# Patient Record
Sex: Male | Born: 1960
Health system: Southern US, Community
[De-identification: ages and names within clinical notes are randomized; demographics above are authoritative.]

## PROBLEM LIST (undated history)

## (undated) DIAGNOSIS — E119 Type 2 diabetes mellitus without complications: Secondary | ICD-10-CM

## (undated) DIAGNOSIS — I10 Essential (primary) hypertension: Secondary | ICD-10-CM

## (undated) DIAGNOSIS — G8929 Other chronic pain: Secondary | ICD-10-CM

## (undated) DIAGNOSIS — E785 Hyperlipidemia, unspecified: Secondary | ICD-10-CM

## (undated) DIAGNOSIS — R519 Headache, unspecified: Secondary | ICD-10-CM

## (undated) DIAGNOSIS — M797 Fibromyalgia: Secondary | ICD-10-CM

## (undated) DIAGNOSIS — F329 Major depressive disorder, single episode, unspecified: Secondary | ICD-10-CM

## (undated) DIAGNOSIS — I214 Non-ST elevation (NSTEMI) myocardial infarction: Secondary | ICD-10-CM

## (undated) DIAGNOSIS — R51 Headache: Secondary | ICD-10-CM

## (undated) DIAGNOSIS — I2510 Atherosclerotic heart disease of native coronary artery without angina pectoris: Secondary | ICD-10-CM

## (undated) DIAGNOSIS — F32A Depression, unspecified: Secondary | ICD-10-CM

## (undated) DIAGNOSIS — I213 ST elevation (STEMI) myocardial infarction of unspecified site: Secondary | ICD-10-CM

## (undated) DIAGNOSIS — M545 Low back pain, unspecified: Secondary | ICD-10-CM

## (undated) DIAGNOSIS — K219 Gastro-esophageal reflux disease without esophagitis: Secondary | ICD-10-CM

## (undated) HISTORY — DX: Fibromyalgia: M79.7

## (undated) HISTORY — PX: CORONARY STENT PLACEMENT: SHX1402

## (undated) HISTORY — DX: Headache: R51

## (undated) HISTORY — DX: Low back pain: M54.5

## (undated) HISTORY — PX: APPENDECTOMY: SHX54

## (undated) HISTORY — PX: NASAL SINUS SURGERY: SHX719

## (undated) HISTORY — DX: Other chronic pain: G89.29

## (undated) HISTORY — DX: Depression, unspecified: F32.A

## (undated) HISTORY — DX: Headache, unspecified: R51.9

## (undated) HISTORY — PX: SHOULDER SURGERY: SHX246

## (undated) HISTORY — DX: Atherosclerotic heart disease of native coronary artery without angina pectoris: I25.10

## (undated) HISTORY — DX: Major depressive disorder, single episode, unspecified: F32.9

## (undated) HISTORY — DX: Low back pain, unspecified: M54.50

## (undated) HISTORY — DX: Gastro-esophageal reflux disease without esophagitis: K21.9

## (undated) HISTORY — DX: Hyperlipidemia, unspecified: E78.5

## (undated) HISTORY — PX: KNEE SURGERY: SHX244

## (undated) HISTORY — PX: CARDIAC CATHETERIZATION: SHX172

---

## 2005-12-08 DIAGNOSIS — I2510 Atherosclerotic heart disease of native coronary artery without angina pectoris: Secondary | ICD-10-CM

## 2005-12-08 HISTORY — DX: Atherosclerotic heart disease of native coronary artery without angina pectoris: I25.10

## 2006-11-07 ENCOUNTER — Ambulatory Visit: Payer: Self-pay | Admitting: *Deleted

## 2006-11-07 ENCOUNTER — Inpatient Hospital Stay (HOSPITAL_COMMUNITY): Admission: EM | Admit: 2006-11-07 | Discharge: 2006-11-10 | Payer: Self-pay | Admitting: Internal Medicine

## 2006-11-22 ENCOUNTER — Observation Stay (HOSPITAL_COMMUNITY): Admission: AD | Admit: 2006-11-22 | Discharge: 2006-11-24 | Payer: Self-pay | Admitting: Cardiology

## 2006-11-22 ENCOUNTER — Ambulatory Visit: Payer: Self-pay | Admitting: Internal Medicine

## 2006-11-26 ENCOUNTER — Ambulatory Visit: Payer: Self-pay

## 2006-11-26 ENCOUNTER — Encounter: Payer: Self-pay | Admitting: Cardiology

## 2006-12-10 ENCOUNTER — Ambulatory Visit: Payer: Self-pay | Admitting: Cardiology

## 2007-06-15 ENCOUNTER — Ambulatory Visit: Payer: Self-pay | Admitting: Cardiology

## 2007-06-16 ENCOUNTER — Ambulatory Visit: Payer: Self-pay | Admitting: Cardiology

## 2007-12-09 HISTORY — PX: CHOLECYSTECTOMY: SHX55

## 2008-04-13 ENCOUNTER — Ambulatory Visit: Payer: Self-pay | Admitting: Cardiology

## 2008-04-14 ENCOUNTER — Encounter: Payer: Self-pay | Admitting: Cardiology

## 2008-04-18 ENCOUNTER — Ambulatory Visit: Payer: Self-pay | Admitting: Cardiology

## 2008-04-19 ENCOUNTER — Ambulatory Visit: Payer: Self-pay | Admitting: Cardiovascular Disease

## 2008-04-19 ENCOUNTER — Inpatient Hospital Stay (HOSPITAL_COMMUNITY): Admission: AD | Admit: 2008-04-19 | Discharge: 2008-04-20 | Payer: Self-pay | Admitting: Cardiology

## 2008-05-11 ENCOUNTER — Ambulatory Visit: Payer: Self-pay | Admitting: Cardiology

## 2008-05-18 ENCOUNTER — Ambulatory Visit (HOSPITAL_COMMUNITY): Payer: Self-pay | Admitting: Psychiatry

## 2008-06-09 ENCOUNTER — Encounter: Payer: Self-pay | Admitting: Cardiology

## 2008-06-18 ENCOUNTER — Encounter: Payer: Self-pay | Admitting: Cardiology

## 2008-06-19 ENCOUNTER — Ambulatory Visit (HOSPITAL_COMMUNITY): Admission: RE | Admit: 2008-06-19 | Discharge: 2008-06-19 | Payer: Self-pay | Admitting: Family Medicine

## 2008-06-20 ENCOUNTER — Encounter: Payer: Self-pay | Admitting: Cardiology

## 2008-07-05 ENCOUNTER — Encounter: Payer: Self-pay | Admitting: Cardiology

## 2008-07-06 ENCOUNTER — Ambulatory Visit (HOSPITAL_COMMUNITY): Payer: Self-pay | Admitting: Psychiatry

## 2008-07-11 ENCOUNTER — Ambulatory Visit (HOSPITAL_COMMUNITY): Payer: Self-pay | Admitting: Psychology

## 2008-08-10 ENCOUNTER — Ambulatory Visit (HOSPITAL_COMMUNITY): Payer: Self-pay | Admitting: Psychology

## 2008-09-08 ENCOUNTER — Ambulatory Visit (HOSPITAL_COMMUNITY): Payer: Self-pay | Admitting: Psychology

## 2009-05-19 ENCOUNTER — Encounter: Payer: Self-pay | Admitting: Cardiology

## 2009-06-15 DIAGNOSIS — I10 Essential (primary) hypertension: Secondary | ICD-10-CM | POA: Insufficient documentation

## 2009-06-15 DIAGNOSIS — I2510 Atherosclerotic heart disease of native coronary artery without angina pectoris: Secondary | ICD-10-CM

## 2009-06-15 DIAGNOSIS — R079 Chest pain, unspecified: Secondary | ICD-10-CM

## 2009-06-15 DIAGNOSIS — E785 Hyperlipidemia, unspecified: Secondary | ICD-10-CM

## 2009-06-15 DIAGNOSIS — F329 Major depressive disorder, single episode, unspecified: Secondary | ICD-10-CM

## 2009-06-15 DIAGNOSIS — IMO0001 Reserved for inherently not codable concepts without codable children: Secondary | ICD-10-CM

## 2009-12-24 ENCOUNTER — Ambulatory Visit: Payer: Self-pay | Admitting: Cardiology

## 2009-12-24 DIAGNOSIS — R1013 Epigastric pain: Secondary | ICD-10-CM | POA: Insufficient documentation

## 2009-12-24 DIAGNOSIS — F17200 Nicotine dependence, unspecified, uncomplicated: Secondary | ICD-10-CM | POA: Insufficient documentation

## 2010-07-30 ENCOUNTER — Ambulatory Visit (HOSPITAL_COMMUNITY): Admission: RE | Admit: 2010-07-30 | Discharge: 2010-07-30 | Payer: Self-pay | Admitting: General Surgery

## 2010-08-20 ENCOUNTER — Ambulatory Visit: Payer: Self-pay | Admitting: Cardiology

## 2010-10-10 ENCOUNTER — Telehealth (INDEPENDENT_AMBULATORY_CARE_PROVIDER_SITE_OTHER): Payer: Self-pay | Admitting: *Deleted

## 2010-10-11 ENCOUNTER — Encounter: Payer: Self-pay | Admitting: Cardiology

## 2011-01-07 NOTE — Progress Notes (Signed)
Summary: Office Visit  Office Visit   Imported By: Zachary George 12/21/2009 15:58:36  _____________________________________________________________________  External Attachment:    Type:   Image     Comment:   External Document

## 2011-01-07 NOTE — Letter (Signed)
Summary: Letter/ [facility]  Letter/ [facility]   Imported By: Dorise Hiss 10/22/2010 12:32:26  _____________________________________________________________________  External Attachment:    Type:   Image     Comment:   External Document

## 2011-01-07 NOTE — Assessment & Plan Note (Signed)
Summary: est last seen 6/09-srs  Medications Added ASPIRIN 81 MG TBEC (ASPIRIN) Take one tablet by mouth daily CRESTOR 10 MG TABS (ROSUVASTATIN CALCIUM) Take one tablet by mouth daily. METOPROLOL SUCCINATE 25 MG XR24H-TAB (METOPROLOL SUCCINATE) Take one tablet by mouth daily OMEPRAZOLE 20 MG CPDR (OMEPRAZOLE) Take 1 tablet by mouth two times a day HYDROCODONE-ACETAMINOPHEN 10-650 MG TABS (HYDROCODONE-ACETAMINOPHEN) as needed pain ADVIL 200 MG TABS (IBUPROFEN) as needed VOLTAREN 1 % GEL (DICLOFENAC SODIUM) as needed BC FAST PAIN RELIEF 650-195-33.3 MG PACK (ASPIRIN-SALICYLAMIDE-CAFFEINE) as needed      Allergies Added: AMPICILLIN  Visit Type:  Follow-up Primary Provider:  Dr. Donzetta Sprung  CC:  follow-up visit.  History of Present Illness: the patient is a 50 year old male with a history of coronary artery disease. He has a history of stent placement to the right coronary artery. He was hospitalized in May of 2009 for chest pain. The patient underwent a cardiac catheterization which showed a patent RCA stent and otherwise no obstructive disease. His ejection fraction is 50% with mild inferobasal hypokinesis. The patient is currently in NYHA class II. He denies any chest pain, orthopnea PND palpitations or syncope. Unfortunately continues to smoke up to a pack a day. He reports occasional epigastric pain which improves with eating something. He denies any presyncope or syncope.  Preventive Screening-Counseling & Management  Alcohol-Tobacco     Smoking Status: current     Packs/Day: 1.0     Year Started: 1975  Clinical Review Panels:  Echocardiogram Echocardiogram Overall left ventricular systolic function was normal.  Left ventricular ejection fraction was estimated, range being 55-65%. There were no left ventricular regional wall motion abnormalities. Left ventricular wall thickness was mildly increased.   Olga Millers, MD  11-26-2006 (11/26/2006)  Cardiac Imaging Cardiac  Cath Findings Widely patent right coronary right artery stent Plan: The etiology for the patients chest pain is not clear. He will continue with secondary risk reduction and further evaluation by his primary doctor.    Rollene Rotunda, MD [facility] (11/23/2006)    Current Problems (verified): 1)  Tobacco Abuse  (ICD-305.1) 2)  Epigastric Pain  (ICD-789.06) 3)  Depression  (ICD-311) 4)  Hypertension  (ICD-401.9) 5)  Dyslipidemia  (ICD-272.4) 6)  Fibromyalgia  (ICD-729.1) 7)  Coronary Atherosclerosis Native Coronary Artery  (ICD-414.01) 8)  Chest Pain  (ICD-786.50)  Current Medications (verified): 1)  Nitroglycerin 0.4 Mg Subl (Nitroglycerin) .... One Tablet Under Tongue Every 5 Minutes As Needed For Chest Pain---May Repeat Times Three 2)  Aspirin 81 Mg Tbec (Aspirin) .... Take One Tablet By Mouth Daily 3)  Crestor 10 Mg Tabs (Rosuvastatin Calcium) .... Take One Tablet By Mouth Daily. 4)  Metoprolol Succinate 25 Mg Xr24h-Tab (Metoprolol Succinate) .... Take One Tablet By Mouth Daily 5)  Omeprazole 20 Mg Cpdr (Omeprazole) .... Take 1 Tablet By Mouth Two Times A Day 6)  Hydrocodone-Acetaminophen 10-650 Mg Tabs (Hydrocodone-Acetaminophen) .... As Needed Pain 7)  Advil 200 Mg Tabs (Ibuprofen) .... As Needed 8)  Voltaren 1 % Gel (Diclofenac Sodium) .... As Needed 9)  Bc Fast Pain Relief 650-195-33.3 Mg Pack (Aspirin-Salicylamide-Caffeine) .... As Needed  Allergies (verified): 1)  Ampicillin  Comments:  Nurse/Medical Assistant: The patient's medications were reviewed with the patient and were updated in the Medication List. Pt brought medications to office visit.  Past History:  Past Surgical History: Last updated: 06/15/2009 Cath bare metal stenting of mid right coronary artery Dec.2007  Family History: Last updated: 06/15/2009 Father: alive 31 yrs CAD Mother: died of  CAD age 59  Social History: Last updated: 06/15/2009 Full Time Married  Tobacco Use - Yes.  Regular Exercise -  no Drug Use - no  Risk Factors: Smoking Status: current (12/24/2009) Packs/Day: 1.0 (12/24/2009)  Past Medical History: Current Problems:  DEPRESSION (ICD-311) HYPERTENSION (ICD-401.9) DYSLIPIDEMIA (ICD-272.4) FIBROMYALGIA (ICD-729.1) CORONARY ATHEROSCLEROSIS NATIVE CORONARY ARTERY (ICD-414.01) Nonobstructive coronary artery disease with widely patent right coronary artery stent 2009. Negative suboptimal exercise stress Cardiolite ejection fraction 52% in 2009. Single vessel coronary artery disease post non-ST segment myocardial infarction in bare-metal stent placement of the mid right coronary artery December 2007 Widely patent stent by relook catheterization 2 weeks later CHEST PAIN (ICD-786.50)  Social History: Packs/Day:  1.0  Review of Systems  The patient denies fatigue, malaise, fever, weight gain/loss, vision loss, decreased hearing, hoarseness, chest pain, palpitations, shortness of breath, prolonged cough, wheezing, sleep apnea, coughing up blood, abdominal pain, blood in stool, nausea, vomiting, diarrhea, heartburn, incontinence, blood in urine, muscle weakness, joint pain, leg swelling, rash, skin lesions, headache, fainting, dizziness, depression, anxiety, enlarged lymph nodes, easy bruising or bleeding, and environmental allergies.         epigastric pain improves with eating. No shortness of breath.  Vital Signs:  Patient profile:   50 year old male Height:      70 inches Weight:      216.50 pounds BMI:     31.18 Pulse rate:   84 / minute BP sitting:   168 / 83  (left arm) Cuff size:   regular  Vitals Entered By: Cyril Loosen, RN, BSN (December 24, 2009 10:38 AM)  Nutrition Counseling: Patient's BMI is greater than 25 and therefore counseled on weight management options. CC: follow-up visit   Physical Exam  Additional Exam:  General: Well-developed, well-nourished in no distress head: Normocephalic and atraumatic eyes PERRLA/EOMI intact, conjunctiva  and lids normal nose: No deformity or lesions mouth normal dentition, normal posterior pharynx neck: Supple, no JVD.  No masses, thyromegaly or abnormal cervical nodes lungs: Normal breath sounds bilaterally without wheezing.  Normal percussion heart: regular rate and rhythm with normal S1 and S2, no S3 or S4.  PMI is normal.  No pathological murmurs abdomen: Normal bowel sounds, abdomen is soft and nontender without masses, organomegaly or hernias noted.  No hepatosplenomegaly musculoskeletal: Back normal, normal gait muscle strength and tone normal pulsus: Pulse is normal in all 4 extremities Extremities: No peripheral pitting edema neurologic: Alert and oriented x 3 skin: Intact without lesions or rashes cervical nodes: No significant adenopathy psychologic: Normal affect     EKG  Procedure date:  12/24/2009  Findings:      normal sinus rhythm. Heart rate 83 beats per minute. QRS duration 78 ms. No definite ST-T wave changes.  EKG  Procedure date:  12/24/2009  Findings:      normal sinus rhythm. Normal electrocardiogram. Heart rate 80 beats per minute.  Impression & Recommendations:  Problem # 1:  CORONARY ATHEROSCLEROSIS NATIVE CORONARY ARTERY (ICD-414.01) I encouraged exercise. The patientpurchased a treadmill test recently. He reports no chest pain while exercising. His updated medication list for this problem includes:    Nitroglycerin 0.4 Mg Subl (Nitroglycerin) ..... One tablet under tongue every 5 minutes as needed for chest pain---may repeat times three    Aspirin 81 Mg Tbec (Aspirin) .Marland Kitchen... Take one tablet by mouth daily    Metoprolol Succinate 25 Mg Xr24h-tab (Metoprolol succinate) .Marland Kitchen... Take one tablet by mouth daily  Orders: EKG w/ Interpretation (93000)  Problem #  2:  HYPERTENSION (ICD-401.9) blood pressure is poorly controlled. However the last clinic visit blood pressure was within normal limits. The patient states that his blood pressure is around  140-150 mmHg systolic at [employer]. The last the patient to recheck his blood pressure several occasions and it remains elevated we will start a low dose ACE inhibitor. His updated medication list for this problem includes:    Aspirin 81 Mg Tbec (Aspirin) .Marland Kitchen... Take one tablet by mouth daily    Metoprolol Succinate 25 Mg Xr24h-tab (Metoprolol succinate) .Marland Kitchen... Take one tablet by mouth daily  Problem # 3:  TOBACCO ABUSE (ICD-305.1) I counseled the patient regarding his tobacco use. Advised him to discontinue tobacco.  Problem # 4:  EPIGASTRIC PAIN (ICD-789.06) improves with eating something. I suspect the patient has some gastritis but no evidence of angina.  Problem # 5:  DYSLIPIDEMIA (ICD-272.4) the patient is currently on Crestor. He resembled were performed. He will follow up with his primary care physician. His updated medication list for this problem includes:    Crestor 10 Mg Tabs (Rosuvastatin calcium) .Marland Kitchen... Take one tablet by mouth daily.  Patient Instructions: 1)  Your physician recommends that you continue on your current medications as directed. Please refer to the Current Medication list given to you today. 2)  Follow up in 1 year.

## 2011-01-07 NOTE — Progress Notes (Signed)
Summary: hold asa    Phone Note Other Incoming   Caller: FAX FROM [facility] Summary of Call: Wants to know if pt can d/c ASA prior to surgery.  Right shoulder surgery planned for 11/8 with Dr. Amanda Pea.  Patient has not been seen since 12-24-09, but is not due to be seen again till 12/2010 (year f/u). Hoover Brunette, LPN  October 10, 2010 3:24 PM   Follow-up for Phone Call        yes should be able to stop aspirin 5 days before surgery but to resume immediately back after surgery. The patient has a bare metal stent which was placed in 2007 so he would be at very low risk for stent thrombosis. Follow-up by: Lewayne Bunting, MD, [facility],  October 11, 2010 9:52 AM  Additional Follow-up for Phone Call Additional follow up Details #1::        Will fax note to above.   Hoover Brunette, LPN  October 11, 2010 2:29 PM

## 2011-01-07 NOTE — Consult Note (Signed)
Summary: CARDIOLOGY CONSULT/ [facility]  CARDIOLOGY CONSULT/ [facility]   Imported By: Zachary George 12/21/2009 16:00:56  _____________________________________________________________________  External Attachment:    Type:   Image     Comment:   External Document

## 2011-04-22 NOTE — Assessment & Plan Note (Signed)
[facility]                          [facility] NOTE   NAME:Howard Nelson, KOHL POLINSKY                         MRN:          [id number]  DATE:05/11/2008                            DOB:          20-Mar-1961    PRIMARY CARDIOLOGIST:  Learta Codding, M.D., [facility]   REASON FOR VISIT:  Post-hospital followup.   Howard Nelson is a 50 year old male, well-known to [place], with longstanding  history of CAD.  He recently was rehospitalized here at [facility] with  recurrent chest pain, which we felt was atypical and for which he had  normal cardiac markers on both occasions.  He also had a normal exercise  stress test following his first admission.  Given his recurrent  symptoms, however, recommendation was to transfer him directly to [facility] so as to proceed with a cardiac catheterization.   This revealed a widely patent RCA stent site with otherwise  nonobstructive CAD.  EF was 50% with mild inferobasal hypokinesis.   The patient was discharged on his usual home medication regimen.   He presents to the clinic today reporting no significant change in his  symptomatology.  He states that he was recently placed on a proton pump  inhibitor, but has not noted any palpable improvement.  He continues to  smoke, albeit down from 3 packs to 1/2 pack a day.   The patient sites fibromyalgia as the reason for his inability to work.  As previously noted, he is seeking long-term disability.   The patient denies any complications of the right groin incision site.   Electrocardiogram today reveals NSR at 69 bpm with normal axis and no  ischemic changes.   MEDICATIONS:  1. Full-dose aspirin.  2. Simvastatin 80 daily.  3. Metoprolol 25 daily.  4. Fluoxetine 40 daily.  5. Omeprazole 20 daily.  6. Gabapentin 300 b.i.d.   PHYSICAL EXAMINATION:  Blood pressure 127/74, pulse 77 and regular.  Weight 215.6.  GENERAL:  A 50 year old male sitting upright, no distress.  HEENT:   Normocephalic, atraumatic.  NECK:  Palpable bilateral carotid pulses without bruits; no JVD at 90  degrees.  LUNGS:  Clear to auscultation bilaterally.  HEART:  Regular rate and rhythm (S1 and S2), no significant murmurs.  Positive S4.  ABDOMEN:  Soft, nontender.  EXTREMITIES:  Right groin stable with no ecchymosis, hematoma, or bruit  on auscultation; palpable femoral and right posterior tibialis pulse.  NEURO:  Flat affect, but no focal deficit.   IMPRESSION:  1. Recurrent, atypical chest pain.      a.     Nonobstructive coronary artery disease with widely patent       right coronary artery stent site, by recent cardiac       catheterization.      b.     Recent negative suboptimal exercise stress Cardiolite; EF       52%.  2. Single-vessel coronary artery disease.      a.     Status post non-ST-segment myocardial infarction/bare metal       stenting of mid right coronary artery, December  2007.      b.     Widely patent stent site by relook catheterization, 2 weeks       later.      c.     Negative routine treadmill test, July 2008.  3. Chest pain syndrome.  4. Fibromyalgia.  5. Hypertension.  6. Dyslipidemia.  7. Ongoing tobacco.  8. Depression.   PLAN:  1. Reassurance with respect to Mr. Mccreadie longstanding history of      recurrent, noncardiac chest pain, particularly in light of most      recent findings by relook cardiac catheterization.  2. The patient once again strongly advised to stop smoking tobacco.  3. Aggressive lipid management, per Dr. Reuel Boom, with target LDL of 70,      or less.  4. Prescribe p.r.n. nitroglycerin.  5. Schedule return clinic followup with Dr. Andee Lineman in 1 year, or      sooner as needed.      Gene Serpe, PA-C  Electronically Signed      Learta Codding, MD,FACC  Electronically Signed   GS/MedQ  DD: 05/11/2008  DT: 05/11/2008  Job #: [id number]   cc:   Donzetta Sprung

## 2011-04-22 NOTE — Discharge Summary (Signed)
NAME:  Howard Nelson, Howard Nelson NO.:  [id number]   MEDICAL RECORD NO.:  [id number]          PATIENT TYPE:  INP   LOCATION:  3706                         FACILITY:  [facility]   PHYSICIAN:  Veverly Fells. Excell Seltzer, MD  DATE OF BIRTH:  Jan 04, 1961   DATE OF ADMISSION:  04/19/2008  DATE OF DISCHARGE:  04/20/2008                               DISCHARGE SUMMARY   PRIMARY CARDIOLOGIST:  Learta Codding, MD, [facility].   PRIMARY CARE Mayara Paulson:  Dr. Donzetta Sprung   DISCHARGE DIAGNOSIS:  Chest pain.   SECONDARY DIAGNOSES:  1. Coronary artery disease, status post bare metal stenting in the      right coronary artery in 2007.  2. Fibromyalgia.  3. Hypertension.  4. Hyperlipidemia.  5. Depression.  6. Guillain-Barre Syndrome.  7. Degenerative joint disease of the lumbar spine.  8. History of fatty liver.   ALLERGIES:  AMPICILLIN.   PROCEDURES:  Left heart cardiac catheterization.   HISTORY OF PRESENT ILLNESS:  A 50 year old Caucasian male with prior  history of CAD, status post bare metal stenting of the RCA in 2007.  He  has had a long history of atypical chest pain.  Since his stenting,  however, this has increased in intensity and frequency, prompting him to  present to the [facility] ER on Apr 13, 2008.  There, he was ruled out for  MI and ECG showed no acute changes.  Cardiology was consulted, and the  patient underwent an exercise Cardiolite stress testing, which was  negative for ischemia.  Unfortunately, the patient had recurrent chest  pain on Apr 18, 2008, and represented to [place].  Decision was made at  this point to transfer him to [facility] for evaluation and  catheterization.  The patient did rule out for MI at [facility].   HOSPITAL COURSE:  The patient underwent left heart cardiac  catheterization this morning, Apr 20, 2008, showing a widely patent RCA  stent and otherwise nonobstructive CAD.  EF was 50% with mild  inferobasal hypokinesis.  The patient has been ambulating  post-  catheterization.  He has not had any recurrent chest discomfort.  We  made no changes to his current medical regimen, and he will be  discharged home today in good condition.   DISCHARGE LABS:  None.   DISPOSITION:  The patient is being discharged to home today in good  condition.   FOLLOWUP PLANS AND APPOINTMENTS:  He is to follow with Dr. Andee Lineman on May 05, 2008, at 01:30 p.m..  He is to follow up with Dr. Reuel Boom as  previously scheduled.   DISCHARGE MEDICATIONS:  1. Aspirin 325 mg q.d.  2. Simvastatin 80 mg q.d.  3. Fluoxetine 40 mg q.d.  4. Omeprazole 20 mg q.d.  5. Gabapentin 300 mg b.i.d.  6. Toprol-XL 25 mg q.d.  7. Nitroglycerin 0.4 mg sublingual p.r.n. chest pain.   OUTSTANDING LABORATORY STUDIES:  None.   DURATION OF DISCHARGE ENCOUNTER:  40 minutes including physician time.      Nicolasa Ducking, ANP      Veverly Fells. Excell Seltzer, MD  Electronically Signed    CB/MEDQ  D:  04/20/2008  T:  04/21/2008  Job:  [id number]   cc:   Donzetta Sprung

## 2011-04-22 NOTE — Assessment & Plan Note (Signed)
[facility]                          [facility] NOTE   NAME:Galbreath, NOELLE HOOGLAND                         MRN:          [id number]  DATE:06/15/2007                            DOB:          Apr 18, 1961    HISTORY OF PRESENT ILLNESS:  The patient is a white male with history of  coronary artery disease status post PCI stenting of the right coronary  artery November 09, 2006.  The patient had repeat catheterization in late  December that showed a widely patent stent.  The patient stated that he  has been doing well.  He reports no orthopnea, PND.  No palpitations or  syncope.  He reports some atypical chest pain.   MEDICATIONS:  1. Simvastatin 80 mg daily.  2. Plavix 75 mg daily.  3. Metoprolol 25 mg p.o. daily.  4. Aspirin 325 mg daily.  5. Fluoxetine 40 mg daily.   PHYSICAL EXAMINATION:  VITAL SIGNS:  Blood pressure 123/77, Altace 71,  weight 201 pounds.  NECK:  Normal carotid upstrokes.  No carotid bruits.  LUNGS:  Clear.  Breath sounds normal.  HEART:  Rate regular rate and rhythm.  Normal S1, S2.  No murmurs, rubs  or gallops.  ABDOMEN:  Soft, nontender.  No rebound or guarding.  EXTREMITIES:  No cyanosis, clubbing or edema.   PROBLEMS:  1. Coronary artery disease.      a.     Status post PCI and stenting in the right coronary artery.      b.     Renal catheterization November 23, 2006.  2. Dyslipidemia.  3. Extensive tobacco use.  4. Chronic chest pain, atypical.  5. Depression.  6. Shoulder surgery.   PLAN:  1. The patient continues to have atypical chest pain.  Will plan on      GXT testing in the next couple of days.  2. The patient can follow up with [place] in the office.     Learta Codding, MD,FACC  Electronically Signed   GED/MedQ  DD: 06/15/2007  DT: 06/16/2007  Job #: [id number]

## 2011-04-25 NOTE — Cardiovascular Report (Signed)
NAME:  STOCKTON, NUNLEY NO.:  [id number]   MEDICAL RECORD NO.:  [id number]          PATIENT TYPE:  INP   LOCATION:  6533                         FACILITY:  [facility]   PHYSICIAN:  Veverly Fells. Excell Seltzer, MD  DATE OF BIRTH:  [birth year]   DATE OF PROCEDURE:  11/09/2006  DATE OF DISCHARGE:  11/10/2006                            CARDIAC CATHETERIZATION   PROCEDURE:  Left heart catheterization, selective coronary angiography,  left ventricular angiography, PTCA and stenting of the right coronary  artery and Angio-Seal of the right femoral artery.   INDICATIONS:  Mr. Northup is a 50 year old male who presented with a  subendocardial MI.  He was treated with the Enoxaparin and Integralin  and became pain free.  He was subsequently referred for diagnostic  cardiac catheterization.  He had a left heart catheterization  approximately 10 years ago that was normal by his report.   Procedural details, risks and indications of the procedure were  explained in detail to the patient.  Informed consent was obtained.  Right groin was prepped, draped and anesthetized with 1% lidocaine.  Using a modified Seldinger technique, a 6-French femoral arterial sheath  was placed.  Angiographic views of the left and right coronary arteries  were taken with a 6-French JL-4 catheter and 6-French JR-4 catheter  respectively.  Following native coronary angiography, an angled pigtail  catheter was inserted into the left ventricle, and pressures were  recorded.  A RAO ventriculogram was done.  A pullback across the aortic  valve was performed.   At the conclusion of the diagnostic procedure, PCI of the right coronary  artery was performed.  The patient had a 95% lesion in the mid-right  coronary artery, at the site of a RV marginal branch.  This clearly was  his culprit, as he had only modest disease in his left coronary artery.  The patient was on Integralin.  He was given 50 units per kg of Heparin.  Once  a therapeutic ACT was achieved, a 6-French JR-4 guiding catheter  was inserted.  A Cougar guidewire was inserted into the PDA branch of  the right coronary artery beyond the lesion.  The lesion was pre-dilated  with a 2.5 x 15-mm Maverick balloon up to 10 atmospheres.  Following pre-  dilatation, a 3.5 x 20- mm Liberte stent was deployed at 18 atmospheres.  There was excellent stent expansion with TIMI III flow throughout the  vessel.  Following stent deployment, the stent appeared to be very  slightly undersized, and a 4.0 x 15 Quantum Maverick was inserted and  inflated at 20 atmospheres.  There was excellent stent expansion with  TIMI III flow throughout the main branch of the right coronary artery.  The RV marginal branch was jailed, following the stent.  There was TIMI  II flow in this vessel.  As he had an excellent main branch result, I  was elected not to treat the side branch with any mechanical measures.  The patient did have TIMI II flow in the vessel, and I suspect he will  not have any significant problems  with this.  He was given intracoronary  nitroglycerin and repeated angiographic views were performed.   At the conclusion of the case, a 6-French Angio-Seal was used to seal  the arteriotomy in the right femoral artery.   FINDINGS:  Aortic pressure 112/71 with mean of 89.  Left ventricular  pressures 107/4 with an end-diastolic pressure of 16.   CORONARY ANGIOGRAPHY:  The left main stem is angiographically normal and  bifurcates into the LAD and left circumflex.   The LAD is a large-caliber vessel that courses down the left ventricular  apex.  It has no significant angiographic disease throughout it's  course.  There are two diagonal branches, the first of which is a large  caliber.  There is a 50% ostial stenosis of the first diagonal.  The  second diagonal is medium caliber and is angiographically normal.   The left circumflex system is medium caliber.  It gives off  two marginal  branches.  The first marginal is medium caliber and has a 50% ostial  stenosis.  There is normal flow throughout that vessel.  Second obtuse  marginal is angiographically normal.  The true AV groove circumflex is  small caliber.   The right coronary artery is a large dominant vessel.  It is tortuous in  its proximal portion.  It gives off a large PDA branch distally.  It  gives off a posterior AV segment that has four posterolateral branches.  The mid right coronary artery has a severe focal stenosis of the least  95%.  There is an RV marginal branch that arises from this region of  high-grade stenosis.  There is TIMI III flow throughout the vessel.   Left ventriculogram demonstrates low normal left ventricular function  with an LVEF of 55%.   ASSESSMENT:  1. Severe single-vessel coronary artery disease with high-grade focal      stenosis of the mid-right coronary artery.  2. Normal left ventricular function.   PLAN:  As described above, PCI of the mid-right coronary artery was  performed with a Liberte bare metal stent.  This was post-dilated with a  4-0 noncompliant balloon, with an excellent angiographic result.  The RV  marginal branch did have compromised flow following the procedure.  The  RV marginal branch is approximately a 2.0-mm vessel, and I elected not  to mechanically intervene on this vessel.  Will place Mr.Voris on a  nitroglycerin drip and will treat him medically.  I do not think he will  have long-term sequelae from his compromised RV marginal branch, and his  right coronary artery is a very large vessel that I did not want to risk  with treating the side branch.  He has been pretreated with clopidogrel.  He should receive dual anti-platelet therapy with aspirin and  clopidogrel.  He needs indefinite aspirin and clopidogrel for a minimum  of 30 days.  He would benefit from 1 year of clopidogrel, based on the  cure data for non-stemi.     Veverly Fells. Excell Seltzer, MD  Electronically Signed     MDC/MEDQ  D:  11/09/2006  T:  11/10/2006  Job:  [id number]

## 2011-04-25 NOTE — Cardiovascular Report (Signed)
NAME:  RAMARI, BRAY NO.:  [id number]   MEDICAL RECORD NO.:  [id number]          PATIENT TYPE:  INP   LOCATION:  3731                         FACILITY:  [facility]   PHYSICIAN:  Rollene Rotunda, MD, FACCDATE OF BIRTH:  1961-08-14   DATE OF PROCEDURE:  11/23/2006  DATE OF DISCHARGE:                            CARDIAC CATHETERIZATION   PROCEDURE:  Coronary angiogram.   INDICATIONS:  Chest pain and coronary disease, with a recent stenting.   PROCEDURAL NOTE:  Coronary angiography was performed via the right  femoral artery.  The aorta was cannulated using anterior wall puncture.  A #4-French arterial sheath was inserted via the modified Seldinger  technique.  Preformed Judkins catheters were utilized.  The patient  tolerated the procedure well and left the lab in stable condition.   RESULTS:   HEMODYNAMICS:  AO 102/86.   CORONARIES:  The left main was normal.  The LAD had ostial 25% stenosis.  There was a mid 25% stenosis.  There was a moderate-size first diagonal  which had ostial 25-30% stenosis.  Second diagonal was small and normal.  The circumflex essentially had a very large branching OM1.  There was  some proximal stenosis of 25%.  The right coronary artery is a large  dominant vessel.  There were diffuse proximal luminal irregularities.  There was a stented area across an acute marginal.  This was widely  patent.  The acute marginal was also patent.  There was a large PDA  which was normal.   LEFT VENTRICLE:  I did not cross the AO and did not do an LV injection.   CONCLUSION:  Widely patent right coronary artery stent.   PLAN:  The etiology for the patient's chest pain is not clear.  He will  continue with secondary risk reduction and further evaluation by his  primary care doctor.      Rollene Rotunda, MD, [facility]  Electronically Signed     JH/MEDQ  D:  11/23/2006  T:  11/24/2006  Job:  [id number]

## 2011-04-25 NOTE — H&P (Signed)
NAME:  Howard Nelson, Howard Nelson NO.:  [id number]   MEDICAL RECORD NO.:  [id number]          PATIENT TYPE:  INP   LOCATION:  2928                         FACILITY:  [facility]   PHYSICIAN:  Audery Amel, MD    DATE OF BIRTH:  Jun 30, 1961   DATE OF ADMISSION:  11/07/2006  DATE OF DISCHARGE:                              HISTORY & PHYSICAL   This patient was transferred from [place].   CHIEF COMPLAINT:  Chest pain radiating to bilateral arms.   HISTORY OF PRESENT ILLNESS:  The patient is a 50 year old white male  with past medical history notable for hyperlipidemia and tobacco abuse.  He was transferred for further evaluation of chest pain.  The patient  states that the pain started this a.m. after awakening at approximately  11 a.m.  He characterizes it as left-sided sharp chest pain that  radiated to his bilateral arms.  The patient also notes tingling in his  bilateral hands.  The patient took 2 Vicodin without improvement.  He  called his primary care physician who advised him to take one of his  methadone.  Again, this failed to improve his discomfort.  The pain  stuttered off and on throughout the day, and he eventually presented to  [facility] Emergency Department this evening for evaluation.  On arrival  there, his EKG was without any evidence of acute injury.  His initial  cardiac biomarkers were somewhat interesting in that his CK-MB was  normal with a troponin of 0.4 which is positive by their scale.  He  continued to have chest pain and was initiated on a nitroglycerin drip  which did somewhat help his pain.  He was transferred to [facility] for  further evaluation and management.   On arrival to the [facility] CCU, the patient is hemodynamically stable  with continued chest pain of 1/10 which is significantly improved.  Otherwise, he is without complaint.   PAST MEDICAL HISTORY:  1. Hyperlipidemia.  2. Depression.  3. Status post shoulder surgery 3 months  previous to this      presentation.  4. Chronic pain (diffuse) treated with Vicodin and methadone.  5. Left heart catheterization approximately 8 years ago interpreted as      normal as per the patient.   ALLERGIES:  AMPICILLIN.   MEDICATIONS:  1. Zocor 40 mg daily.  2. Doxycycline 100 mg b.i.d.  3. Methadone 5 mg b.i.d.  4. Lyrica 50 mg daily.  5. Vicodin 5 mg/500 p.o. q. 4 h p.r.n.  6. Effexor XR 150 mg 1 tablet b.i.d.   SOCIAL HISTORY:  The patient lives in [place] with his wife.  He  is a Curator by profession.  He endorses 45 pack years of smoking.  He  denies alcohol or illicit substance use.   FAMILY HISTORY:  His mother died from a myocardial infarction at the age  of 44 status post CABG.  His father is 7, alive and well, with diabetes  and CAD status post PCI.  He has one sister who died in a motor vehicle  accident  and one sister who has suffered 2 cerebrovascular accidents.   REVIEW OF SYSTEMS:  No fevers, chills, sweats, or adenopathy.  HEENT:  No headache, sore throat, nasal discharge, change in visual or auditory  acuity. SKIN: No rash or lesion.  CARDIOPULMONARY: As per HPI.  GU: No  frequency, urgency, or dysuria. NEUROPSYCH: No weakness, numbness, mood  disturbance.  MUSCULOSKELETAL:  Diffuse myalgias and arthralgias which  are chronic.  GI: No nausea, vomiting, diarrhea, bright red blood per  rectum, melena, hematemesis, dysphagia, GERD.  ENDOCRINE:  No polyuria,  no polydipsia, no heat or cold intolerance.  All other Review of Systems  are negative.   PHYSICAL EXAMINATION:  VITAL SIGNS: Blood pressure 101/65, heart rate  82, O2 saturation 99% on 2 liters nasal cannula.  GENERAL: Well-developed, well-nourished, no acute distress, pleasantly  conversant.  HEENT: Normocephalic and atraumatic.  EOMI.  PERRLA.  Nares patent.  Oropharynx clear without erythema or exudate.  NECK:  Supple, full range of motion, no JVD.  There is no palpable  thyromegaly.   Carotid upstrokes are equal and symmetric bilaterally  without bruits.  LYMPHADENOPATHY:  None.  CARDIOVASCULAR:  Normal S1 and S2 without audible murmurs, rubs, or  gallops.  LUNGS: Clear to auscultation bilaterally.  SKIN: Reveals no rashes or lesions.  He does have a surgical scar over  his left shoulder.  ABDOMEN: Soft, nontender, nondistended.  Positive bowel sounds.  No  hepatosplenomegaly.  GU: Normal male genitalia.  EXTREMITIES: No clubbing, cyanosis, or edema.  MUSCULOSKELETAL: No joint deformity or effusion.  As mentioned  previously, there is a scar over his left shoulder.  NEUROLOGIC: Alert and oriented x3.  Strength and sensation grossly  intact throughout.   EKG: Normal sinus rhythm with nonspecific T wave changes and no evidence  of acute injury or ischemia.   Labs from outside hospital: White count 8.1, hematocrit 43, platelet  count 239.  Sodium 135, potassium 4.1, chloride 103, CO2 25, BUN 13,  creatinine 0.9, glucose 112.  CK-MB 3.6, troponin I 0.4.   IMPRESSION:  1. Chest pain, rule out myocardial infarction.  2. Hyperlipidemia.  3. Chronic pain syndrome.  4. Depression.   PLAN:  The patient has both typical and atypical features with regards  to his chest discomfort.  It is somewhat concerning with regards to his  family history and his mother passing from myocardial infarction at age  24.  In addition, the initial cardiac biomarkers at the outside hospital  revealed a troponin of 0.4 which is positive by their scale.  Curiously,  his CK-MB is negative.  His EKG is without any evidence of acute  ischemia.  The patient will be admitted to a step-down unit for further  evaluation and management.  We will cycle cardiac biomarkers q. 8 h x3.  The patient was treated with Lovenox at the outside hospital, and we  will continue with this therapy.  He was also given a dose of aspirin  which we will continue daily.  We will initiate a IIB/IIIA inhibitor should  the patient rule in  for myocardial infarction.  We will check his fasting lipid profile and  continue him on simvastatin 40 mg once daily.  Given his risk factors,  he would certainly seem to be a high-risk patient and would likely  benefit from diagnostic heart catheterization for further evaluation.      Audery Amel, MD  Electronically Signed     SHG/MEDQ  D:  11/08/2006  T:  11/08/2006  Job:  [id number]

## 2011-04-25 NOTE — H&P (Signed)
NAME:  CORTLAND, CREHAN NO.:  [id number]   MEDICAL RECORD NO.:  [id number]          PATIENT TYPE:  INP   LOCATION:  3731                         FACILITY:  [facility]   PHYSICIAN:  Pricilla Riffle, MD, FACCDATE OF BIRTH:  [birth year]   DATE OF ADMISSION:  11/22/2006  DATE OF DISCHARGE:                              HISTORY & PHYSICAL   IDENTIFICATION:  Mr. Guiffre is a 50 year old gentleman with a history of  CAD.  He was recently admitted to [facility] and underwent placement  of a bare metal stent to the RCA.  He was just discharged on December 4.  Again, he was admitted for chest pain to the medicine service.  Cardiac  catheterization was on December 3, this showed a large LAD without  significant disease.  There was a 50% ostial lesion in the D1.  The left  circumflex was a medium caliber vessel.  First OM had a 50% ostial  stenosis.  AV groove artery with spa.  The RCA was dominant, it was  tortuous in its proximal portion and gave rise to a large PDA distally.  PLSA had 4 segments, the mid RCA had a focal tight stenosis of at least  95%.  Patient underwent PTCA and bare metal stent placement.  He was  sent home on aspirin and Plavix.  Note, he ruled in for a myocardial  infarction, though I do not have the numbers for this.   The patient was discharged home.  He said that even after discharge at  home, he was having a little bit of pain with exertion.  When he  returned to work on the 10th of December, he began having episodes,  usually mild, less than 3/10, he would not stop work for these and kept  working.  One episode was 5/10, he stopped work, it did not really  completely resolve for several hours.  Note some dizziness and fatigue.  At one point had to sit down.  No syncope and no palpitations.  These  episodes were associated with exertion.   Denied PND.   ALLERGIES:  AMPICILLIN.   MEDICATIONS:  1. Aspirin 325.  2. Plavix 75.  3. Lopressor 25 b.i.d.  4. Zocor 80 daily.  5. Effexor-XR 150 b.i.d.  6. Vicodin.  7. Methadone 5 p.r.n.  8. Chantix b.i.d.  9. Trazodone q.h.s.   PAST MEDICAL HISTORY:  1. CAD as noted.  2. Dyslipidemia.  3. Chronic pain with history of fibromyalgia.  4. Depression.   SOCIAL HISTORY:  The patient lives at home with his wife.  Quit tobacco  2 weeks ago after a 45 pack year.  Does not drink.   FAMILY HISTORY:  Mother died of coronary artery disease, had an MI and  CABG, died at 53.  Father is alive at age 20 with coronary disease.  One  sister is alive with CVA.   REVIEW OF SYSTEMS:  No sweats.  Chest pain as noted.  Dizziness.  No  syncope as noted.  Otherwise, all systems reviewed.  Negative, except  for the above problem,  except as noted.  Patient denies GE reflux.   PHYSICAL EXAMINATION:  GENERAL:  On exam, patient is currently pain  free, I just woke him from a nap.  VITAL SIGNS:  Blood pressure 129/83.  Pulse is 87.  Temperature is 98.4.  O2 saturation, on 2 liters, 97%.  HEENT:  Normocephalic, atraumatic.  PERRL.  EOMI.  Throat clear.  Nares  clear.  NECK:  JVP is normal.  No thyromegaly or bruits.  CARDIAC EXAM:  Regular rate and rhythm.  S1 and S2.  No S3.  Grade 1/6  systolic murmur.  LUNGS:  Clear to auscultation.  No rales.  ABDOMINAL EXAM:  Supple.  Nontender.  No hepatomegaly.  No masses.  EXTREMITIES:  Good distal pulses throughout.  No lower extremity edema.  Feet warm.   Chest x-ray:  No acute disease, done at [facility].   Twelve-lead EKG shows normal sinus rhythm at 73 beats per minute.  T-  wave inversion in lead 3 and F.   LABS:  Significant for a hemoglobin of 14.2, WBC of 7.1, BUN and  creatinine of 18 and 0.9, potassium of 3.7.  Initial troponin 0.01, CK-  MB of 83 and 1.3.   IMPRESSION:  Patient is a 50 year old, status post recent intervention  after an non-Q wave myocardial infarction, had 95% mid right coronary  artery lesion.  Bare metal stent was placed.  He has  been on aspirin and  Plavix.  He has had intermittent pain since he left the hospital.  It is  unusual, at this time, to have such a problem.  Would not expect  renarrowing without full occlusion.   For now, would recommend medical therapy, rule out for myocardial  infarction.  If enzymes are negative, we will need to discuss with  Tonny Bollman, question noninvasive evaluation at most.  If enzymes  become positive, of course cardiac catheterization.   We will check fasting lipid and follow hemoglobin A1c.   Continue other meds for dyslipidemia.      Pricilla Riffle, MD, [facility]  Electronically Signed     PVR/MEDQ  D:  11/22/2006  T:  11/23/2006  Job:  [id number]

## 2011-04-25 NOTE — Discharge Summary (Signed)
NAME:  Howard Nelson, Howard Nelson NO.:  [id number]   MEDICAL RECORD NO.:  [id number]          PATIENT TYPE:  INP   LOCATION:  6533                         FACILITY:  [facility]   PHYSICIAN:  Veverly Fells. Excell Seltzer, MD  DATE OF BIRTH:  [birth year]   DATE OF ADMISSION:  11/07/2006  DATE OF DISCHARGE:  11/10/2006                               DISCHARGE SUMMARY   PRIMARY CARDIOLOGIST:  Dr. Tonny Bollman.   PRINCIPAL DIAGNOSIS:  Non-ST-elevation myocardial infarction.   SECONDARY DIAGNOSES:  1. Coronary artery disease.  2. Hyperlipidemia.  3. Chronic pain syndrome.  4. Depression.  5. History of shoulder surgery.  6. Tobacco abuse.   ALLERGIES:  AMPICILLIN.   PROCEDURES:  Left heart cardiac catheterization with successful PCI and  stenting of the mid-right coronary artery with placement of a 3.5 x 20  mm Liberte bare-metal stent.   HISTORY OF PRESENT ILLNESS:  This 50 year old Caucasian male with prior  history of tobacco abuse and hyperlipidemia who was in his usual state  of health until the morning of November 07, 2006 began to experience  sharp left-sided chest pain, duration to bilateral arms that was not  improved with Vicodin or methadone, both of which he takes at home.  He  presented to [facility] in [place] and his troponin was noted to be  elevated at 0.4 with a normal CK-MB.  Decision was made to transfer him  to [facility] for further evaluation.   HOSPITAL COURSE:  The patient's CK 291, MB at 21.9, troponin I of 3.05.  He remained chest painfree over the weekend and underwent left heart  cardiac catheterization on November 09, 2006 revealing a 95% stenosis in  the mid-right coronary artery with otherwise nonobstructive coronary  artery disease.  EF was 55%.  He then underwent successful PCI and  stenting of the mid-RCA with placement of 3.5 x 20 mm Liberte bare-metal  stent performed by Dr. Excell Seltzer.  Postprocedure, he has not had any  recurrent chest discomfort,  has been ambulating without difficulty or  limitations.  He will be discharged home today in satisfactory  condition.   DISCHARGE LABS:  Hemoglobin 14.9, hematocrit 42.3, WBC 8.3, platelets  230, MCV 92.  Sodium 140, potassium 3.6, chloride 104, CO2 31, BUN 8,  creatinine 0.8, glucose 101.  PT 14.0, INR 1.1, PTT 38.  CK 195, MB  12.0, total cholesterol 170, triglycerides 203, HDL 32, LDL 97, calcium  8.9, hemoglobin A1c 5.4.  Urine drug screen was positive for opiates.   DISPOSITION:  The patient is being discharged home today in good  condition.   FOLLOW-UP PLANS AND APPOINTMENTS:  He has a follow-up appointment Dr.  Tonny Bollman at [facility] at [facility] office in  approximately two weeks.   DISCHARGE MEDICATIONS:  1. Aspirin 325 mg daily.  2. Plavix 75 mg daily.  3. Lopressor 25 mg b.i.d.  4. Simvastatin 80 mg q.h.s.  5. Lyrica 50 mg daily.  6. Effexor XR 150 mg b.i.d.  7. Hydrocodone/APAP 5/500 mg as previously prescribed.  8. Methadone hydrochloride 5 mg as  previously prescribed.  9. Nitroglycerin 0.4 mg sublingual p.r.n. chest pain.   OUTSTANDING LAB STUDIES:  None.  Duration of discharge encounter 35  minutes including physician time.      Nicolasa Ducking, ANP      Veverly Fells. Excell Seltzer, MD  Electronically Signed    CB/MEDQ  D:  11/10/2006  T:  11/10/2006  Job:  [id number]

## 2011-04-25 NOTE — Discharge Summary (Signed)
NAME:  Howard Nelson, Howard Nelson NO.:  [id number]   MEDICAL RECORD NO.:  [id number]          PATIENT TYPE:  INP   LOCATION:  3731                         FACILITY:  [facility]   PHYSICIAN:  Veverly Fells. Excell Seltzer, MD  DATE OF BIRTH:  Aug 11, 1961   DATE OF ADMISSION:  11/22/2006  DATE OF DISCHARGE:  11/24/2006                               DISCHARGE SUMMARY   PRIMARY CARDIOLOGIST:  Previously, Dr. Excell Seltzer, however, the patient  wishes to transfer his care to [facility] as it geographically more feasible  for him.   PRIMARY CARE PHYSICIAN:  Dr. Donzetta Sprung in [place].   PRINCIPAL DIAGNOSIS:  Chest pain.   SECONDARY DIAGNOSES:  1. History of coronary artery disease, status post percutaneous      coronary intervention and stenting of the right coronary artery      with placement of bare metal stent and this was November 09, 2006.  2. Hyperlipidemia.  3. Remote tobacco abuse.  4. Chronic pain.  5. Depression.  6. History of shoulder surgery.   ALLERGIES:  AMPICILLIN.   PROCEDURES:  1. Left heart cardiac catheterization.  2. Chest CT.   HISTORY OF PRESENT ILLNESS:  A 50 year old white male who is recently  status post PCI and stenting of the right coronary artery with placement  of a bare metal stent on August 10, 2006.  He was discharged home  November 10, 2006 and returned to work and November 16, 2006, and noted  3/10 chest discomfort occurring with exertion that was persistent until  he would rest.  Because of recurrence of symptoms, he presented to the  [facility] ED on November 22, 2006, for further evaluation.  His cardiac  markers were normal and the ECG was without any acute changes.  He was  admitted for cardiac catheterization.   HOSPITAL COURSE:  Cardiac enzymes remained negative and he underwent  left heart cardiac catheterization on November 23, 2006, revealing  widely patent right coronary artery stent and otherwise nonobstructive  disease.  It was noted on his lab work  that his D-dimer was elevated at  0.60 and as a result he underwent a chest CT this morning and this was  negative for pulmonary embolism.  He has had not any recurrent chest  discomfort this morning.  He will be discharged home this afternoon in  satisfactory condition.   DISCHARGE LABORATORY:  Hemoglobin 14.9, hematocrit 41.6, WBC 5.6,  platelets 234.  Sodium 145, potassium 4.1, chloride 109, CO2 31, BUN 17,  creatinine 1, glucose 87, calcium 9.  CK 69, MB 1, troponin I 0.01.   DISPOSITION:  The patient is being discharged home today in good  condition.   FOLLOWUP PLANS/APPOINTMENT:  1. At his request, his care is transferred to our [facility] office and he      has a followup appointment with Dr. Andee Lineman on December 10, 2005 at 1      p.m..  2. He is asked to follow up with Dr. Reuel Boom in 1-2 weeks.   DISCHARGE MEDICATIONS:  1. Aspirin 325 mg daily.  2. Plavix and  5 mg daily.  3. Metoprolol 25 mg b.i.d.  4. Chantix 1 mg b.i.d.  5. Nitroglycerin 0.4 mg sublingual p.r.n. chest pain.  6. Simvastatin 80 mg q.h.s.  7. Effexor XR 150 mg daily.  8. Trazodone 50 mg h.s. p.r.n. insomnia.  9. Vicodin as previously prescribed.   OUTSTANDING LABORATORY STUDIES:  None.   DURATION DISCHARGE ENCOUNTER:  Forty minutes including physician time.      Nicolasa Ducking, ANP      Veverly Fells. Excell Seltzer, MD  Electronically Signed    CB/MEDQ  D:  11/24/2006  T:  11/24/2006  Job:  [id number]   cc:   Donzetta Sprung

## 2011-04-25 NOTE — Assessment & Plan Note (Signed)
[facility]                          [facility] OFFICE NOTE   NAME:Howard Nelson, Howard Nelson                         MRN:          [id number]  DATE:12/10/2006                            DOB:          Aug 28, 1961    REFERRING PHYSICIAN:  Donzetta Sprung   HISTORY OF PRESENT ILLNESS:  The patient is a white male with a history  of coronary artery disease, status post PCI and stenting of the right  coronary artery, November 09, 2006.  The patient had a repeat cardiac  catheterization for ongoing chest pain in the middle of December which  showed a widely patent stent.  There was no additional significant CAD.  The patient continues to have off-and-on chest pains which are very  atypical; they are left-sided, underneath, towards the left axilla.  This is not related to exertion or any type of activity.  The patient  does work as a Designer, multimedia heavy transmissions,  which may actually cause some worsening pain.  He denies any orthopnea  or PND.  There is no palpitations or syncope.   MEDICATIONS:  1. Aciphex 20 mg a day.  2. Simvastatin 80 mg a day.  3. Plavix 75 mg a day.  4. Metoprolol 25 mg b.i.d.  5. Chantix.  6. Aspirin 325 mg daily.  7. Effexor XR.  8. Trazodone 50 mg two tabs nightly.  9. Triamcinolone cream.   PHYSICAL EXAMINATION:  VITAL SIGNS:  Blood pressure 142/88, heart rate  is 80 beats per minute and weight is 225 pounds.  NECK:  Normal carotid upstroke.  No carotid bruits.  LUNGS:  Clear breath sounds bilaterally.  HEART:  Regular rate and rhythm, normal sinus rhythm.  ABDOMEN:  Soft and nontender.  No rebound or guarding.  Good bowel  sounds.  EXTREMITIES:  No cyanosis, clubbing or edema.   PROBLEM LIST:  1. Coronary artery disease.      a.     Status post percutaneous coronary intervention to the right       coronary artery.      b.     Re-look catheterization, November 23, 2006, with patent       stent.  2.  Dyslipidemia.  3. Tobacco use.  4. Chronic chest pain, atypical.  5. Depression.  6. History of shoulder surgery.   PLAN:  1. The patient's chest pain is atypical and appears to be noncardiac.  2. The patient does complain of insomnia and we have stopped his      trazodone and he will start Ambien 5 mg p.o. nightly.  For future      drug therapy of his insomnia, the patient has been referred to Dr.      Reuel Boom.  3. The patient has been encouraged again to stop smoking altogether.  4. The patient can follow up with [place] in 6 months, at which time liver      function tests and lipid panel will be drawn.     Learta Codding, MD,FACC  Electronically Signed    GED/MedQ  DD: 12/10/2006  DT:  12/10/2006  Job #: [id number]   cc:   Donzetta Sprung

## 2011-09-04 LAB — CULTURE, ROUTINE-ABSCESS: Special Requests: 5304

## 2011-12-31 DIAGNOSIS — J309 Allergic rhinitis, unspecified: Secondary | ICD-10-CM | POA: Diagnosis not present

## 2011-12-31 DIAGNOSIS — F17200 Nicotine dependence, unspecified, uncomplicated: Secondary | ICD-10-CM | POA: Diagnosis not present

## 2011-12-31 DIAGNOSIS — I10 Essential (primary) hypertension: Secondary | ICD-10-CM | POA: Diagnosis not present

## 2011-12-31 DIAGNOSIS — E782 Mixed hyperlipidemia: Secondary | ICD-10-CM | POA: Diagnosis not present

## 2011-12-31 DIAGNOSIS — IMO0001 Reserved for inherently not codable concepts without codable children: Secondary | ICD-10-CM | POA: Diagnosis not present

## 2011-12-31 DIAGNOSIS — M1990 Unspecified osteoarthritis, unspecified site: Secondary | ICD-10-CM | POA: Diagnosis not present

## 2012-03-24 DIAGNOSIS — E782 Mixed hyperlipidemia: Secondary | ICD-10-CM | POA: Diagnosis not present

## 2012-03-24 DIAGNOSIS — E7800 Pure hypercholesterolemia, unspecified: Secondary | ICD-10-CM | POA: Diagnosis not present

## 2012-03-24 DIAGNOSIS — I10 Essential (primary) hypertension: Secondary | ICD-10-CM | POA: Diagnosis not present

## 2012-03-24 DIAGNOSIS — R5383 Other fatigue: Secondary | ICD-10-CM | POA: Diagnosis not present

## 2012-04-02 DIAGNOSIS — F17200 Nicotine dependence, unspecified, uncomplicated: Secondary | ICD-10-CM | POA: Diagnosis not present

## 2012-04-02 DIAGNOSIS — J309 Allergic rhinitis, unspecified: Secondary | ICD-10-CM | POA: Diagnosis not present

## 2012-04-02 DIAGNOSIS — I10 Essential (primary) hypertension: Secondary | ICD-10-CM | POA: Diagnosis not present

## 2012-04-02 DIAGNOSIS — E782 Mixed hyperlipidemia: Secondary | ICD-10-CM | POA: Diagnosis not present

## 2012-04-02 DIAGNOSIS — M1990 Unspecified osteoarthritis, unspecified site: Secondary | ICD-10-CM | POA: Diagnosis not present

## 2012-04-02 DIAGNOSIS — IMO0001 Reserved for inherently not codable concepts without codable children: Secondary | ICD-10-CM | POA: Diagnosis not present

## 2012-04-09 DIAGNOSIS — R945 Abnormal results of liver function studies: Secondary | ICD-10-CM | POA: Diagnosis not present

## 2012-04-10 DIAGNOSIS — R7989 Other specified abnormal findings of blood chemistry: Secondary | ICD-10-CM | POA: Diagnosis not present

## 2012-04-10 DIAGNOSIS — Z9089 Acquired absence of other organs: Secondary | ICD-10-CM | POA: Diagnosis not present

## 2012-04-29 DIAGNOSIS — I10 Essential (primary) hypertension: Secondary | ICD-10-CM | POA: Diagnosis not present

## 2012-04-29 DIAGNOSIS — E782 Mixed hyperlipidemia: Secondary | ICD-10-CM | POA: Diagnosis not present

## 2012-05-05 DIAGNOSIS — R748 Abnormal levels of other serum enzymes: Secondary | ICD-10-CM | POA: Diagnosis not present

## 2012-05-11 DIAGNOSIS — M1990 Unspecified osteoarthritis, unspecified site: Secondary | ICD-10-CM | POA: Diagnosis not present

## 2012-05-11 DIAGNOSIS — IMO0001 Reserved for inherently not codable concepts without codable children: Secondary | ICD-10-CM | POA: Diagnosis not present

## 2012-05-24 DIAGNOSIS — Z79899 Other long term (current) drug therapy: Secondary | ICD-10-CM | POA: Diagnosis not present

## 2012-07-01 ENCOUNTER — Encounter: Payer: Self-pay | Admitting: *Deleted

## 2012-08-05 DIAGNOSIS — I10 Essential (primary) hypertension: Secondary | ICD-10-CM | POA: Diagnosis not present

## 2012-08-05 DIAGNOSIS — J309 Allergic rhinitis, unspecified: Secondary | ICD-10-CM | POA: Diagnosis not present

## 2012-08-05 DIAGNOSIS — E782 Mixed hyperlipidemia: Secondary | ICD-10-CM | POA: Diagnosis not present

## 2012-08-05 DIAGNOSIS — F17200 Nicotine dependence, unspecified, uncomplicated: Secondary | ICD-10-CM | POA: Diagnosis not present

## 2012-08-05 DIAGNOSIS — M1990 Unspecified osteoarthritis, unspecified site: Secondary | ICD-10-CM | POA: Diagnosis not present

## 2012-08-05 DIAGNOSIS — IMO0001 Reserved for inherently not codable concepts without codable children: Secondary | ICD-10-CM | POA: Diagnosis not present

## 2012-11-26 DIAGNOSIS — E782 Mixed hyperlipidemia: Secondary | ICD-10-CM | POA: Diagnosis not present

## 2012-11-26 DIAGNOSIS — I10 Essential (primary) hypertension: Secondary | ICD-10-CM | POA: Diagnosis not present

## 2012-12-02 DIAGNOSIS — IMO0002 Reserved for concepts with insufficient information to code with codable children: Secondary | ICD-10-CM | POA: Diagnosis not present

## 2012-12-02 DIAGNOSIS — IMO0001 Reserved for inherently not codable concepts without codable children: Secondary | ICD-10-CM | POA: Diagnosis not present

## 2012-12-02 DIAGNOSIS — K589 Irritable bowel syndrome without diarrhea: Secondary | ICD-10-CM | POA: Diagnosis not present

## 2012-12-02 DIAGNOSIS — I10 Essential (primary) hypertension: Secondary | ICD-10-CM | POA: Diagnosis not present

## 2012-12-02 DIAGNOSIS — E782 Mixed hyperlipidemia: Secondary | ICD-10-CM | POA: Diagnosis not present

## 2012-12-02 DIAGNOSIS — F17200 Nicotine dependence, unspecified, uncomplicated: Secondary | ICD-10-CM | POA: Diagnosis not present

## 2012-12-11 DIAGNOSIS — R071 Chest pain on breathing: Secondary | ICD-10-CM | POA: Diagnosis not present

## 2012-12-11 DIAGNOSIS — M545 Low back pain: Secondary | ICD-10-CM | POA: Diagnosis not present

## 2013-02-15 DIAGNOSIS — K589 Irritable bowel syndrome without diarrhea: Secondary | ICD-10-CM | POA: Diagnosis not present

## 2013-02-25 DIAGNOSIS — M1990 Unspecified osteoarthritis, unspecified site: Secondary | ICD-10-CM | POA: Diagnosis not present

## 2013-02-25 DIAGNOSIS — E538 Deficiency of other specified B group vitamins: Secondary | ICD-10-CM | POA: Diagnosis not present

## 2013-04-19 DIAGNOSIS — E782 Mixed hyperlipidemia: Secondary | ICD-10-CM | POA: Diagnosis not present

## 2013-04-19 DIAGNOSIS — K589 Irritable bowel syndrome without diarrhea: Secondary | ICD-10-CM | POA: Diagnosis not present

## 2013-04-19 DIAGNOSIS — IMO0001 Reserved for inherently not codable concepts without codable children: Secondary | ICD-10-CM | POA: Diagnosis not present

## 2013-04-19 DIAGNOSIS — I10 Essential (primary) hypertension: Secondary | ICD-10-CM | POA: Diagnosis not present

## 2013-04-19 DIAGNOSIS — F17200 Nicotine dependence, unspecified, uncomplicated: Secondary | ICD-10-CM | POA: Diagnosis not present

## 2013-05-19 DIAGNOSIS — M722 Plantar fascial fibromatosis: Secondary | ICD-10-CM | POA: Diagnosis not present

## 2013-06-23 DIAGNOSIS — I10 Essential (primary) hypertension: Secondary | ICD-10-CM | POA: Diagnosis not present

## 2013-06-23 DIAGNOSIS — E782 Mixed hyperlipidemia: Secondary | ICD-10-CM | POA: Diagnosis not present

## 2013-06-29 DIAGNOSIS — E782 Mixed hyperlipidemia: Secondary | ICD-10-CM | POA: Diagnosis not present

## 2013-06-29 DIAGNOSIS — I10 Essential (primary) hypertension: Secondary | ICD-10-CM | POA: Diagnosis not present

## 2013-06-29 DIAGNOSIS — IMO0001 Reserved for inherently not codable concepts without codable children: Secondary | ICD-10-CM | POA: Diagnosis not present

## 2013-06-29 DIAGNOSIS — F17200 Nicotine dependence, unspecified, uncomplicated: Secondary | ICD-10-CM | POA: Diagnosis not present

## 2013-06-29 DIAGNOSIS — K589 Irritable bowel syndrome without diarrhea: Secondary | ICD-10-CM | POA: Diagnosis not present

## 2013-08-15 DIAGNOSIS — M6240 Contracture of muscle, unspecified site: Secondary | ICD-10-CM | POA: Diagnosis not present

## 2013-08-15 DIAGNOSIS — M722 Plantar fascial fibromatosis: Secondary | ICD-10-CM | POA: Diagnosis not present

## 2013-09-22 DIAGNOSIS — Z135 Encounter for screening for eye and ear disorders: Secondary | ICD-10-CM | POA: Diagnosis not present

## 2013-09-22 DIAGNOSIS — M5126 Other intervertebral disc displacement, lumbar region: Secondary | ICD-10-CM | POA: Diagnosis not present

## 2013-09-22 DIAGNOSIS — Z0189 Encounter for other specified special examinations: Secondary | ICD-10-CM | POA: Diagnosis not present

## 2013-09-22 DIAGNOSIS — M519 Unspecified thoracic, thoracolumbar and lumbosacral intervertebral disc disorder: Secondary | ICD-10-CM | POA: Diagnosis not present

## 2013-10-07 DIAGNOSIS — F17200 Nicotine dependence, unspecified, uncomplicated: Secondary | ICD-10-CM | POA: Diagnosis not present

## 2013-10-07 DIAGNOSIS — Z23 Encounter for immunization: Secondary | ICD-10-CM | POA: Diagnosis not present

## 2013-10-07 DIAGNOSIS — K589 Irritable bowel syndrome without diarrhea: Secondary | ICD-10-CM | POA: Diagnosis not present

## 2013-10-07 DIAGNOSIS — E782 Mixed hyperlipidemia: Secondary | ICD-10-CM | POA: Diagnosis not present

## 2013-10-07 DIAGNOSIS — I10 Essential (primary) hypertension: Secondary | ICD-10-CM | POA: Diagnosis not present

## 2013-10-07 DIAGNOSIS — IMO0001 Reserved for inherently not codable concepts without codable children: Secondary | ICD-10-CM | POA: Diagnosis not present

## 2013-10-20 DIAGNOSIS — Z9889 Other specified postprocedural states: Secondary | ICD-10-CM | POA: Diagnosis not present

## 2013-10-20 DIAGNOSIS — M5137 Other intervertebral disc degeneration, lumbosacral region: Secondary | ICD-10-CM | POA: Diagnosis not present

## 2013-10-20 DIAGNOSIS — M47817 Spondylosis without myelopathy or radiculopathy, lumbosacral region: Secondary | ICD-10-CM | POA: Diagnosis not present

## 2013-10-20 DIAGNOSIS — IMO0001 Reserved for inherently not codable concepts without codable children: Secondary | ICD-10-CM | POA: Diagnosis not present

## 2013-10-28 DIAGNOSIS — I739 Peripheral vascular disease, unspecified: Secondary | ICD-10-CM | POA: Diagnosis not present

## 2013-11-01 DIAGNOSIS — R079 Chest pain, unspecified: Secondary | ICD-10-CM | POA: Diagnosis not present

## 2013-11-02 DIAGNOSIS — F17200 Nicotine dependence, unspecified, uncomplicated: Secondary | ICD-10-CM | POA: Diagnosis not present

## 2013-11-02 DIAGNOSIS — I2510 Atherosclerotic heart disease of native coronary artery without angina pectoris: Secondary | ICD-10-CM | POA: Diagnosis not present

## 2013-11-02 DIAGNOSIS — Z79899 Other long term (current) drug therapy: Secondary | ICD-10-CM | POA: Diagnosis not present

## 2013-11-02 DIAGNOSIS — M129 Arthropathy, unspecified: Secondary | ICD-10-CM | POA: Diagnosis not present

## 2013-11-02 DIAGNOSIS — Z8249 Family history of ischemic heart disease and other diseases of the circulatory system: Secondary | ICD-10-CM | POA: Diagnosis not present

## 2013-11-02 DIAGNOSIS — G8929 Other chronic pain: Secondary | ICD-10-CM | POA: Diagnosis not present

## 2013-11-02 DIAGNOSIS — E876 Hypokalemia: Secondary | ICD-10-CM | POA: Diagnosis not present

## 2013-11-02 DIAGNOSIS — E785 Hyperlipidemia, unspecified: Secondary | ICD-10-CM | POA: Diagnosis not present

## 2013-11-02 DIAGNOSIS — I200 Unstable angina: Secondary | ICD-10-CM | POA: Diagnosis not present

## 2013-11-02 DIAGNOSIS — R079 Chest pain, unspecified: Secondary | ICD-10-CM | POA: Diagnosis not present

## 2013-11-02 DIAGNOSIS — I10 Essential (primary) hypertension: Secondary | ICD-10-CM | POA: Diagnosis not present

## 2013-11-02 DIAGNOSIS — Z7982 Long term (current) use of aspirin: Secondary | ICD-10-CM | POA: Diagnosis not present

## 2013-11-02 DIAGNOSIS — Z9861 Coronary angioplasty status: Secondary | ICD-10-CM | POA: Diagnosis not present

## 2013-11-02 DIAGNOSIS — M545 Low back pain, unspecified: Secondary | ICD-10-CM | POA: Diagnosis not present

## 2013-11-02 DIAGNOSIS — R7309 Other abnormal glucose: Secondary | ICD-10-CM | POA: Diagnosis not present

## 2013-11-02 DIAGNOSIS — R7989 Other specified abnormal findings of blood chemistry: Secondary | ICD-10-CM | POA: Diagnosis not present

## 2013-11-02 DIAGNOSIS — IMO0001 Reserved for inherently not codable concepts without codable children: Secondary | ICD-10-CM | POA: Diagnosis not present

## 2013-11-03 DIAGNOSIS — I2510 Atherosclerotic heart disease of native coronary artery without angina pectoris: Secondary | ICD-10-CM | POA: Diagnosis not present

## 2013-11-03 DIAGNOSIS — E785 Hyperlipidemia, unspecified: Secondary | ICD-10-CM | POA: Diagnosis not present

## 2013-11-03 DIAGNOSIS — I200 Unstable angina: Secondary | ICD-10-CM | POA: Diagnosis not present

## 2013-11-03 DIAGNOSIS — R7309 Other abnormal glucose: Secondary | ICD-10-CM | POA: Diagnosis not present

## 2013-11-03 DIAGNOSIS — I10 Essential (primary) hypertension: Secondary | ICD-10-CM | POA: Diagnosis not present

## 2013-11-06 DIAGNOSIS — S61209A Unspecified open wound of unspecified finger without damage to nail, initial encounter: Secondary | ICD-10-CM | POA: Diagnosis not present

## 2013-11-06 DIAGNOSIS — Z7982 Long term (current) use of aspirin: Secondary | ICD-10-CM | POA: Diagnosis not present

## 2013-11-06 DIAGNOSIS — F17200 Nicotine dependence, unspecified, uncomplicated: Secondary | ICD-10-CM | POA: Diagnosis not present

## 2013-11-06 DIAGNOSIS — Y92009 Unspecified place in unspecified non-institutional (private) residence as the place of occurrence of the external cause: Secondary | ICD-10-CM | POA: Diagnosis not present

## 2013-11-06 DIAGNOSIS — Z23 Encounter for immunization: Secondary | ICD-10-CM | POA: Diagnosis not present

## 2013-11-06 DIAGNOSIS — Z9861 Coronary angioplasty status: Secondary | ICD-10-CM | POA: Diagnosis not present

## 2013-11-06 DIAGNOSIS — S6980XA Other specified injuries of unspecified wrist, hand and finger(s), initial encounter: Secondary | ICD-10-CM | POA: Diagnosis not present

## 2013-11-06 DIAGNOSIS — I10 Essential (primary) hypertension: Secondary | ICD-10-CM | POA: Diagnosis not present

## 2013-11-06 DIAGNOSIS — Z79899 Other long term (current) drug therapy: Secondary | ICD-10-CM | POA: Diagnosis not present

## 2013-11-06 DIAGNOSIS — M79609 Pain in unspecified limb: Secondary | ICD-10-CM | POA: Diagnosis not present

## 2013-11-06 DIAGNOSIS — W298XXA Contact with other powered powered hand tools and household machinery, initial encounter: Secondary | ICD-10-CM | POA: Diagnosis not present

## 2013-11-06 DIAGNOSIS — I2510 Atherosclerotic heart disease of native coronary artery without angina pectoris: Secondary | ICD-10-CM | POA: Diagnosis not present

## 2013-11-07 DIAGNOSIS — E785 Hyperlipidemia, unspecified: Secondary | ICD-10-CM | POA: Diagnosis not present

## 2013-11-07 DIAGNOSIS — F411 Generalized anxiety disorder: Secondary | ICD-10-CM | POA: Diagnosis not present

## 2013-11-07 DIAGNOSIS — IMO0001 Reserved for inherently not codable concepts without codable children: Secondary | ICD-10-CM | POA: Diagnosis not present

## 2013-11-07 DIAGNOSIS — Z883 Allergy status to other anti-infective agents status: Secondary | ICD-10-CM | POA: Diagnosis not present

## 2013-11-07 DIAGNOSIS — F17200 Nicotine dependence, unspecified, uncomplicated: Secondary | ICD-10-CM | POA: Diagnosis not present

## 2013-11-07 DIAGNOSIS — J309 Allergic rhinitis, unspecified: Secondary | ICD-10-CM | POA: Diagnosis not present

## 2013-11-07 DIAGNOSIS — F329 Major depressive disorder, single episode, unspecified: Secondary | ICD-10-CM | POA: Diagnosis not present

## 2013-11-07 DIAGNOSIS — I2510 Atherosclerotic heart disease of native coronary artery without angina pectoris: Secondary | ICD-10-CM | POA: Diagnosis not present

## 2013-11-07 DIAGNOSIS — R079 Chest pain, unspecified: Secondary | ICD-10-CM | POA: Diagnosis not present

## 2013-11-07 DIAGNOSIS — M1990 Unspecified osteoarthritis, unspecified site: Secondary | ICD-10-CM | POA: Diagnosis not present

## 2013-11-07 DIAGNOSIS — Z79899 Other long term (current) drug therapy: Secondary | ICD-10-CM | POA: Diagnosis not present

## 2013-11-07 DIAGNOSIS — Z9861 Coronary angioplasty status: Secondary | ICD-10-CM | POA: Diagnosis not present

## 2013-11-07 DIAGNOSIS — R0602 Shortness of breath: Secondary | ICD-10-CM | POA: Diagnosis not present

## 2013-11-07 DIAGNOSIS — K219 Gastro-esophageal reflux disease without esophagitis: Secondary | ICD-10-CM | POA: Diagnosis not present

## 2013-11-07 DIAGNOSIS — R0789 Other chest pain: Secondary | ICD-10-CM | POA: Diagnosis not present

## 2013-11-07 DIAGNOSIS — Z7982 Long term (current) use of aspirin: Secondary | ICD-10-CM | POA: Diagnosis not present

## 2013-11-08 DIAGNOSIS — R0789 Other chest pain: Secondary | ICD-10-CM | POA: Diagnosis not present

## 2013-11-08 DIAGNOSIS — I10 Essential (primary) hypertension: Secondary | ICD-10-CM | POA: Diagnosis not present

## 2013-11-08 DIAGNOSIS — R079 Chest pain, unspecified: Secondary | ICD-10-CM | POA: Diagnosis not present

## 2013-11-08 DIAGNOSIS — R748 Abnormal levels of other serum enzymes: Secondary | ICD-10-CM | POA: Diagnosis not present

## 2013-11-08 DIAGNOSIS — I2510 Atherosclerotic heart disease of native coronary artery without angina pectoris: Secondary | ICD-10-CM | POA: Diagnosis not present

## 2013-11-09 DIAGNOSIS — I10 Essential (primary) hypertension: Secondary | ICD-10-CM | POA: Diagnosis not present

## 2013-11-09 DIAGNOSIS — R079 Chest pain, unspecified: Secondary | ICD-10-CM | POA: Diagnosis not present

## 2013-11-09 DIAGNOSIS — R748 Abnormal levels of other serum enzymes: Secondary | ICD-10-CM | POA: Diagnosis not present

## 2013-11-09 DIAGNOSIS — R0789 Other chest pain: Secondary | ICD-10-CM | POA: Diagnosis not present

## 2013-11-09 DIAGNOSIS — I2510 Atherosclerotic heart disease of native coronary artery without angina pectoris: Secondary | ICD-10-CM | POA: Diagnosis not present

## 2013-11-15 ENCOUNTER — Encounter (HOSPITAL_COMMUNITY): Payer: Medicare Other

## 2013-12-09 DIAGNOSIS — M7700 Medial epicondylitis, unspecified elbow: Secondary | ICD-10-CM | POA: Diagnosis not present

## 2013-12-09 DIAGNOSIS — IMO0001 Reserved for inherently not codable concepts without codable children: Secondary | ICD-10-CM | POA: Diagnosis not present

## 2013-12-13 ENCOUNTER — Encounter (HOSPITAL_COMMUNITY): Payer: Medicare Other

## 2013-12-19 DIAGNOSIS — I10 Essential (primary) hypertension: Secondary | ICD-10-CM | POA: Diagnosis not present

## 2013-12-19 DIAGNOSIS — I2510 Atherosclerotic heart disease of native coronary artery without angina pectoris: Secondary | ICD-10-CM | POA: Diagnosis not present

## 2013-12-19 DIAGNOSIS — R079 Chest pain, unspecified: Secondary | ICD-10-CM | POA: Diagnosis not present

## 2013-12-19 DIAGNOSIS — E785 Hyperlipidemia, unspecified: Secondary | ICD-10-CM | POA: Diagnosis not present

## 2013-12-28 DIAGNOSIS — F17200 Nicotine dependence, unspecified, uncomplicated: Secondary | ICD-10-CM | POA: Diagnosis not present

## 2013-12-28 DIAGNOSIS — IMO0001 Reserved for inherently not codable concepts without codable children: Secondary | ICD-10-CM | POA: Diagnosis not present

## 2013-12-28 DIAGNOSIS — E782 Mixed hyperlipidemia: Secondary | ICD-10-CM | POA: Diagnosis not present

## 2013-12-28 DIAGNOSIS — K2100 Gastro-esophageal reflux disease with esophagitis, without bleeding: Secondary | ICD-10-CM | POA: Diagnosis not present

## 2013-12-28 DIAGNOSIS — M1990 Unspecified osteoarthritis, unspecified site: Secondary | ICD-10-CM | POA: Diagnosis not present

## 2013-12-28 DIAGNOSIS — I10 Essential (primary) hypertension: Secondary | ICD-10-CM | POA: Diagnosis not present

## 2013-12-29 ENCOUNTER — Encounter (HOSPITAL_COMMUNITY)
Admission: RE | Admit: 2013-12-29 | Discharge: 2013-12-29 | Disposition: A | Discharge status: Home / Self Care | Payer: Medicare Other | Source: Ambulatory Visit | Attending: Family Medicine | Admitting: Family Medicine

## 2013-12-29 ENCOUNTER — Encounter (HOSPITAL_COMMUNITY): Payer: Self-pay

## 2013-12-29 DIAGNOSIS — Z9861 Coronary angioplasty status: Secondary | ICD-10-CM

## 2013-12-29 DIAGNOSIS — I214 Non-ST elevation (NSTEMI) myocardial infarction: Secondary | ICD-10-CM

## 2013-12-29 HISTORY — DX: Non-ST elevation (NSTEMI) myocardial infarction: I21.4

## 2013-12-29 NOTE — Patient Instructions (Signed)
Pt has finished orientation and is scheduled to start CR on 01/02/14 at 8:15 am. Pt has been instructed to arrive to class 15 minutes early for scheduled class. Pt has been instructed to wear comfortable clothing and shoes with rubber soles. Pt has been told to take their medications 1 hour prior to coming to class.  If the patient is not going to attend class, he/she has been instructed to call.   

## 2013-12-29 NOTE — Progress Notes (Addendum)
Patient was referred to Cardiac Rehab by Dr. Delsa SaleShannon [place] due to NSTEMI 410.70 and Coronary Stent Placement V45.82. During orientation advised patient on arrival and appointment times what to wear, what to do before, during and after exercise. Reviewed attendance and class policy. Talked about inclement weather and class consultation policy. Pt is scheduled to start Cardiac Rehab on 01/02/14 at 8:15 am. Pt was advised to come to class 5 minutes before class starts. He was also given instructions on meeting with the dietician and attending the Family Structure classes. Pt is eager to get started. Patient was not feeling well so we had to cut orientation short. Was not able to do the 6 minute walk test, measurements or finish paper work. Will finish paperwork, and measurements and do the walk test on his 1st day of exercise.

## 2014-01-02 ENCOUNTER — Encounter (HOSPITAL_COMMUNITY): Payer: Medicare Other

## 2014-01-04 ENCOUNTER — Encounter (HOSPITAL_COMMUNITY): Payer: Medicare Other

## 2014-01-06 ENCOUNTER — Encounter (HOSPITAL_COMMUNITY): Payer: Medicare Other

## 2014-01-06 DIAGNOSIS — Z23 Encounter for immunization: Secondary | ICD-10-CM | POA: Diagnosis not present

## 2014-01-06 DIAGNOSIS — E782 Mixed hyperlipidemia: Secondary | ICD-10-CM | POA: Diagnosis not present

## 2014-01-06 DIAGNOSIS — IMO0001 Reserved for inherently not codable concepts without codable children: Secondary | ICD-10-CM | POA: Diagnosis not present

## 2014-01-06 DIAGNOSIS — Z1331 Encounter for screening for depression: Secondary | ICD-10-CM | POA: Diagnosis not present

## 2014-01-06 DIAGNOSIS — Z0000 Encounter for general adult medical examination without abnormal findings: Secondary | ICD-10-CM | POA: Diagnosis not present

## 2014-01-06 DIAGNOSIS — K589 Irritable bowel syndrome without diarrhea: Secondary | ICD-10-CM | POA: Diagnosis not present

## 2014-01-06 DIAGNOSIS — I10 Essential (primary) hypertension: Secondary | ICD-10-CM | POA: Diagnosis not present

## 2014-01-06 DIAGNOSIS — F17200 Nicotine dependence, unspecified, uncomplicated: Secondary | ICD-10-CM | POA: Diagnosis not present

## 2014-01-09 ENCOUNTER — Encounter (HOSPITAL_COMMUNITY): Payer: Medicare Other

## 2014-01-11 ENCOUNTER — Encounter (HOSPITAL_COMMUNITY): Payer: Medicare Other

## 2014-01-13 ENCOUNTER — Encounter (HOSPITAL_COMMUNITY): Payer: Medicare Other

## 2014-01-16 ENCOUNTER — Encounter (HOSPITAL_COMMUNITY): Payer: Medicare Other

## 2014-01-18 ENCOUNTER — Encounter (HOSPITAL_COMMUNITY): Payer: Medicare Other

## 2014-01-20 ENCOUNTER — Encounter (HOSPITAL_COMMUNITY): Payer: Medicare Other

## 2014-01-23 ENCOUNTER — Encounter (HOSPITAL_COMMUNITY): Payer: Medicare Other

## 2014-01-25 ENCOUNTER — Encounter (HOSPITAL_COMMUNITY): Payer: Medicare Other

## 2014-01-27 ENCOUNTER — Encounter (HOSPITAL_COMMUNITY): Payer: Medicare Other

## 2014-01-30 ENCOUNTER — Encounter (HOSPITAL_COMMUNITY): Payer: Medicare Other

## 2014-02-01 ENCOUNTER — Encounter (HOSPITAL_COMMUNITY): Payer: Medicare Other

## 2014-02-03 ENCOUNTER — Encounter (HOSPITAL_COMMUNITY): Payer: Medicare Other

## 2014-02-03 DIAGNOSIS — K2100 Gastro-esophageal reflux disease with esophagitis, without bleeding: Secondary | ICD-10-CM | POA: Diagnosis not present

## 2014-02-03 DIAGNOSIS — E782 Mixed hyperlipidemia: Secondary | ICD-10-CM | POA: Diagnosis not present

## 2014-02-03 DIAGNOSIS — I10 Essential (primary) hypertension: Secondary | ICD-10-CM | POA: Diagnosis not present

## 2014-02-03 DIAGNOSIS — F17200 Nicotine dependence, unspecified, uncomplicated: Secondary | ICD-10-CM | POA: Diagnosis not present

## 2014-02-03 DIAGNOSIS — E7800 Pure hypercholesterolemia, unspecified: Secondary | ICD-10-CM | POA: Diagnosis not present

## 2014-02-03 DIAGNOSIS — E119 Type 2 diabetes mellitus without complications: Secondary | ICD-10-CM | POA: Diagnosis not present

## 2014-02-03 DIAGNOSIS — IMO0001 Reserved for inherently not codable concepts without codable children: Secondary | ICD-10-CM | POA: Diagnosis not present

## 2014-02-03 DIAGNOSIS — M1990 Unspecified osteoarthritis, unspecified site: Secondary | ICD-10-CM | POA: Diagnosis not present

## 2014-02-06 ENCOUNTER — Encounter (HOSPITAL_COMMUNITY): Payer: Medicare Other

## 2014-02-08 ENCOUNTER — Encounter (HOSPITAL_COMMUNITY): Payer: Medicare Other

## 2014-02-10 ENCOUNTER — Encounter (HOSPITAL_COMMUNITY): Payer: Medicare Other

## 2014-02-13 ENCOUNTER — Encounter (HOSPITAL_COMMUNITY): Payer: Medicare Other

## 2014-02-15 ENCOUNTER — Encounter (HOSPITAL_COMMUNITY): Payer: Medicare Other

## 2014-02-16 DIAGNOSIS — Z23 Encounter for immunization: Secondary | ICD-10-CM | POA: Diagnosis not present

## 2014-02-16 DIAGNOSIS — E8881 Metabolic syndrome: Secondary | ICD-10-CM | POA: Diagnosis not present

## 2014-02-16 DIAGNOSIS — I10 Essential (primary) hypertension: Secondary | ICD-10-CM | POA: Diagnosis not present

## 2014-02-16 DIAGNOSIS — E782 Mixed hyperlipidemia: Secondary | ICD-10-CM | POA: Diagnosis not present

## 2014-02-16 DIAGNOSIS — K589 Irritable bowel syndrome without diarrhea: Secondary | ICD-10-CM | POA: Diagnosis not present

## 2014-02-16 DIAGNOSIS — F17200 Nicotine dependence, unspecified, uncomplicated: Secondary | ICD-10-CM | POA: Diagnosis not present

## 2014-02-16 DIAGNOSIS — IMO0001 Reserved for inherently not codable concepts without codable children: Secondary | ICD-10-CM | POA: Diagnosis not present

## 2014-02-17 ENCOUNTER — Encounter (HOSPITAL_COMMUNITY): Payer: Medicare Other

## 2014-02-20 ENCOUNTER — Encounter (HOSPITAL_COMMUNITY): Payer: Medicare Other

## 2014-02-22 ENCOUNTER — Encounter (HOSPITAL_COMMUNITY): Payer: Medicare Other

## 2014-02-24 ENCOUNTER — Encounter (HOSPITAL_COMMUNITY): Payer: Medicare Other

## 2014-02-27 ENCOUNTER — Encounter (HOSPITAL_COMMUNITY): Payer: Medicare Other

## 2014-03-01 ENCOUNTER — Encounter (HOSPITAL_COMMUNITY): Payer: Medicare Other

## 2014-03-03 ENCOUNTER — Encounter (HOSPITAL_COMMUNITY): Payer: Medicare Other

## 2014-03-06 ENCOUNTER — Encounter (HOSPITAL_COMMUNITY): Payer: Medicare Other

## 2014-03-08 ENCOUNTER — Encounter (HOSPITAL_COMMUNITY): Payer: Medicare Other

## 2014-03-10 ENCOUNTER — Encounter (HOSPITAL_COMMUNITY): Payer: Medicare Other

## 2014-03-13 ENCOUNTER — Encounter (HOSPITAL_COMMUNITY): Payer: Medicare Other

## 2014-03-15 ENCOUNTER — Encounter (HOSPITAL_COMMUNITY): Payer: Medicare Other

## 2014-03-17 ENCOUNTER — Encounter (HOSPITAL_COMMUNITY): Payer: Medicare Other

## 2014-03-20 ENCOUNTER — Encounter (HOSPITAL_COMMUNITY): Payer: Medicare Other

## 2014-03-22 ENCOUNTER — Encounter (HOSPITAL_COMMUNITY): Payer: Medicare Other

## 2014-03-24 ENCOUNTER — Encounter (HOSPITAL_COMMUNITY): Payer: Medicare Other

## 2014-05-16 DIAGNOSIS — S9030XA Contusion of unspecified foot, initial encounter: Secondary | ICD-10-CM | POA: Diagnosis not present

## 2014-05-16 DIAGNOSIS — S93609A Unspecified sprain of unspecified foot, initial encounter: Secondary | ICD-10-CM | POA: Diagnosis not present

## 2014-06-12 DIAGNOSIS — IMO0001 Reserved for inherently not codable concepts without codable children: Secondary | ICD-10-CM | POA: Diagnosis not present

## 2014-06-12 DIAGNOSIS — M1990 Unspecified osteoarthritis, unspecified site: Secondary | ICD-10-CM | POA: Diagnosis not present

## 2014-06-12 DIAGNOSIS — F17200 Nicotine dependence, unspecified, uncomplicated: Secondary | ICD-10-CM | POA: Diagnosis not present

## 2014-06-12 DIAGNOSIS — E782 Mixed hyperlipidemia: Secondary | ICD-10-CM | POA: Diagnosis not present

## 2014-06-12 DIAGNOSIS — K2100 Gastro-esophageal reflux disease with esophagitis, without bleeding: Secondary | ICD-10-CM | POA: Diagnosis not present

## 2014-06-12 DIAGNOSIS — E119 Type 2 diabetes mellitus without complications: Secondary | ICD-10-CM | POA: Diagnosis not present

## 2014-06-12 DIAGNOSIS — I10 Essential (primary) hypertension: Secondary | ICD-10-CM | POA: Diagnosis not present

## 2014-06-19 DIAGNOSIS — M7710 Lateral epicondylitis, unspecified elbow: Secondary | ICD-10-CM | POA: Diagnosis not present

## 2014-06-19 DIAGNOSIS — K589 Irritable bowel syndrome without diarrhea: Secondary | ICD-10-CM | POA: Diagnosis not present

## 2014-06-19 DIAGNOSIS — IMO0001 Reserved for inherently not codable concepts without codable children: Secondary | ICD-10-CM | POA: Diagnosis not present

## 2014-06-19 DIAGNOSIS — Z5181 Encounter for therapeutic drug level monitoring: Secondary | ICD-10-CM | POA: Diagnosis not present

## 2014-06-19 DIAGNOSIS — E782 Mixed hyperlipidemia: Secondary | ICD-10-CM | POA: Diagnosis not present

## 2014-06-19 DIAGNOSIS — E8881 Metabolic syndrome: Secondary | ICD-10-CM | POA: Diagnosis not present

## 2014-06-19 DIAGNOSIS — Z23 Encounter for immunization: Secondary | ICD-10-CM | POA: Diagnosis not present

## 2014-06-19 DIAGNOSIS — I10 Essential (primary) hypertension: Secondary | ICD-10-CM | POA: Diagnosis not present

## 2014-06-19 DIAGNOSIS — F17200 Nicotine dependence, unspecified, uncomplicated: Secondary | ICD-10-CM | POA: Diagnosis not present

## 2014-06-19 DIAGNOSIS — Z79899 Other long term (current) drug therapy: Secondary | ICD-10-CM | POA: Diagnosis not present

## 2014-08-23 DIAGNOSIS — IMO0001 Reserved for inherently not codable concepts without codable children: Secondary | ICD-10-CM | POA: Diagnosis not present

## 2014-08-23 DIAGNOSIS — Z23 Encounter for immunization: Secondary | ICD-10-CM | POA: Diagnosis not present

## 2014-08-23 DIAGNOSIS — K589 Irritable bowel syndrome without diarrhea: Secondary | ICD-10-CM | POA: Diagnosis not present

## 2014-08-23 DIAGNOSIS — F17200 Nicotine dependence, unspecified, uncomplicated: Secondary | ICD-10-CM | POA: Diagnosis not present

## 2014-08-23 DIAGNOSIS — M7710 Lateral epicondylitis, unspecified elbow: Secondary | ICD-10-CM | POA: Diagnosis not present

## 2014-08-23 DIAGNOSIS — E8881 Metabolic syndrome: Secondary | ICD-10-CM | POA: Diagnosis not present

## 2014-08-23 DIAGNOSIS — E782 Mixed hyperlipidemia: Secondary | ICD-10-CM | POA: Diagnosis not present

## 2014-08-23 DIAGNOSIS — I10 Essential (primary) hypertension: Secondary | ICD-10-CM | POA: Diagnosis not present

## 2014-12-20 DIAGNOSIS — I2510 Atherosclerotic heart disease of native coronary artery without angina pectoris: Secondary | ICD-10-CM | POA: Diagnosis not present

## 2014-12-20 DIAGNOSIS — F17210 Nicotine dependence, cigarettes, uncomplicated: Secondary | ICD-10-CM | POA: Diagnosis not present

## 2014-12-20 DIAGNOSIS — I10 Essential (primary) hypertension: Secondary | ICD-10-CM | POA: Diagnosis not present

## 2014-12-20 DIAGNOSIS — E782 Mixed hyperlipidemia: Secondary | ICD-10-CM | POA: Diagnosis not present

## 2014-12-20 DIAGNOSIS — M797 Fibromyalgia: Secondary | ICD-10-CM | POA: Diagnosis not present

## 2014-12-20 DIAGNOSIS — K580 Irritable bowel syndrome with diarrhea: Secondary | ICD-10-CM | POA: Diagnosis not present

## 2014-12-20 DIAGNOSIS — M545 Low back pain: Secondary | ICD-10-CM | POA: Diagnosis not present

## 2014-12-20 DIAGNOSIS — E8881 Metabolic syndrome: Secondary | ICD-10-CM | POA: Diagnosis not present

## 2014-12-20 DIAGNOSIS — L209 Atopic dermatitis, unspecified: Secondary | ICD-10-CM | POA: Diagnosis not present

## 2014-12-20 DIAGNOSIS — K210 Gastro-esophageal reflux disease with esophagitis: Secondary | ICD-10-CM | POA: Diagnosis not present

## 2015-01-02 DIAGNOSIS — I10 Essential (primary) hypertension: Secondary | ICD-10-CM | POA: Diagnosis not present

## 2015-01-02 DIAGNOSIS — E782 Mixed hyperlipidemia: Secondary | ICD-10-CM | POA: Diagnosis not present

## 2015-01-02 DIAGNOSIS — I2510 Atherosclerotic heart disease of native coronary artery without angina pectoris: Secondary | ICD-10-CM | POA: Diagnosis not present

## 2015-01-02 DIAGNOSIS — M1990 Unspecified osteoarthritis, unspecified site: Secondary | ICD-10-CM | POA: Diagnosis not present

## 2015-01-02 DIAGNOSIS — K210 Gastro-esophageal reflux disease with esophagitis: Secondary | ICD-10-CM | POA: Diagnosis not present

## 2015-01-02 DIAGNOSIS — F17210 Nicotine dependence, cigarettes, uncomplicated: Secondary | ICD-10-CM | POA: Diagnosis not present

## 2015-01-02 DIAGNOSIS — M797 Fibromyalgia: Secondary | ICD-10-CM | POA: Diagnosis not present

## 2015-01-02 DIAGNOSIS — E119 Type 2 diabetes mellitus without complications: Secondary | ICD-10-CM | POA: Diagnosis not present

## 2015-01-09 DIAGNOSIS — Z1389 Encounter for screening for other disorder: Secondary | ICD-10-CM | POA: Diagnosis not present

## 2015-01-09 DIAGNOSIS — F17210 Nicotine dependence, cigarettes, uncomplicated: Secondary | ICD-10-CM | POA: Diagnosis not present

## 2015-01-09 DIAGNOSIS — E8881 Metabolic syndrome: Secondary | ICD-10-CM | POA: Diagnosis not present

## 2015-01-09 DIAGNOSIS — I2510 Atherosclerotic heart disease of native coronary artery without angina pectoris: Secondary | ICD-10-CM | POA: Diagnosis not present

## 2015-01-09 DIAGNOSIS — K210 Gastro-esophageal reflux disease with esophagitis: Secondary | ICD-10-CM | POA: Diagnosis not present

## 2015-01-09 DIAGNOSIS — E119 Type 2 diabetes mellitus without complications: Secondary | ICD-10-CM | POA: Diagnosis not present

## 2015-01-09 DIAGNOSIS — E782 Mixed hyperlipidemia: Secondary | ICD-10-CM | POA: Diagnosis not present

## 2015-01-09 DIAGNOSIS — K580 Irritable bowel syndrome with diarrhea: Secondary | ICD-10-CM | POA: Diagnosis not present

## 2015-01-09 DIAGNOSIS — Z0001 Encounter for general adult medical examination with abnormal findings: Secondary | ICD-10-CM | POA: Diagnosis not present

## 2015-01-09 DIAGNOSIS — Z9189 Other specified personal risk factors, not elsewhere classified: Secondary | ICD-10-CM | POA: Diagnosis not present

## 2015-01-09 DIAGNOSIS — L209 Atopic dermatitis, unspecified: Secondary | ICD-10-CM | POA: Diagnosis not present

## 2015-01-09 DIAGNOSIS — I10 Essential (primary) hypertension: Secondary | ICD-10-CM | POA: Diagnosis not present

## 2015-01-12 ENCOUNTER — Encounter: Payer: Self-pay | Admitting: *Deleted

## 2015-01-12 ENCOUNTER — Encounter: Payer: Self-pay | Admitting: Cardiology

## 2015-01-12 ENCOUNTER — Ambulatory Visit (INDEPENDENT_AMBULATORY_CARE_PROVIDER_SITE_OTHER): Payer: Medicare Other | Admitting: Cardiology

## 2015-01-12 VITALS — BP 115/76 | HR 89 | Ht 70.0 in | Wt 202.8 lb

## 2015-01-12 DIAGNOSIS — I25118 Atherosclerotic heart disease of native coronary artery with other forms of angina pectoris: Secondary | ICD-10-CM

## 2015-01-12 DIAGNOSIS — I10 Essential (primary) hypertension: Secondary | ICD-10-CM

## 2015-01-12 DIAGNOSIS — E785 Hyperlipidemia, unspecified: Secondary | ICD-10-CM | POA: Diagnosis not present

## 2015-01-12 MED ORDER — EZETIMIBE 10 MG PO TABS: 10.0000 mg | ORAL_TABLET | Freq: Every day | ORAL | Status: DC

## 2015-01-12 NOTE — Patient Instructions (Signed)
Your physician wants you to follow-up in: 1 year with Dr. Lurena JoinerBranch You will receive a reminder letter in the mail two months in advance. If you don't receive a letter, please call our office to schedule the follow-up appointment.  Your physician has recommended you make the following change in your medication:   START ZETIA 10 MG DAILY  CONTINUE ALL OTHER MEDICATIONS AS DIRECTED   WE WILL REQUEST RECORDS FROM DR. DANIELS   Thank you for choosing [facility]!!

## 2015-01-12 NOTE — Progress Notes (Signed)
Clinical Summary Mr. Neil CrouchWinn is a 54 y.o.male seen today as a new patient, he was seen several years ago in our practice by Dr Andee LinemaneGent.  1. CAD - hx of RCA stent in 2007. Repeat cath 04/2008 with patent coronaries, LVEF 50%.  - [facility]  - no chest pressure. No SOB or DOE.  - compliant with meds. Per his report elevated LFTs on statins, has not been on   2. HTN - compliant with meds  3. Hyperlipidemia Jan 2016 TC 226 TG 238 HDL 36 LDL 142 - not on statin, reports history of elevated LFTs on therapy   Past Medical History  Diagnosis Date  . Depression   . HTN (hypertension)   . Dyslipidemia   . Fibromyalgia   . Coronary atherosclerosis of native coronary artery 2009    stent  . Chest pain   . NSTEMI (non-ST elevated myocardial infarction)      Allergies  Allergen Reactions  . Ampicillin     REACTION: Headache, diarrhea     Current Outpatient Prescriptions  Medication Sig Dispense Refill  . aspirin 81 MG tablet Take 81 mg by mouth daily.    . Aspirin-Salicylamide-Caffeine (BC HEADACHE POWDER PO) Take by mouth as needed.    . diclofenac sodium (VOLTAREN) 1 % GEL Apply 1 application topically as needed.    Marland Kitchen. HYDROcodone-acetaminophen (LORCET) 10-650 MG per tablet Take 1 tablet by mouth as needed.    Marland Kitchen. ibuprofen (ADVIL,MOTRIN) 200 MG tablet Take 200 mg by mouth as needed.    . metoprolol succinate (TOPROL-XL) 25 MG 24 hr tablet Take 25 mg by mouth daily.    . nitroGLYCERIN (NITROSTAT) 0.4 MG SL tablet Place 0.4 mg under the tongue every 5 (five) minutes as needed.    Marland Kitchen. omeprazole (PRILOSEC) 20 MG capsule Take 20 mg by mouth daily.    . rosuvastatin (CRESTOR) 10 MG tablet Take 10 mg by mouth daily.     No current facility-administered medications for this visit.     Past Surgical History  Procedure Laterality Date  . Coronary artery bypass graft      cath bare metal stenting of mid right coronary artery  . Coronary stent placement        Allergies  Allergen Reactions  . Ampicillin     REACTION: Headache, diarrhea      Family History  Problem Relation Age of Onset  . Coronary artery disease Mother   . Coronary artery disease Father      Social History Mr. Neil CrouchWinn reports that he has been smoking.  He does not have any smokeless tobacco history on file. Mr. Neil CrouchWinn has no alcohol history on file.   Review of Systems CONSTITUTIONAL: No weight loss, fever, chills, weakness or fatigue.  HEENT: Eyes: No visual loss, blurred vision, double vision or yellow sclerae.No hearing loss, sneezing, congestion, runny nose or sore throat.  SKIN: No rash or itching.  CARDIOVASCULAR: per HPI RESPIRATORY: No shortness of breath, cough or sputum.  GASTROINTESTINAL: No anorexia, nausea, vomiting or diarrhea. No abdominal pain or blood.  GENITOURINARY: No burning on urination, no polyuria NEUROLOGICAL: No headache, dizziness, syncope, paralysis, ataxia, numbness or tingling in the extremities. No change in bowel or bladder control.  MUSCULOSKELETAL: No muscle, back pain, joint pain or stiffness.  LYMPHATICS: No enlarged nodes. No history of splenectomy.  PSYCHIATRIC: No history of depression or anxiety.  ENDOCRINOLOGIC: No reports of sweating, cold or heat intolerance. No  polyuria or polydipsia.  Marland Kitchen   Physical Examination p 89 bp 115/76 Wt 202 lbs BMI 29 Gen: resting comfortably, no acute distress HEENT: no scleral icterus, pupils equal round and reactive, no palptable cervical adenopathy,  CV: RRR, no m/r/g, no JVD, no carotid bruits Resp: Clear to auscultation bilaterally GI: abdomen is soft, non-tender, non-distended, normal bowel sounds, no hepatosplenomegaly MSK: extremities are warm, no edema.  Skin: warm, no rash Neuro:  no focal deficits Psych: appropriate affect   Diagnostic Studies 11/2006 Cath FINDINGS: Aortic pressure 112/71 with mean of 89. Left ventricular pressures 107/4 with an end-diastolic  pressure of 16.  CORONARY ANGIOGRAPHY: The left main stem is angiographically normal and bifurcates into the LAD and left circumflex.  The LAD is a large-caliber vessel that courses down the left ventricular apex. It has no significant angiographic disease throughout it's course. There are two diagonal branches, the first of which is a large caliber. There is a 50% ostial stenosis of the first diagonal. The second diagonal is medium caliber and is angiographically normal.  The left circumflex system is medium caliber. It gives off two marginal branches. The first marginal is medium caliber and has a 50% ostial stenosis. There is normal flow throughout that vessel. Second obtuse marginal is angiographically normal. The true AV groove circumflex is small caliber.  The right coronary artery is a large dominant vessel. It is tortuous in its proximal portion. It gives off a large PDA Kalayla Shadden distally. It gives off a posterior AV segment that has four posterolateral branches. The mid right coronary artery has a severe focal stenosis of the least 95%. There is an RV marginal Tifini Reeder that arises from this region of high-grade stenosis. There is TIMI III flow throughout the vessel.  Left ventriculogram demonstrates low normal left ventricular function with an LVEF of 55%.  ASSESSMENT: 1. Severe single-vessel coronary artery disease with high-grade focal  stenosis of the mid-right coronary artery. 2. Normal left ventricular function.  PLAN: As described above, PCI of the mid-right coronary artery was performed with a Liberte bare metal stent. This was post-dilated with a 4-0 noncompliant balloon, with an excellent angiographic result. The RV marginal Adilynn Bessey did have compromised flow following the procedure. The RV marginal Errica Dutil is approximately a 2.0-mm vessel, and I elected not to mechanically intervene on this vessel. Will place  Mr.Russaw on a nitroglycerin drip and will treat him medically. I do not think he will have long-term sequelae from his compromised RV marginal Canyon Lohr, and his right coronary artery is a very large vessel that I did not want to risk with treating the side Kathelene Rumberger. He has been pretreated with clopidogrel. He should receive dual anti-platelet therapy with aspirin and clopidogrel. He needs indefinite aspirin and clopidogrel for a minimum of 30 days. He would benefit from 1 year of clopidogrel, based on the cure data for non-stemi.  11/2006 Cath HEMODYNAMICS: AO 102/86.  CORONARIES: The left main was normal. The LAD had ostial 25% stenosis. There was a mid 25% stenosis. There was a moderate-size first diagonal which had ostial 25-30% stenosis. Second diagonal was small and normal. The circumflex essentially had a very large branching OM1. There was some proximal stenosis of 25%. The right coronary artery is a large dominant vessel. There were diffuse proximal luminal irregularities. There was a stented area across an acute marginal. This was widely patent. The acute marginal was also patent. There was a large PDA which was normal.  LEFT VENTRICLE: I did not cross  the AO and did not do an LV injection.  CONCLUSION: Widely patent right coronary artery stent.  PLAN: The etiology for the patient's chest pain is not clear. He will continue with secondary risk reduction and further evaluation by his primary care doctor.    Assessment and Plan   1. CAD - no current symptoms, continue risk factor modification and secondary prevention  2. HTN - at goal, continue current meds  3. HL - has not been on statin due to LFT elevation in the past - start zetia  daily.   F/u 1 year   Antoine Poche, M.D.

## 2015-03-16 DIAGNOSIS — E119 Type 2 diabetes mellitus without complications: Secondary | ICD-10-CM | POA: Diagnosis not present

## 2015-03-16 DIAGNOSIS — E8881 Metabolic syndrome: Secondary | ICD-10-CM | POA: Diagnosis not present

## 2015-03-16 DIAGNOSIS — K580 Irritable bowel syndrome with diarrhea: Secondary | ICD-10-CM | POA: Diagnosis not present

## 2015-03-16 DIAGNOSIS — F17210 Nicotine dependence, cigarettes, uncomplicated: Secondary | ICD-10-CM | POA: Diagnosis not present

## 2015-03-16 DIAGNOSIS — J301 Allergic rhinitis due to pollen: Secondary | ICD-10-CM | POA: Diagnosis not present

## 2015-03-16 DIAGNOSIS — I2510 Atherosclerotic heart disease of native coronary artery without angina pectoris: Secondary | ICD-10-CM | POA: Diagnosis not present

## 2015-03-16 DIAGNOSIS — M545 Low back pain: Secondary | ICD-10-CM | POA: Diagnosis not present

## 2015-03-16 DIAGNOSIS — E782 Mixed hyperlipidemia: Secondary | ICD-10-CM | POA: Diagnosis not present

## 2015-03-16 DIAGNOSIS — K219 Gastro-esophageal reflux disease without esophagitis: Secondary | ICD-10-CM | POA: Diagnosis not present

## 2015-03-16 DIAGNOSIS — M797 Fibromyalgia: Secondary | ICD-10-CM | POA: Diagnosis not present

## 2015-03-16 DIAGNOSIS — I10 Essential (primary) hypertension: Secondary | ICD-10-CM | POA: Diagnosis not present

## 2015-04-23 DIAGNOSIS — L03221 Cellulitis of neck: Secondary | ICD-10-CM | POA: Diagnosis not present

## 2015-04-23 DIAGNOSIS — M7712 Lateral epicondylitis, left elbow: Secondary | ICD-10-CM | POA: Diagnosis not present

## 2015-05-18 DIAGNOSIS — L03115 Cellulitis of right lower limb: Secondary | ICD-10-CM | POA: Diagnosis not present

## 2015-06-29 DIAGNOSIS — E782 Mixed hyperlipidemia: Secondary | ICD-10-CM | POA: Diagnosis not present

## 2015-06-29 DIAGNOSIS — I10 Essential (primary) hypertension: Secondary | ICD-10-CM | POA: Diagnosis not present

## 2015-06-29 DIAGNOSIS — K210 Gastro-esophageal reflux disease with esophagitis: Secondary | ICD-10-CM | POA: Diagnosis not present

## 2015-06-29 DIAGNOSIS — E8881 Metabolic syndrome: Secondary | ICD-10-CM | POA: Diagnosis not present

## 2015-06-29 DIAGNOSIS — E119 Type 2 diabetes mellitus without complications: Secondary | ICD-10-CM | POA: Diagnosis not present

## 2015-06-29 DIAGNOSIS — F17210 Nicotine dependence, cigarettes, uncomplicated: Secondary | ICD-10-CM | POA: Diagnosis not present

## 2015-07-03 DIAGNOSIS — F17210 Nicotine dependence, cigarettes, uncomplicated: Secondary | ICD-10-CM | POA: Diagnosis not present

## 2015-07-03 DIAGNOSIS — M797 Fibromyalgia: Secondary | ICD-10-CM | POA: Diagnosis not present

## 2015-07-03 DIAGNOSIS — J301 Allergic rhinitis due to pollen: Secondary | ICD-10-CM | POA: Diagnosis not present

## 2015-07-03 DIAGNOSIS — E8881 Metabolic syndrome: Secondary | ICD-10-CM | POA: Diagnosis not present

## 2015-07-03 DIAGNOSIS — I10 Essential (primary) hypertension: Secondary | ICD-10-CM | POA: Diagnosis not present

## 2015-07-03 DIAGNOSIS — M545 Low back pain: Secondary | ICD-10-CM | POA: Diagnosis not present

## 2015-07-03 DIAGNOSIS — K580 Irritable bowel syndrome with diarrhea: Secondary | ICD-10-CM | POA: Diagnosis not present

## 2015-07-03 DIAGNOSIS — E119 Type 2 diabetes mellitus without complications: Secondary | ICD-10-CM | POA: Diagnosis not present

## 2015-07-03 DIAGNOSIS — K219 Gastro-esophageal reflux disease without esophagitis: Secondary | ICD-10-CM | POA: Diagnosis not present

## 2015-07-03 DIAGNOSIS — I2510 Atherosclerotic heart disease of native coronary artery without angina pectoris: Secondary | ICD-10-CM | POA: Diagnosis not present

## 2015-07-03 DIAGNOSIS — E782 Mixed hyperlipidemia: Secondary | ICD-10-CM | POA: Diagnosis not present

## 2015-09-01 DIAGNOSIS — F419 Anxiety disorder, unspecified: Secondary | ICD-10-CM | POA: Diagnosis not present

## 2015-09-01 DIAGNOSIS — F329 Major depressive disorder, single episode, unspecified: Secondary | ICD-10-CM | POA: Diagnosis not present

## 2015-09-01 DIAGNOSIS — R220 Localized swelling, mass and lump, head: Secondary | ICD-10-CM | POA: Diagnosis not present

## 2015-09-01 DIAGNOSIS — Z79899 Other long term (current) drug therapy: Secondary | ICD-10-CM | POA: Diagnosis not present

## 2015-09-01 DIAGNOSIS — Z955 Presence of coronary angioplasty implant and graft: Secondary | ICD-10-CM | POA: Diagnosis not present

## 2015-09-01 DIAGNOSIS — I252 Old myocardial infarction: Secondary | ICD-10-CM | POA: Diagnosis not present

## 2015-09-01 DIAGNOSIS — I10 Essential (primary) hypertension: Secondary | ICD-10-CM | POA: Diagnosis not present

## 2015-09-01 DIAGNOSIS — M797 Fibromyalgia: Secondary | ICD-10-CM | POA: Diagnosis not present

## 2015-09-01 DIAGNOSIS — F17200 Nicotine dependence, unspecified, uncomplicated: Secondary | ICD-10-CM | POA: Diagnosis not present

## 2015-09-01 DIAGNOSIS — Z7982 Long term (current) use of aspirin: Secondary | ICD-10-CM | POA: Diagnosis not present

## 2015-09-01 DIAGNOSIS — K047 Periapical abscess without sinus: Secondary | ICD-10-CM | POA: Diagnosis not present

## 2015-09-01 DIAGNOSIS — I2510 Atherosclerotic heart disease of native coronary artery without angina pectoris: Secondary | ICD-10-CM | POA: Diagnosis not present

## 2015-09-10 DIAGNOSIS — M1611 Unilateral primary osteoarthritis, right hip: Secondary | ICD-10-CM | POA: Diagnosis not present

## 2015-09-10 DIAGNOSIS — M160 Bilateral primary osteoarthritis of hip: Secondary | ICD-10-CM | POA: Diagnosis not present

## 2015-09-10 DIAGNOSIS — M1612 Unilateral primary osteoarthritis, left hip: Secondary | ICD-10-CM | POA: Diagnosis not present

## 2015-09-27 DIAGNOSIS — K210 Gastro-esophageal reflux disease with esophagitis: Secondary | ICD-10-CM | POA: Diagnosis not present

## 2015-09-27 DIAGNOSIS — E8881 Metabolic syndrome: Secondary | ICD-10-CM | POA: Diagnosis not present

## 2015-09-27 DIAGNOSIS — I10 Essential (primary) hypertension: Secondary | ICD-10-CM | POA: Diagnosis not present

## 2015-09-27 DIAGNOSIS — E782 Mixed hyperlipidemia: Secondary | ICD-10-CM | POA: Diagnosis not present

## 2015-09-27 DIAGNOSIS — E119 Type 2 diabetes mellitus without complications: Secondary | ICD-10-CM | POA: Diagnosis not present

## 2015-10-04 DIAGNOSIS — Z23 Encounter for immunization: Secondary | ICD-10-CM | POA: Diagnosis not present

## 2015-10-04 DIAGNOSIS — I25111 Atherosclerotic heart disease of native coronary artery with angina pectoris with documented spasm: Secondary | ICD-10-CM | POA: Diagnosis not present

## 2015-10-04 DIAGNOSIS — E8881 Metabolic syndrome: Secondary | ICD-10-CM | POA: Diagnosis not present

## 2015-10-04 DIAGNOSIS — K580 Irritable bowel syndrome with diarrhea: Secondary | ICD-10-CM | POA: Diagnosis not present

## 2015-10-04 DIAGNOSIS — J301 Allergic rhinitis due to pollen: Secondary | ICD-10-CM | POA: Diagnosis not present

## 2015-10-04 DIAGNOSIS — M797 Fibromyalgia: Secondary | ICD-10-CM | POA: Diagnosis not present

## 2015-10-04 DIAGNOSIS — M545 Low back pain: Secondary | ICD-10-CM | POA: Diagnosis not present

## 2015-10-04 DIAGNOSIS — I10 Essential (primary) hypertension: Secondary | ICD-10-CM | POA: Diagnosis not present

## 2015-10-04 DIAGNOSIS — E782 Mixed hyperlipidemia: Secondary | ICD-10-CM | POA: Diagnosis not present

## 2015-10-04 DIAGNOSIS — E119 Type 2 diabetes mellitus without complications: Secondary | ICD-10-CM | POA: Diagnosis not present

## 2015-10-04 DIAGNOSIS — K219 Gastro-esophageal reflux disease without esophagitis: Secondary | ICD-10-CM | POA: Diagnosis not present

## 2015-10-04 DIAGNOSIS — F17210 Nicotine dependence, cigarettes, uncomplicated: Secondary | ICD-10-CM | POA: Diagnosis not present

## 2015-10-12 DIAGNOSIS — Z77018 Contact with and (suspected) exposure to other hazardous metals: Secondary | ICD-10-CM | POA: Diagnosis not present

## 2015-10-12 DIAGNOSIS — R7989 Other specified abnormal findings of blood chemistry: Secondary | ICD-10-CM | POA: Diagnosis not present

## 2015-10-12 DIAGNOSIS — R1030 Lower abdominal pain, unspecified: Secondary | ICD-10-CM | POA: Diagnosis not present

## 2015-10-12 DIAGNOSIS — M94251 Chondromalacia, right hip: Secondary | ICD-10-CM | POA: Diagnosis not present

## 2015-10-12 DIAGNOSIS — S76312A Strain of muscle, fascia and tendon of the posterior muscle group at thigh level, left thigh, initial encounter: Secondary | ICD-10-CM | POA: Diagnosis not present

## 2015-10-12 DIAGNOSIS — M94252 Chondromalacia, left hip: Secondary | ICD-10-CM | POA: Diagnosis not present

## 2015-10-12 DIAGNOSIS — R109 Unspecified abdominal pain: Secondary | ICD-10-CM | POA: Diagnosis not present

## 2015-10-12 DIAGNOSIS — R609 Edema, unspecified: Secondary | ICD-10-CM | POA: Diagnosis not present

## 2015-10-12 DIAGNOSIS — S73191A Other sprain of right hip, initial encounter: Secondary | ICD-10-CM | POA: Diagnosis not present

## 2015-10-12 DIAGNOSIS — Z01818 Encounter for other preprocedural examination: Secondary | ICD-10-CM | POA: Diagnosis not present

## 2015-10-12 DIAGNOSIS — Z9049 Acquired absence of other specified parts of digestive tract: Secondary | ICD-10-CM | POA: Diagnosis not present

## 2015-10-16 DIAGNOSIS — L03012 Cellulitis of left finger: Secondary | ICD-10-CM | POA: Diagnosis not present

## 2015-10-19 DIAGNOSIS — E119 Type 2 diabetes mellitus without complications: Secondary | ICD-10-CM | POA: Diagnosis not present

## 2015-10-19 DIAGNOSIS — I10 Essential (primary) hypertension: Secondary | ICD-10-CM | POA: Diagnosis not present

## 2015-10-25 ENCOUNTER — Ambulatory Visit (INDEPENDENT_AMBULATORY_CARE_PROVIDER_SITE_OTHER): Payer: Medicare Other | Admitting: Cardiology

## 2015-10-25 ENCOUNTER — Encounter: Payer: Self-pay | Admitting: Cardiology

## 2015-10-25 VITALS — BP 137/83 | HR 64 | Ht 70.0 in | Wt 199.0 lb

## 2015-10-25 DIAGNOSIS — I10 Essential (primary) hypertension: Secondary | ICD-10-CM

## 2015-10-25 DIAGNOSIS — E785 Hyperlipidemia, unspecified: Secondary | ICD-10-CM | POA: Diagnosis not present

## 2015-10-25 DIAGNOSIS — I2510 Atherosclerotic heart disease of native coronary artery without angina pectoris: Secondary | ICD-10-CM

## 2015-10-25 NOTE — Patient Instructions (Signed)
Your physician wants you to follow-up in: 1 YEAR WITH DR. BRANCH You will receive a reminder letter in the mail two months in advance. If you don't receive a letter, please call our office to schedule the follow-up appointment.  Your physician has recommended you make the following change in your medication:   STOP EFFIENT   STOP ZETIA  Thank you for choosing [facility]!!

## 2015-10-25 NOTE — Progress Notes (Signed)
Patient ID: Howard ShutterBryan L Paino Sr., male   DOB: 1961/07/31, 54 y.o.   MRN: [id number]     Clinical Summary Howard Nelson is a 54 y.o.male seen today for follow up of the following medical problems.   1. CAD - hx of RCA stent in 2007. Repeat cath 04/2008 with patent coronaries, LVEF 50%.  - [facility]  - no chest pain, no SOB, no DOE.  - compliant with meds.    2. HTN - does not check regularly - compliant with meds  3. Hyperlipidemia Jan 2016 TC 226 TG 238 HDL 36 LDL 142 - not on statin, reports history of elevated LFTs on therapy. Has been on zetia. LFTs remain elevated  Past Medical History  Diagnosis Date  . Depression   . HTN (hypertension)   . Dyslipidemia   . Fibromyalgia   . Coronary atherosclerosis of native coronary artery 2009    stent  . Chest pain   . NSTEMI (non-ST elevated myocardial infarction)      Allergies  Allergen Reactions  . Ampicillin     REACTION: Headache, diarrhea     Current Outpatient Prescriptions  Medication Sig Dispense Refill  . acetaminophen (TYLENOL) 500 MG tablet Take 500 mg by mouth as needed.    Marland Kitchen. aspirin 81 MG tablet Take 81 mg by mouth daily.    . Clindamycin Phosphate foam Apply topically as needed.    . diclofenac sodium (VOLTAREN) 1 % GEL Apply 1 application topically as needed.    . DULoxetine (CYMBALTA) 60 MG capsule Take 60 mg by mouth daily.    Marland Kitchen. ezetimibe (ZETIA) 10 MG tablet Take 1 tablet (10 mg total) by mouth daily. 90 tablet 3  . loratadine-pseudoephedrine (CLARITIN-D 24-HOUR) 10-240 MG per 24 hr tablet Take 1 tablet by mouth daily.    . metoprolol succinate (TOPROL-XL) 25 MG 24 hr tablet Take 25 mg by mouth daily.    . MULTIPLE VITAMIN PO Take 1 tablet by mouth daily.    . nitroGLYCERIN (NITROSTAT) 0.4 MG SL tablet Place 0.4 mg under the tongue every 5 (five) minutes as needed.    Marland Kitchen. omeprazole (PRILOSEC) 20 MG capsule Take 20 mg by mouth daily.    Marland Kitchen. oxycodone (ROXICODONE) 30 MG immediate  release tablet Take 30 mg by mouth every 4 (four) hours as needed for pain.    . prasugrel (EFFIENT) 10 MG TABS tablet Take 10 mg by mouth daily.     No current facility-administered medications for this visit.     Past Surgical History  Procedure Laterality Date  . Coronary artery bypass graft      cath bare metal stenting of mid right coronary artery  . Coronary stent placement       Allergies  Allergen Reactions  . Ampicillin     REACTION: Headache, diarrhea      Family History  Problem Relation Age of Onset  . Coronary artery disease Mother   . Coronary artery disease Father      Social History Howard Nelson reports that he has been smoking Cigarettes.  He has been smoking about 1.00 pack per day. He does not have any smokeless tobacco history on file. Howard Nelson has no alcohol history on file.   Review of Systems CONSTITUTIONAL: No weight loss, fever, chills, weakness or fatigue.  HEENT: Eyes: No visual loss, blurred vision, double vision or yellow sclerae.No hearing loss, sneezing, congestion, runny nose or sore throat.  SKIN: No rash  or itching.  CARDIOVASCULAR: per hpi RESPIRATORY: No shortness of breath, cough or sputum.  GASTROINTESTINAL: No anorexia, nausea, vomiting or diarrhea. No abdominal pain or blood.  GENITOURINARY: No burning on urination, no polyuria NEUROLOGICAL: No headache, dizziness, syncope, paralysis, ataxia, numbness or tingling in the extremities. No change in bowel or bladder control.  MUSCULOSKELETAL: No muscle, back pain, joint pain or stiffness.  LYMPHATICS: No enlarged nodes. No history of splenectomy.  PSYCHIATRIC: No history of depression or anxiety.  ENDOCRINOLOGIC: No reports of sweating, cold or heat intolerance. No polyuria or polydipsia.  Marland Kitchen   Physical Examination Filed Vitals:   10/25/15 1045  BP: 137/83  Pulse: 64   Filed Vitals:   10/25/15 1045  Height:  (1.778 m)  Weight: 199 lb (90.266 kg)    Gen: resting  comfortably, no acute distress HEENT: no scleral icterus, pupils equal round and reactive, no palptable cervical adenopathy,  CV: RRR, no m/r/g, no jvd Resp: Clear to auscultation bilaterally GI: abdomen is soft, non-tender, non-distended, normal bowel sounds, no hepatosplenomegaly MSK: extremities are warm, no edema.  Skin: warm, no rash Neuro:  no focal deficits Psych: appropriate affect   Diagnostic Studies 11/2006 Cath FINDINGS: Aortic pressure 112/71 with mean of 89. Left ventricular pressures 107/4 with an end-diastolic pressure of 16.  CORONARY ANGIOGRAPHY: The left main stem is angiographically normal and bifurcates into the LAD and left circumflex.  The LAD is a large-caliber vessel that courses down the left ventricular apex. It has no significant angiographic disease throughout it's course. There are two diagonal branches, the first of which is a large caliber. There is a 50% ostial stenosis of the first diagonal. The second diagonal is medium caliber and is angiographically normal.  The left circumflex system is medium caliber. It gives off two marginal branches. The first marginal is medium caliber and has a 50% ostial stenosis. There is normal flow throughout that vessel. Second obtuse marginal is angiographically normal. The true AV groove circumflex is small caliber.  The right coronary artery is a large dominant vessel. It is tortuous in its proximal portion. It gives off a large PDA branch distally. It gives off a posterior AV segment that has four posterolateral branches. The mid right coronary artery has a severe focal stenosis of the least 95%. There is an RV marginal branch that arises from this region of high-grade stenosis. There is TIMI III flow throughout the vessel.  Left ventriculogram demonstrates low normal left ventricular function with an LVEF of 55%.  ASSESSMENT: 1. Severe single-vessel coronary  artery disease with high-grade focal  stenosis of the mid-right coronary artery. 2. Normal left ventricular function.  PLAN: As described above, PCI of the mid-right coronary artery was performed with a Liberte bare metal stent. This was post-dilated with a 4-0 noncompliant balloon, with an excellent angiographic result. The RV marginal branch did have compromised flow following the procedure. The RV marginal branch is approximately a 2.0-mm vessel, and I elected not to mechanically intervene on this vessel. Will place HowardSoderholm on a nitroglycerin drip and will treat him medically. I do not think he will have long-term sequelae from his compromised RV marginal branch, and his right coronary artery is a very large vessel that I did not want to risk with treating the side branch. He has been pretreated with clopidogrel. He should receive dual anti-platelet therapy with aspirin and clopidogrel. He needs indefinite aspirin and clopidogrel for a minimum of 30 days. He would benefit from 1 year  of clopidogrel, based on the cure data for non-stemi.  11/2006 Cath HEMODYNAMICS: AO 102/86.  CORONARIES: The left main was normal. The LAD had ostial 25% stenosis. There was a mid 25% stenosis. There was a moderate-size first diagonal which had ostial 25-30% stenosis. Second diagonal was small and normal. The circumflex essentially had a very large branching OM1. There was some proximal stenosis of 25%. The right coronary artery is a large dominant vessel. There were diffuse proximal luminal irregularities. There was a stented area across an acute marginal. This was widely patent. The acute marginal was also patent. There was a large PDA which was normal.  LEFT VENTRICLE: I did not cross the AO and did not do an LV injection.  CONCLUSION: Widely patent right coronary artery stent.  PLAN: The etiology for the patient's chest pain is not  clear. He will continue with secondary risk reduction and further evaluation by his primary care doctor.   10/2013 [facility] Cath   Equipment used:  1. 6 [place] JR 4 guide without sideholes  2. Short BMW wire  3. 2.5 x 12 mm trek balloon  4. 3.5 x 38 mm Promus premiere drug-eluting stent  5. 3.75 x 12 mm [place] trek balloon    Medications used:  1. Angiomax IV  2. Brilinta 180 mg p.o.  3. Intracoronary nitroglycerin    Description of procedure:    After diagnostic angiograms were performed a JR 4 guide was brought up   into the ostium of the right coronary artery provided adequate backup   support. The vessel was wired with a short BMW wire. A 2.5 x 12 mm trek   balloon was brought down and used to   predilate the lesion for a total of 40 seconds the maximum of 12   atmospheres with 2 inflations.    Next a 3.5 x 38 mm Promus premiere drug-eluting stent was deployed in the   proximal the mid right coronary artery at 16 atmospheres for 30 seconds.   Subsequently the stent was postdilated with a 3.75 x 12 mm [place] trek balloon   with 3 inflations a maximum of   18 atmospheres for a total of 60 seconds.    Finally the balloon and wire were removed. Final angiogram show no   evidence of stent malposition, vessel perforation, or dissection. There is   TIMI 3 flow down the vessel. The patient was chest pain-free.    A TR band was placed in the right wrist for hemostasis.    Conclusions  1. Successful PCI to the proximal right coronary artery as above with a   drug-eluting stent. Lesion length was 34 mm eccentric mild calcium no   thrombus. Lesion went from 80% pre-to 0% post residual. Pre-TIMI flow is   3, post TIMI flow is 3.  2. Right radial access.     Assessment and Plan  1. CAD - no current symptoms, continue risk factor modification and secondary prevention - can d/c effient  2. HTN - at goal, continue current meds  3. HL -  has not been on statin due to LFT elevation in the past - LFTs remain elevated. There is association with zetia and elevated LFTs as well, will d/c zetia  F/u 1 year      Antoine Poche, M.D.

## 2015-11-21 DIAGNOSIS — E8881 Metabolic syndrome: Secondary | ICD-10-CM | POA: Diagnosis not present

## 2015-11-21 DIAGNOSIS — M797 Fibromyalgia: Secondary | ICD-10-CM | POA: Diagnosis not present

## 2015-11-21 DIAGNOSIS — F17210 Nicotine dependence, cigarettes, uncomplicated: Secondary | ICD-10-CM | POA: Diagnosis not present

## 2015-11-21 DIAGNOSIS — D539 Nutritional anemia, unspecified: Secondary | ICD-10-CM | POA: Diagnosis not present

## 2015-11-21 DIAGNOSIS — I10 Essential (primary) hypertension: Secondary | ICD-10-CM | POA: Diagnosis not present

## 2015-11-21 DIAGNOSIS — K210 Gastro-esophageal reflux disease with esophagitis: Secondary | ICD-10-CM | POA: Diagnosis not present

## 2015-11-21 DIAGNOSIS — I25111 Atherosclerotic heart disease of native coronary artery with angina pectoris with documented spasm: Secondary | ICD-10-CM | POA: Diagnosis not present

## 2015-11-21 DIAGNOSIS — D519 Vitamin B12 deficiency anemia, unspecified: Secondary | ICD-10-CM | POA: Diagnosis not present

## 2015-11-27 DIAGNOSIS — R7989 Other specified abnormal findings of blood chemistry: Secondary | ICD-10-CM | POA: Diagnosis not present

## 2015-11-27 DIAGNOSIS — K760 Fatty (change of) liver, not elsewhere classified: Secondary | ICD-10-CM | POA: Diagnosis not present

## 2015-11-27 DIAGNOSIS — R109 Unspecified abdominal pain: Secondary | ICD-10-CM | POA: Diagnosis not present

## 2015-11-27 DIAGNOSIS — R945 Abnormal results of liver function studies: Secondary | ICD-10-CM | POA: Diagnosis not present

## 2015-11-27 DIAGNOSIS — Z9049 Acquired absence of other specified parts of digestive tract: Secondary | ICD-10-CM | POA: Diagnosis not present

## 2015-11-28 DIAGNOSIS — F331 Major depressive disorder, recurrent, moderate: Secondary | ICD-10-CM | POA: Diagnosis not present

## 2015-12-11 DIAGNOSIS — K210 Gastro-esophageal reflux disease with esophagitis: Secondary | ICD-10-CM | POA: Diagnosis not present

## 2015-12-11 DIAGNOSIS — E8881 Metabolic syndrome: Secondary | ICD-10-CM | POA: Diagnosis not present

## 2015-12-11 DIAGNOSIS — E782 Mixed hyperlipidemia: Secondary | ICD-10-CM | POA: Diagnosis not present

## 2015-12-11 DIAGNOSIS — I10 Essential (primary) hypertension: Secondary | ICD-10-CM | POA: Diagnosis not present

## 2015-12-11 DIAGNOSIS — E119 Type 2 diabetes mellitus without complications: Secondary | ICD-10-CM | POA: Diagnosis not present

## 2015-12-17 DIAGNOSIS — I25111 Atherosclerotic heart disease of native coronary artery with angina pectoris with documented spasm: Secondary | ICD-10-CM | POA: Diagnosis not present

## 2015-12-17 DIAGNOSIS — K580 Irritable bowel syndrome with diarrhea: Secondary | ICD-10-CM | POA: Diagnosis not present

## 2015-12-17 DIAGNOSIS — E119 Type 2 diabetes mellitus without complications: Secondary | ICD-10-CM | POA: Diagnosis not present

## 2015-12-17 DIAGNOSIS — K219 Gastro-esophageal reflux disease without esophagitis: Secondary | ICD-10-CM | POA: Diagnosis not present

## 2015-12-17 DIAGNOSIS — E782 Mixed hyperlipidemia: Secondary | ICD-10-CM | POA: Diagnosis not present

## 2015-12-17 DIAGNOSIS — M797 Fibromyalgia: Secondary | ICD-10-CM | POA: Diagnosis not present

## 2015-12-17 DIAGNOSIS — E8881 Metabolic syndrome: Secondary | ICD-10-CM | POA: Diagnosis not present

## 2015-12-17 DIAGNOSIS — M545 Low back pain: Secondary | ICD-10-CM | POA: Diagnosis not present

## 2015-12-17 DIAGNOSIS — J301 Allergic rhinitis due to pollen: Secondary | ICD-10-CM | POA: Diagnosis not present

## 2015-12-17 DIAGNOSIS — F331 Major depressive disorder, recurrent, moderate: Secondary | ICD-10-CM | POA: Diagnosis not present

## 2015-12-17 DIAGNOSIS — M1611 Unilateral primary osteoarthritis, right hip: Secondary | ICD-10-CM | POA: Diagnosis not present

## 2015-12-17 DIAGNOSIS — I10 Essential (primary) hypertension: Secondary | ICD-10-CM | POA: Diagnosis not present

## 2015-12-17 DIAGNOSIS — F17210 Nicotine dependence, cigarettes, uncomplicated: Secondary | ICD-10-CM | POA: Diagnosis not present

## 2016-03-25 DIAGNOSIS — F331 Major depressive disorder, recurrent, moderate: Secondary | ICD-10-CM | POA: Diagnosis not present

## 2016-03-25 DIAGNOSIS — F17210 Nicotine dependence, cigarettes, uncomplicated: Secondary | ICD-10-CM | POA: Diagnosis not present

## 2016-03-25 DIAGNOSIS — E8881 Metabolic syndrome: Secondary | ICD-10-CM | POA: Diagnosis not present

## 2016-03-25 DIAGNOSIS — I25111 Atherosclerotic heart disease of native coronary artery with angina pectoris with documented spasm: Secondary | ICD-10-CM | POA: Diagnosis not present

## 2016-03-25 DIAGNOSIS — K210 Gastro-esophageal reflux disease with esophagitis: Secondary | ICD-10-CM | POA: Diagnosis not present

## 2016-03-25 DIAGNOSIS — E782 Mixed hyperlipidemia: Secondary | ICD-10-CM | POA: Diagnosis not present

## 2016-03-25 DIAGNOSIS — E119 Type 2 diabetes mellitus without complications: Secondary | ICD-10-CM | POA: Diagnosis not present

## 2016-03-28 DIAGNOSIS — Z1389 Encounter for screening for other disorder: Secondary | ICD-10-CM | POA: Diagnosis not present

## 2016-03-28 DIAGNOSIS — M545 Low back pain: Secondary | ICD-10-CM | POA: Diagnosis not present

## 2016-03-28 DIAGNOSIS — J301 Allergic rhinitis due to pollen: Secondary | ICD-10-CM | POA: Diagnosis not present

## 2016-03-28 DIAGNOSIS — E782 Mixed hyperlipidemia: Secondary | ICD-10-CM | POA: Diagnosis not present

## 2016-03-28 DIAGNOSIS — E119 Type 2 diabetes mellitus without complications: Secondary | ICD-10-CM | POA: Diagnosis not present

## 2016-03-28 DIAGNOSIS — I10 Essential (primary) hypertension: Secondary | ICD-10-CM | POA: Diagnosis not present

## 2016-03-28 DIAGNOSIS — I25111 Atherosclerotic heart disease of native coronary artery with angina pectoris with documented spasm: Secondary | ICD-10-CM | POA: Diagnosis not present

## 2016-03-28 DIAGNOSIS — K219 Gastro-esophageal reflux disease without esophagitis: Secondary | ICD-10-CM | POA: Diagnosis not present

## 2016-03-28 DIAGNOSIS — F331 Major depressive disorder, recurrent, moderate: Secondary | ICD-10-CM | POA: Diagnosis not present

## 2016-03-28 DIAGNOSIS — K580 Irritable bowel syndrome with diarrhea: Secondary | ICD-10-CM | POA: Diagnosis not present

## 2016-03-28 DIAGNOSIS — E8881 Metabolic syndrome: Secondary | ICD-10-CM | POA: Diagnosis not present

## 2016-03-28 DIAGNOSIS — F17210 Nicotine dependence, cigarettes, uncomplicated: Secondary | ICD-10-CM | POA: Diagnosis not present

## 2016-03-28 DIAGNOSIS — M797 Fibromyalgia: Secondary | ICD-10-CM | POA: Diagnosis not present

## 2016-03-29 DIAGNOSIS — Z955 Presence of coronary angioplasty implant and graft: Secondary | ICD-10-CM | POA: Diagnosis not present

## 2016-03-29 DIAGNOSIS — I2510 Atherosclerotic heart disease of native coronary artery without angina pectoris: Secondary | ICD-10-CM | POA: Diagnosis not present

## 2016-03-29 DIAGNOSIS — F17200 Nicotine dependence, unspecified, uncomplicated: Secondary | ICD-10-CM | POA: Diagnosis not present

## 2016-03-29 DIAGNOSIS — Z79899 Other long term (current) drug therapy: Secondary | ICD-10-CM | POA: Diagnosis not present

## 2016-03-29 DIAGNOSIS — M545 Low back pain: Secondary | ICD-10-CM | POA: Diagnosis not present

## 2016-03-29 DIAGNOSIS — I10 Essential (primary) hypertension: Secondary | ICD-10-CM | POA: Diagnosis not present

## 2016-03-29 DIAGNOSIS — Z7982 Long term (current) use of aspirin: Secondary | ICD-10-CM | POA: Diagnosis not present

## 2016-03-29 DIAGNOSIS — I252 Old myocardial infarction: Secondary | ICD-10-CM | POA: Diagnosis not present

## 2016-04-01 DIAGNOSIS — M5126 Other intervertebral disc displacement, lumbar region: Secondary | ICD-10-CM | POA: Diagnosis not present

## 2016-04-01 DIAGNOSIS — M47816 Spondylosis without myelopathy or radiculopathy, lumbar region: Secondary | ICD-10-CM | POA: Diagnosis not present

## 2016-04-01 DIAGNOSIS — M79606 Pain in leg, unspecified: Secondary | ICD-10-CM | POA: Diagnosis not present

## 2016-04-02 DIAGNOSIS — M545 Low back pain: Secondary | ICD-10-CM | POA: Diagnosis not present

## 2016-04-10 DIAGNOSIS — Z7984 Long term (current) use of oral hypoglycemic drugs: Secondary | ICD-10-CM | POA: Diagnosis not present

## 2016-04-10 DIAGNOSIS — K219 Gastro-esophageal reflux disease without esophagitis: Secondary | ICD-10-CM | POA: Diagnosis not present

## 2016-04-10 DIAGNOSIS — K644 Residual hemorrhoidal skin tags: Secondary | ICD-10-CM | POA: Diagnosis not present

## 2016-04-10 DIAGNOSIS — M1990 Unspecified osteoarthritis, unspecified site: Secondary | ICD-10-CM | POA: Diagnosis not present

## 2016-04-10 DIAGNOSIS — F17200 Nicotine dependence, unspecified, uncomplicated: Secondary | ICD-10-CM | POA: Diagnosis not present

## 2016-04-10 DIAGNOSIS — Z79899 Other long term (current) drug therapy: Secondary | ICD-10-CM | POA: Diagnosis not present

## 2016-04-10 DIAGNOSIS — Z880 Allergy status to penicillin: Secondary | ICD-10-CM | POA: Diagnosis not present

## 2016-04-10 DIAGNOSIS — I2510 Atherosclerotic heart disease of native coronary artery without angina pectoris: Secondary | ICD-10-CM | POA: Diagnosis not present

## 2016-04-10 DIAGNOSIS — Z7951 Long term (current) use of inhaled steroids: Secondary | ICD-10-CM | POA: Diagnosis not present

## 2016-04-10 DIAGNOSIS — Z7982 Long term (current) use of aspirin: Secondary | ICD-10-CM | POA: Diagnosis not present

## 2016-04-10 DIAGNOSIS — Z881 Allergy status to other antibiotic agents status: Secondary | ICD-10-CM | POA: Diagnosis not present

## 2016-04-10 DIAGNOSIS — Z1211 Encounter for screening for malignant neoplasm of colon: Secondary | ICD-10-CM | POA: Diagnosis not present

## 2016-04-10 DIAGNOSIS — E119 Type 2 diabetes mellitus without complications: Secondary | ICD-10-CM | POA: Diagnosis not present

## 2016-04-10 DIAGNOSIS — I252 Old myocardial infarction: Secondary | ICD-10-CM | POA: Diagnosis not present

## 2016-04-10 DIAGNOSIS — I10 Essential (primary) hypertension: Secondary | ICD-10-CM | POA: Diagnosis not present

## 2016-06-23 DIAGNOSIS — E8881 Metabolic syndrome: Secondary | ICD-10-CM | POA: Diagnosis not present

## 2016-06-23 DIAGNOSIS — K210 Gastro-esophageal reflux disease with esophagitis: Secondary | ICD-10-CM | POA: Diagnosis not present

## 2016-06-23 DIAGNOSIS — E782 Mixed hyperlipidemia: Secondary | ICD-10-CM | POA: Diagnosis not present

## 2016-06-23 DIAGNOSIS — I10 Essential (primary) hypertension: Secondary | ICD-10-CM | POA: Diagnosis not present

## 2016-06-23 DIAGNOSIS — E119 Type 2 diabetes mellitus without complications: Secondary | ICD-10-CM | POA: Diagnosis not present

## 2016-06-23 DIAGNOSIS — M1990 Unspecified osteoarthritis, unspecified site: Secondary | ICD-10-CM | POA: Diagnosis not present

## 2016-06-25 DIAGNOSIS — J301 Allergic rhinitis due to pollen: Secondary | ICD-10-CM | POA: Diagnosis not present

## 2016-06-25 DIAGNOSIS — K580 Irritable bowel syndrome with diarrhea: Secondary | ICD-10-CM | POA: Diagnosis not present

## 2016-06-25 DIAGNOSIS — E782 Mixed hyperlipidemia: Secondary | ICD-10-CM | POA: Diagnosis not present

## 2016-06-25 DIAGNOSIS — E8881 Metabolic syndrome: Secondary | ICD-10-CM | POA: Diagnosis not present

## 2016-06-25 DIAGNOSIS — F331 Major depressive disorder, recurrent, moderate: Secondary | ICD-10-CM | POA: Diagnosis not present

## 2016-06-25 DIAGNOSIS — F17210 Nicotine dependence, cigarettes, uncomplicated: Secondary | ICD-10-CM | POA: Diagnosis not present

## 2016-06-25 DIAGNOSIS — M797 Fibromyalgia: Secondary | ICD-10-CM | POA: Diagnosis not present

## 2016-06-25 DIAGNOSIS — M545 Low back pain: Secondary | ICD-10-CM | POA: Diagnosis not present

## 2016-06-25 DIAGNOSIS — K219 Gastro-esophageal reflux disease without esophagitis: Secondary | ICD-10-CM | POA: Diagnosis not present

## 2016-06-25 DIAGNOSIS — I10 Essential (primary) hypertension: Secondary | ICD-10-CM | POA: Diagnosis not present

## 2016-06-25 DIAGNOSIS — E119 Type 2 diabetes mellitus without complications: Secondary | ICD-10-CM | POA: Diagnosis not present

## 2016-06-25 DIAGNOSIS — I25111 Atherosclerotic heart disease of native coronary artery with angina pectoris with documented spasm: Secondary | ICD-10-CM | POA: Diagnosis not present

## 2016-10-20 DIAGNOSIS — E119 Type 2 diabetes mellitus without complications: Secondary | ICD-10-CM | POA: Diagnosis not present

## 2016-10-20 DIAGNOSIS — E8881 Metabolic syndrome: Secondary | ICD-10-CM | POA: Diagnosis not present

## 2016-10-20 DIAGNOSIS — K210 Gastro-esophageal reflux disease with esophagitis: Secondary | ICD-10-CM | POA: Diagnosis not present

## 2016-10-20 DIAGNOSIS — E782 Mixed hyperlipidemia: Secondary | ICD-10-CM | POA: Diagnosis not present

## 2016-10-20 DIAGNOSIS — I10 Essential (primary) hypertension: Secondary | ICD-10-CM | POA: Diagnosis not present

## 2016-10-24 DIAGNOSIS — I10 Essential (primary) hypertension: Secondary | ICD-10-CM | POA: Diagnosis not present

## 2016-10-24 DIAGNOSIS — F331 Major depressive disorder, recurrent, moderate: Secondary | ICD-10-CM | POA: Diagnosis not present

## 2016-10-24 DIAGNOSIS — K219 Gastro-esophageal reflux disease without esophagitis: Secondary | ICD-10-CM | POA: Diagnosis not present

## 2016-10-24 DIAGNOSIS — M797 Fibromyalgia: Secondary | ICD-10-CM | POA: Diagnosis not present

## 2016-10-24 DIAGNOSIS — E782 Mixed hyperlipidemia: Secondary | ICD-10-CM | POA: Diagnosis not present

## 2016-10-24 DIAGNOSIS — E8881 Metabolic syndrome: Secondary | ICD-10-CM | POA: Diagnosis not present

## 2016-10-24 DIAGNOSIS — E119 Type 2 diabetes mellitus without complications: Secondary | ICD-10-CM | POA: Diagnosis not present

## 2016-10-24 DIAGNOSIS — J301 Allergic rhinitis due to pollen: Secondary | ICD-10-CM | POA: Diagnosis not present

## 2016-10-24 DIAGNOSIS — Z23 Encounter for immunization: Secondary | ICD-10-CM | POA: Diagnosis not present

## 2016-10-24 DIAGNOSIS — Z1212 Encounter for screening for malignant neoplasm of rectum: Secondary | ICD-10-CM | POA: Diagnosis not present

## 2016-10-24 DIAGNOSIS — I25111 Atherosclerotic heart disease of native coronary artery with angina pectoris with documented spasm: Secondary | ICD-10-CM | POA: Diagnosis not present

## 2016-10-24 DIAGNOSIS — M545 Low back pain: Secondary | ICD-10-CM | POA: Diagnosis not present

## 2016-10-24 DIAGNOSIS — F17210 Nicotine dependence, cigarettes, uncomplicated: Secondary | ICD-10-CM | POA: Diagnosis not present

## 2016-10-24 DIAGNOSIS — K580 Irritable bowel syndrome with diarrhea: Secondary | ICD-10-CM | POA: Diagnosis not present

## 2016-11-21 DIAGNOSIS — I10 Essential (primary) hypertension: Secondary | ICD-10-CM | POA: Diagnosis not present

## 2016-11-21 DIAGNOSIS — Z955 Presence of coronary angioplasty implant and graft: Secondary | ICD-10-CM | POA: Diagnosis not present

## 2016-11-21 DIAGNOSIS — R079 Chest pain, unspecified: Secondary | ICD-10-CM | POA: Diagnosis not present

## 2016-11-21 DIAGNOSIS — K219 Gastro-esophageal reflux disease without esophagitis: Secondary | ICD-10-CM | POA: Diagnosis not present

## 2016-11-21 DIAGNOSIS — Z79899 Other long term (current) drug therapy: Secondary | ICD-10-CM | POA: Diagnosis not present

## 2016-11-21 DIAGNOSIS — F17200 Nicotine dependence, unspecified, uncomplicated: Secondary | ICD-10-CM | POA: Diagnosis not present

## 2016-11-21 DIAGNOSIS — R202 Paresthesia of skin: Secondary | ICD-10-CM | POA: Diagnosis not present

## 2016-11-21 DIAGNOSIS — Z7982 Long term (current) use of aspirin: Secondary | ICD-10-CM | POA: Diagnosis not present

## 2016-11-21 DIAGNOSIS — I2510 Atherosclerotic heart disease of native coronary artery without angina pectoris: Secondary | ICD-10-CM | POA: Diagnosis not present

## 2016-11-21 DIAGNOSIS — Z7984 Long term (current) use of oral hypoglycemic drugs: Secondary | ICD-10-CM | POA: Diagnosis not present

## 2016-11-21 DIAGNOSIS — E119 Type 2 diabetes mellitus without complications: Secondary | ICD-10-CM | POA: Diagnosis not present

## 2016-11-21 DIAGNOSIS — Z9049 Acquired absence of other specified parts of digestive tract: Secondary | ICD-10-CM | POA: Diagnosis not present

## 2016-12-04 DIAGNOSIS — K210 Gastro-esophageal reflux disease with esophagitis: Secondary | ICD-10-CM | POA: Diagnosis not present

## 2016-12-04 DIAGNOSIS — M1990 Unspecified osteoarthritis, unspecified site: Secondary | ICD-10-CM | POA: Diagnosis not present

## 2016-12-04 DIAGNOSIS — E8881 Metabolic syndrome: Secondary | ICD-10-CM | POA: Diagnosis not present

## 2016-12-04 DIAGNOSIS — E782 Mixed hyperlipidemia: Secondary | ICD-10-CM | POA: Diagnosis not present

## 2016-12-04 DIAGNOSIS — E119 Type 2 diabetes mellitus without complications: Secondary | ICD-10-CM | POA: Diagnosis not present

## 2016-12-05 DIAGNOSIS — Z79891 Long term (current) use of opiate analgesic: Secondary | ICD-10-CM | POA: Diagnosis not present

## 2016-12-05 DIAGNOSIS — G894 Chronic pain syndrome: Secondary | ICD-10-CM | POA: Diagnosis not present

## 2016-12-05 DIAGNOSIS — M542 Cervicalgia: Secondary | ICD-10-CM | POA: Diagnosis not present

## 2016-12-10 DIAGNOSIS — F17210 Nicotine dependence, cigarettes, uncomplicated: Secondary | ICD-10-CM | POA: Diagnosis not present

## 2016-12-10 DIAGNOSIS — E8881 Metabolic syndrome: Secondary | ICD-10-CM | POA: Diagnosis not present

## 2016-12-10 DIAGNOSIS — M545 Low back pain: Secondary | ICD-10-CM | POA: Diagnosis not present

## 2016-12-10 DIAGNOSIS — E782 Mixed hyperlipidemia: Secondary | ICD-10-CM | POA: Diagnosis not present

## 2016-12-10 DIAGNOSIS — M797 Fibromyalgia: Secondary | ICD-10-CM | POA: Diagnosis not present

## 2016-12-10 DIAGNOSIS — Z0001 Encounter for general adult medical examination with abnormal findings: Secondary | ICD-10-CM | POA: Diagnosis not present

## 2016-12-10 DIAGNOSIS — I10 Essential (primary) hypertension: Secondary | ICD-10-CM | POA: Diagnosis not present

## 2016-12-10 DIAGNOSIS — J301 Allergic rhinitis due to pollen: Secondary | ICD-10-CM | POA: Diagnosis not present

## 2016-12-29 DIAGNOSIS — M79604 Pain in right leg: Secondary | ICD-10-CM | POA: Diagnosis not present

## 2016-12-29 DIAGNOSIS — M5430 Sciatica, unspecified side: Secondary | ICD-10-CM | POA: Diagnosis not present

## 2016-12-29 DIAGNOSIS — M5127 Other intervertebral disc displacement, lumbosacral region: Secondary | ICD-10-CM | POA: Diagnosis not present

## 2016-12-29 DIAGNOSIS — M79605 Pain in left leg: Secondary | ICD-10-CM | POA: Diagnosis not present

## 2016-12-29 DIAGNOSIS — M4306 Spondylolysis, lumbar region: Secondary | ICD-10-CM | POA: Diagnosis not present

## 2016-12-29 DIAGNOSIS — R200 Anesthesia of skin: Secondary | ICD-10-CM | POA: Diagnosis not present

## 2016-12-29 DIAGNOSIS — M5126 Other intervertebral disc displacement, lumbar region: Secondary | ICD-10-CM | POA: Diagnosis not present

## 2016-12-29 DIAGNOSIS — M48061 Spinal stenosis, lumbar region without neurogenic claudication: Secondary | ICD-10-CM | POA: Diagnosis not present

## 2016-12-31 DIAGNOSIS — M25511 Pain in right shoulder: Secondary | ICD-10-CM | POA: Diagnosis not present

## 2016-12-31 DIAGNOSIS — M545 Low back pain: Secondary | ICD-10-CM | POA: Diagnosis not present

## 2016-12-31 DIAGNOSIS — M542 Cervicalgia: Secondary | ICD-10-CM | POA: Diagnosis not present

## 2016-12-31 DIAGNOSIS — M25551 Pain in right hip: Secondary | ICD-10-CM | POA: Diagnosis not present

## 2016-12-31 DIAGNOSIS — M79661 Pain in right lower leg: Secondary | ICD-10-CM | POA: Diagnosis not present

## 2016-12-31 DIAGNOSIS — M25512 Pain in left shoulder: Secondary | ICD-10-CM | POA: Diagnosis not present

## 2016-12-31 DIAGNOSIS — G894 Chronic pain syndrome: Secondary | ICD-10-CM | POA: Diagnosis not present

## 2016-12-31 DIAGNOSIS — Z79891 Long term (current) use of opiate analgesic: Secondary | ICD-10-CM | POA: Diagnosis not present

## 2017-01-13 DIAGNOSIS — E119 Type 2 diabetes mellitus without complications: Secondary | ICD-10-CM | POA: Diagnosis not present

## 2017-01-13 DIAGNOSIS — I10 Essential (primary) hypertension: Secondary | ICD-10-CM | POA: Diagnosis not present

## 2017-01-13 DIAGNOSIS — E8881 Metabolic syndrome: Secondary | ICD-10-CM | POA: Diagnosis not present

## 2017-01-13 DIAGNOSIS — E782 Mixed hyperlipidemia: Secondary | ICD-10-CM | POA: Diagnosis not present

## 2017-01-22 DIAGNOSIS — F4320 Adjustment disorder, unspecified: Secondary | ICD-10-CM | POA: Diagnosis not present

## 2017-01-27 DIAGNOSIS — R072 Precordial pain: Secondary | ICD-10-CM | POA: Diagnosis not present

## 2017-01-27 DIAGNOSIS — E119 Type 2 diabetes mellitus without complications: Secondary | ICD-10-CM | POA: Diagnosis not present

## 2017-01-27 DIAGNOSIS — K219 Gastro-esophageal reflux disease without esophagitis: Secondary | ICD-10-CM | POA: Diagnosis not present

## 2017-01-27 DIAGNOSIS — M797 Fibromyalgia: Secondary | ICD-10-CM | POA: Diagnosis not present

## 2017-01-27 DIAGNOSIS — Z720 Tobacco use: Secondary | ICD-10-CM | POA: Diagnosis not present

## 2017-01-27 DIAGNOSIS — R079 Chest pain, unspecified: Secondary | ICD-10-CM | POA: Diagnosis not present

## 2017-01-27 DIAGNOSIS — E785 Hyperlipidemia, unspecified: Secondary | ICD-10-CM | POA: Diagnosis not present

## 2017-01-29 ENCOUNTER — Other Ambulatory Visit: Payer: Self-pay

## 2017-01-29 ENCOUNTER — Encounter: Payer: Self-pay | Admitting: Adult Health

## 2017-01-29 ENCOUNTER — Ambulatory Visit (HOSPITAL_COMMUNITY)
Admission: RE | Admit: 2017-01-29 | Discharge: 2017-01-29 | Disposition: A | Discharge status: Home / Self Care | Payer: Medicare Other | Source: Ambulatory Visit | Attending: Adult Health | Admitting: Adult Health

## 2017-01-29 ENCOUNTER — Encounter (HOSPITAL_COMMUNITY): Payer: Self-pay | Admitting: Emergency Medicine

## 2017-01-29 ENCOUNTER — Ambulatory Visit (INDEPENDENT_AMBULATORY_CARE_PROVIDER_SITE_OTHER): Payer: Medicare Other | Admitting: Adult Health

## 2017-01-29 ENCOUNTER — Other Ambulatory Visit (HOSPITAL_COMMUNITY)
Admission: RE | Admit: 2017-01-29 | Discharge: 2017-01-29 | Disposition: A | Discharge status: Home / Self Care | Payer: Medicare Other | Source: Ambulatory Visit | Attending: Adult Health | Admitting: Adult Health

## 2017-01-29 ENCOUNTER — Inpatient Hospital Stay (HOSPITAL_COMMUNITY)
Admission: EM | Admit: 2017-01-29 | Discharge: 2017-01-31 | DRG: 303 | Disposition: A | Discharge status: Home / Self Care | Payer: Medicare Other | Attending: Cardiovascular Disease | Admitting: Cardiovascular Disease

## 2017-01-29 VITALS — BP 138/80 | HR 94 | Ht 70.0 in | Wt 199.0 lb

## 2017-01-29 DIAGNOSIS — Z7982 Long term (current) use of aspirin: Secondary | ICD-10-CM

## 2017-01-29 DIAGNOSIS — E785 Hyperlipidemia, unspecified: Secondary | ICD-10-CM | POA: Diagnosis present

## 2017-01-29 DIAGNOSIS — I200 Unstable angina: Secondary | ICD-10-CM | POA: Diagnosis present

## 2017-01-29 DIAGNOSIS — Z01818 Encounter for other preprocedural examination: Secondary | ICD-10-CM

## 2017-01-29 DIAGNOSIS — M797 Fibromyalgia: Secondary | ICD-10-CM | POA: Insufficient documentation

## 2017-01-29 DIAGNOSIS — Z955 Presence of coronary angioplasty implant and graft: Secondary | ICD-10-CM

## 2017-01-29 DIAGNOSIS — E7800 Pure hypercholesterolemia, unspecified: Secondary | ICD-10-CM

## 2017-01-29 DIAGNOSIS — I252 Old myocardial infarction: Secondary | ICD-10-CM

## 2017-01-29 DIAGNOSIS — I209 Angina pectoris, unspecified: Secondary | ICD-10-CM | POA: Diagnosis not present

## 2017-01-29 DIAGNOSIS — I249 Acute ischemic heart disease, unspecified: Secondary | ICD-10-CM | POA: Diagnosis not present

## 2017-01-29 DIAGNOSIS — I10 Essential (primary) hypertension: Secondary | ICD-10-CM

## 2017-01-29 DIAGNOSIS — Z7984 Long term (current) use of oral hypoglycemic drugs: Secondary | ICD-10-CM | POA: Insufficient documentation

## 2017-01-29 DIAGNOSIS — F17210 Nicotine dependence, cigarettes, uncomplicated: Secondary | ICD-10-CM

## 2017-01-29 DIAGNOSIS — I2510 Atherosclerotic heart disease of native coronary artery without angina pectoris: Secondary | ICD-10-CM

## 2017-01-29 DIAGNOSIS — Z720 Tobacco use: Secondary | ICD-10-CM

## 2017-01-29 DIAGNOSIS — E119 Type 2 diabetes mellitus without complications: Secondary | ICD-10-CM | POA: Insufficient documentation

## 2017-01-29 DIAGNOSIS — M5134 Other intervertebral disc degeneration, thoracic region: Secondary | ICD-10-CM | POA: Diagnosis present

## 2017-01-29 DIAGNOSIS — F17200 Nicotine dependence, unspecified, uncomplicated: Secondary | ICD-10-CM | POA: Diagnosis present

## 2017-01-29 DIAGNOSIS — I25110 Atherosclerotic heart disease of native coronary artery with unstable angina pectoris: Principal | ICD-10-CM | POA: Diagnosis present

## 2017-01-29 DIAGNOSIS — F329 Major depressive disorder, single episode, unspecified: Secondary | ICD-10-CM | POA: Diagnosis present

## 2017-01-29 DIAGNOSIS — R079 Chest pain, unspecified: Secondary | ICD-10-CM | POA: Diagnosis not present

## 2017-01-29 DIAGNOSIS — Z880 Allergy status to penicillin: Secondary | ICD-10-CM

## 2017-01-29 DIAGNOSIS — I25119 Atherosclerotic heart disease of native coronary artery with unspecified angina pectoris: Secondary | ICD-10-CM | POA: Insufficient documentation

## 2017-01-29 DIAGNOSIS — E118 Type 2 diabetes mellitus with unspecified complications: Secondary | ICD-10-CM

## 2017-01-29 DIAGNOSIS — Z79899 Other long term (current) drug therapy: Secondary | ICD-10-CM | POA: Insufficient documentation

## 2017-01-29 DIAGNOSIS — E1151 Type 2 diabetes mellitus with diabetic peripheral angiopathy without gangrene: Secondary | ICD-10-CM | POA: Diagnosis not present

## 2017-01-29 DIAGNOSIS — Z7951 Long term (current) use of inhaled steroids: Secondary | ICD-10-CM

## 2017-01-29 DIAGNOSIS — Z8249 Family history of ischemic heart disease and other diseases of the circulatory system: Secondary | ICD-10-CM

## 2017-01-29 LAB — CBC WITH DIFFERENTIAL/PLATELET
Basophils Absolute: 0 10*3/uL (ref 0.0–0.1)
Basophils Relative: 0 %
EOS ABS: 0.2 10*3/uL (ref 0.0–0.7)
Eosinophils Relative: 2 %
HCT: 42.1 % (ref 39.0–52.0)
HEMOGLOBIN: 14.6 g/dL (ref 13.0–17.0)
LYMPHS ABS: 2.5 10*3/uL (ref 0.7–4.0)
Lymphocytes Relative: 32 %
MCH: 31.3 pg (ref 26.0–34.0)
MCHC: 34.7 g/dL (ref 30.0–36.0)
MCV: 90.3 fL (ref 78.0–100.0)
MONOS PCT: 8 %
Monocytes Absolute: 0.6 10*3/uL (ref 0.1–1.0)
NEUTROS PCT: 58 %
Neutro Abs: 4.5 10*3/uL (ref 1.7–7.7)
Platelets: 202 10*3/uL (ref 150–400)
RBC: 4.66 MIL/uL (ref 4.22–5.81)
RDW: 12.8 % (ref 11.5–15.5)
WBC: 7.8 10*3/uL (ref 4.0–10.5)

## 2017-01-29 LAB — CBC
HCT: 40 % (ref 39.0–52.0)
HEMOGLOBIN: 14.2 g/dL (ref 13.0–17.0)
MCH: 32.3 pg (ref 26.0–34.0)
MCHC: 35.5 g/dL (ref 30.0–36.0)
MCV: 90.9 fL (ref 78.0–100.0)
PLATELETS: 195 10*3/uL (ref 150–400)
RBC: 4.4 MIL/uL (ref 4.22–5.81)
RDW: 12.7 % (ref 11.5–15.5)
WBC: 7.7 10*3/uL (ref 4.0–10.5)

## 2017-01-29 LAB — PROTIME-INR
INR: 0.91
INR: 0.85
Prothrombin Time: 12.2 seconds (ref 11.4–15.2)
Prothrombin Time: 11.6 seconds (ref 11.4–15.2)

## 2017-01-29 LAB — BASIC METABOLIC PANEL
Anion gap: 6 (ref 5–15)
BUN: 17 mg/dL (ref 6–20)
CHLORIDE: 102 mmol/L (ref 101–111)
CO2: 28 mmol/L (ref 22–32)
CREATININE: 0.78 mg/dL (ref 0.61–1.24)
Calcium: 9.1 mg/dL (ref 8.9–10.3)
GFR calc Af Amer: >60 mL/min (ref >60)
GFR calc non Af Amer: >60 mL/min (ref >60)
GLUCOSE: 150 mg/dL — AB (ref 65–99)
POTASSIUM: 4.5 mmol/L (ref 3.5–5.1)
SODIUM: 136 mmol/L (ref 135–145)

## 2017-01-29 LAB — COMPREHENSIVE METABOLIC PANEL
ALBUMIN: 3.7 g/dL (ref 3.5–5.0)
ALK PHOS: 89 U/L (ref 38–126)
ALT: 92 U/L — AB (ref 17–63)
AST: 41 U/L (ref 15–41)
Anion gap: 6 (ref 5–15)
BUN: 17 mg/dL (ref 6–20)
CALCIUM: 8.7 mg/dL — AB (ref 8.9–10.3)
CO2: 26 mmol/L (ref 22–32)
Chloride: 106 mmol/L (ref 101–111)
Creatinine, Ser: 0.81 mg/dL (ref 0.61–1.24)
GFR calc Af Amer: >60 mL/min (ref >60)
GFR calc non Af Amer: >60 mL/min (ref >60)
GLUCOSE: 224 mg/dL — AB (ref 65–99)
Potassium: 4 mmol/L (ref 3.5–5.1)
Sodium: 138 mmol/L (ref 135–145)
Total Bilirubin: 0.4 mg/dL (ref 0.3–1.2)
Total Protein: 6.6 g/dL (ref 6.5–8.1)

## 2017-01-29 LAB — DIFFERENTIAL
Basophils Absolute: 0 10*3/uL (ref 0.0–0.1)
Basophils Relative: 0 %
EOS PCT: 3 %
Eosinophils Absolute: 0.2 10*3/uL (ref 0.0–0.7)
LYMPHS ABS: 2.5 10*3/uL (ref 0.7–4.0)
Lymphocytes Relative: 32 %
MONO ABS: 0.6 10*3/uL (ref 0.1–1.0)
MONOS PCT: 7 %
Neutro Abs: 4.4 10*3/uL (ref 1.7–7.7)
Neutrophils Relative %: 58 %

## 2017-01-29 LAB — TROPONIN I: Troponin I: <0.03 ng/mL (ref <0.03)

## 2017-01-29 LAB — APTT: aPTT: 26 seconds (ref 24–36)

## 2017-01-29 MED ORDER — SODIUM CHLORIDE 0.9 % IV SOLN
10.0000 mL/h | INTRAVENOUS | Status: DC
  Administered 2017-01-29: 20 mL/h via INTRAVENOUS

## 2017-01-29 MED ORDER — NITROGLYCERIN 2 % TD OINT
1.0000 [in_us] | TOPICAL_OINTMENT | Freq: Four times a day (QID) | TRANSDERMAL | Status: DC
  Administered 2017-01-29: 1 [in_us] via TOPICAL
  Filled 2017-01-29: qty 1

## 2017-01-29 MED ORDER — ASPIRIN 81 MG PO CHEW
324.0000 mg | CHEWABLE_TABLET | Freq: Once | ORAL | Status: AC
  Administered 2017-01-29: 324 mg via ORAL
  Filled 2017-01-29: qty 4

## 2017-01-29 MED ORDER — PRAVASTATIN SODIUM 40 MG PO TABS: 40.0000 mg | ORAL_TABLET | Freq: Every evening | ORAL | 3 refills | Status: DC

## 2017-01-29 NOTE — Patient Instructions (Signed)
Medication Instructions:  START PRAVACHOL 40 MG DAILY   Labwork: Your physician recommends that you return for lab work in: TODAY    Testing/Procedures: Your physician has requested that you have a cardiac catheterization. Cardiac catheterization is used to diagnose and/or treat various heart conditions. Doctors may recommend this procedure for a number of different reasons. The most common reason is to evaluate chest pain. Chest pain can be a symptom of coronary artery disease (CAD), and cardiac catheterization can show whether plaque is narrowing or blocking your heart's arteries. This procedure is also used to evaluate the valves, as well as measure the blood flow and oxygen levels in different parts of your heart. For further information please visit [email or web address] Please follow instruction sheet, as given.  A chest x-ray takes a picture of the organs and structures inside the chest, including the heart, lungs, and blood vessels. This test can show several things, including, whether the heart is enlarges; whether fluid is building up in the lungs; and whether pacemaker / defibrillator leads are still in place.   Follow-Up: Your physician recommends that you schedule a follow-up appointment in: TO BE DETERMINED AFTER CATH   Any Other Special Instructions Will Be Listed Below (If Applicable).     If you need a refill on your cardiac medications before your next appointment, please call your pharmacy.

## 2017-01-29 NOTE — Progress Notes (Signed)
Cardiology Office Note   Date:  01/29/2017   ID:  Howard Nettles Sr., DOB 07-10-1961, MRN [id number]  PCP:  Donzetta Sprung, MD  Cardiologist: Arlington Calix, NP   Chief Complaint  Patient presents with  . Coronary Artery Disease  . Hypertension      History of Present Illness: Howard Raybourn. is a 56 y.o. male who presents for ongoing assessment and management of coronary artery disease, hypertension, hyperlipidemia, with other history to include depression, and fibromyalgia. The patient was last in the office by Dr. Wyline Mood on 10/15/2015 in the setting of routine cardiology appointment for CAD. He has a history of RCA stent in 2007 with recent cath in 2009 with patent coronary arteries. Blood pressure was well-controlled.  He was recently admitted to [facility] in the setting of recurrent chest pain. He left AMA. The patient has had recurrent chest pain, dyspnea on exertion, and fatigue which has worsened. States he cannot walk to his mailbox without having to stop and rest. The patient continues ongoing tobacco abuse with 1-1/2 packs of cigarettes a day, review of labs at recent hospitalization reveals uncontrolled hypercholesterolemia, uncontrolled diabetes,.  Labs: Glucose 257, creatinine 0.84, potassium 4.1, hemoglobin 15.8, hematocrit 44.6, total cholesterol 225, triglycerides 128, HDL 45, LDL 154, hemoglobin A1c 9.1.  Past Medical History:  Diagnosis Date  . Chest pain   . Coronary atherosclerosis of native coronary artery 2009   stent  . Depression   . Dyslipidemia   . Fibromyalgia   . HTN (hypertension)   . NSTEMI (non-ST elevated myocardial infarction) [facility])     Past Surgical History:  Procedure Laterality Date  . CORONARY ARTERY BYPASS GRAFT     cath bare metal stenting of mid right coronary artery  . CORONARY STENT PLACEMENT       Current Outpatient Prescriptions  Medication Sig Dispense Refill  . acetaminophen (TYLENOL)  500 MG tablet Take 500 mg by mouth as needed.    Marland Kitchen aspirin 81 MG tablet Take 81 mg by mouth daily.    . diclofenac sodium (VOLTAREN) 1 % GEL Apply 1 application topically as needed.    . DULoxetine (CYMBALTA) 60 MG capsule Take 60 mg by mouth daily.    . fluticasone (FLONASE) 50 MCG/ACT nasal spray Place 2 sprays into both nostrils daily.    Marland Kitchen glipiZIDE (GLUCOTROL) 10 MG tablet Take 10 mg by mouth daily before breakfast.    . lisinopril (PRINIVIL,ZESTRIL) 2.5 MG tablet Take 1 tablet by mouth daily.    Marland Kitchen loratadine-pseudoephedrine (CLARITIN-D 24-HOUR) 10-240 MG per 24 hr tablet Take 1 tablet by mouth daily as needed.     . metFORMIN (GLUCOPHAGE-XR) 500 MG 24 hr tablet Take 1,000 mg by mouth daily.  3  . metoprolol succinate (TOPROL-XL) 25 MG 24 hr tablet Take 25 mg by mouth daily.    . Multiple Minerals (CALCIUM/MAGNESIUM/ZINC PO) Take 2 tablets by mouth daily.    . nitroGLYCERIN (NITROSTAT) 0.4 MG SL tablet Place 0.4 mg under the tongue every 5 (five) minutes as needed.    Marland Kitchen omeprazole (PRILOSEC) 20 MG capsule Take 20 mg by mouth 2 (two) times daily before a meal.     . oxycodone (ROXICODONE) 30 MG immediate release tablet Take 30 mg by mouth every 4 (four) hours as needed for pain.    . pravastatin (PRAVACHOL) 40 MG tablet Take 1 tablet (40 mg total) by mouth every evening. 90 tablet 3   No  current facility-administered medications for this visit.     Allergies:   Ampicillin    Social History:  The patient  reports that he has been smoking Cigarettes.  He started smoking about 44 years ago. He has been smoking about 1.00 pack per day. He has never used smokeless tobacco. He reports that he does not use drugs.   Family History:  The patient's family history includes Coronary artery disease in his father and mother.    ROS: All other systems are reviewed and negative. Unless otherwise mentioned in H&P    PHYSICAL EXAM: VS:  BP 138/80   Pulse 94   Ht 5\' 10"  (1.778 m)   Wt 199 lb (90.3  kg)   SpO2 95%   BMI 28.55 kg/m  , BMI Body mass index is 28.55 kg/m. GEN: Well nourished, well developed, in no acute distress  HEENT: normal  Neck: no JVD, carotid bruits, or masses Cardiac: RRR; no murmurs, rubs, or gallops,no edema  Respiratory:  clear to auscultation bilaterally, normal work of breathing GI: soft, nontender, nondistended, + BS MS: no deformity or atrophy  Skin: warm and dry, no rash Neuro:  Strength and sensation are intact Psych: euthymic mood, full affect  Recent Labs: No results found for requested labs within last 8760 hours.    Lipid Panel No results found for: CHOL, TRIG, HDL, CHOLHDL, VLDL, LDLCALC, LDLDIRECT    Wt Readings from Last 3 Encounters:  01/29/17 199 lb (90.3 kg)  10/25/15 199 lb (90.3 kg)  01/12/15 202 lb 12.8 oz (92 kg)      Other studies Reviewed: Cardiac Cath 04/22/2011  CORONARY ANGIOGRAPHY:  The left main stem is angiographically normal and  bifurcates into the LAD and left circumflex.   The LAD is a large-caliber vessel that courses down the left ventricular  apex.  It has no significant angiographic disease throughout it's  course.  There are two diagonal branches, the first of which is a large  caliber.  There is a 50% ostial stenosis of the first diagonal.  The  second diagonal is medium caliber and is angiographically normal.   The left circumflex system is medium caliber.  It gives off two marginal  branches.  The first marginal is medium caliber and has a 50% ostial  stenosis.  There is normal flow throughout that vessel.  Second obtuse  marginal is angiographically normal.  The true AV groove circumflex is  small caliber.   The right coronary artery is a large dominant vessel.  It is tortuous in  its proximal portion.  It gives off a large PDA branch distally.  It  gives off a posterior AV segment that has four posterolateral branches.  The mid right coronary artery has a severe focal stenosis of the least   95%.  There is an RV marginal branch that arises from this region of  high-grade stenosis.  There is TIMI III flow throughout the vessel.   Left ventriculogram demonstrates low normal left ventricular function  with an LVEF of 55%.   ASSESSMENT:  1. Severe single-vessel coronary artery disease with high-grade focal      stenosis of the mid-right coronary artery.  2. Normal left ventricular function.   PLAN:  As described above, PCI of the mid-right coronary artery was  performed with a Liberte bare metal stent.  This was post-dilated with a  4-0 noncompliant balloon, with an excellent angiographic result.  The RV  marginal branch did have compromised flow following  the procedure.  The  RV marginal branch is approximately a 2.0-mm vessel, and I elected not  to mechanically intervene on this vessel.  Will place Howard Nelson on a  nitroglycerin drip and will treat him medically.  I do not think he will  have long-term sequelae from his compromised RV marginal branch, and his  right coronary artery is a very large vessel that I did not want to risk  with treating the side branch.  He has been pretreated with clopidogrel.  He should receive dual anti-platelet therapy with aspirin and  clopidogrel.  He needs indefinite aspirin and clopidogrel for a minimum  of 30 days.  He would benefit from 1 year of clopidogrel, based on the  cure data for non-stemi.  Cardiac Catheterization 04/22/2011 Cardiac Cath  CORONARIES:  The left main was normal.  The LAD had ostial 25% stenosis.  There was a mid 25% stenosis.  There was a moderate-size first diagonal  which had ostial 25-30% stenosis.  Second diagonal was small and normal.  The circumflex essentially had a very large branching OM1.  There was  some proximal stenosis of 25%.  The right coronary artery is a large  dominant vessel.  There were diffuse proximal luminal irregularities.  There was a stented area across an acute marginal.  This was  widely  patent.  The acute marginal was also patent.  There was a large PDA  which was normal.   LEFT VENTRICLE:  I did not cross the AO and did not do an LV injection.   CONCLUSION:  Widely patent right coronary artery stent.  ASSESSMENT AND PLAN:  1.CAD: Known stent to RCA. Recurrent chest pain typical for angina. He continues to have CVRFs of uncontrolled diabetes, hypercholesteremia, and ongoing tobacco abuse. I feel the patient will benefit from cardiac cath with to evaluate for progressive CAD. I have discussed this with the patient and with Dr. Purvis Sheffield, DOD, who agrees with my assessment and plan. The patient is willing to proceed with repeat cardiac cath. Planned for 03/03/2017 at 11 am with Dr. Excell Seltzer. He is instructed to refill NTG.   2. Hypercholesterolemia: Not well controlled. He states he is intolerant to statins as they make his liver enzymes increase. I will begin Pravachol 40 mg daily.    3. Diabetes: Not well controlled. Will need to follow up with PCP for diabetes management.    4. Ongoing tobacco abuse: He continues to smoke 1 1/2 ppd. I have strongly recommended that he quit.;   Current medicines are reviewed at length with the patient today.    Labs/ tests ordered today include:   Orders Placed This Encounter  Procedures  . DG Chest 2 View  . Basic Metabolic Panel (BMET)  . CBC with Differential  . INR/PT  . EKG 12-Lead     Disposition:   FU with post cath Signed, Joni Reining, NP  01/29/2017 4:25 PM    [facility] [address] [place], [place] [address] Phone: [phone]; Fax: [phone]

## 2017-01-29 NOTE — ED Triage Notes (Signed)
Intermittent chest pain for several weeks, evaluated several times for same including today at [facility] where he had blood work, [facility] teac[facility] and xray.  Pt states he was told is pain came back to come to the e.r.

## 2017-01-29 NOTE — Progress Notes (Signed)
Name: Howard NettlesBryan L Porreca Sr.    DOB: 1961/12/01  Age: 56 y.o.  MR#: [id number]       PCP:  Donzetta SprungERRY DANIEL, MD      Insurance: Payor: MEDICARE / Plan: MEDICARE PART A AND B / Product Type: *No Product type* /   CC:   No chief complaint on file.   VS Vitals:   01/29/17 1443  BP: 138/80  Pulse: 94  SpO2: 95%  Weight: 199 lb (90.3 kg)  Height: 5\' 10"  (1.778 m)    Weights Current Weight  01/29/17 199 lb (90.3 kg)  10/25/15 199 lb (90.3 kg)  01/12/15 202 lb 12.8 oz (92 kg)    Blood Pressure  BP Readings from Last 3 Encounters:  01/29/17 138/80  10/25/15 137/83  01/12/15 115/76     Admit date:  (Not on file) Last encounter with RMR:  Visit date not found   Allergy Ampicillin  Current Outpatient Prescriptions  Medication Sig Dispense Refill  . acetaminophen (TYLENOL) 500 MG tablet Take 500 mg by mouth as needed.    Marland Kitchen. aspirin 81 MG tablet Take 81 mg by mouth daily.    . diclofenac sodium (VOLTAREN) 1 % GEL Apply 1 application topically as needed.    . DULoxetine (CYMBALTA) 60 MG capsule Take 60 mg by mouth daily.    . fluticasone (FLONASE) 50 MCG/ACT nasal spray Place 2 sprays into both nostrils daily.    Marland Kitchen. glipiZIDE (GLUCOTROL) 10 MG tablet Take 10 mg by mouth daily before breakfast.    . lisinopril (PRINIVIL,ZESTRIL) 2.5 MG tablet Take 1 tablet by mouth daily.    Marland Kitchen. loratadine-pseudoephedrine (CLARITIN-D 24-HOUR) 10-240 MG per 24 hr tablet Take 1 tablet by mouth daily as needed.     . metFORMIN (GLUCOPHAGE-XR) 500 MG 24 hr tablet Take 1,000 mg by mouth daily.  3  . metoprolol succinate (TOPROL-XL) 25 MG 24 hr tablet Take 25 mg by mouth daily.    . Multiple Minerals (CALCIUM/MAGNESIUM/ZINC PO) Take 2 tablets by mouth daily.    . nitroGLYCERIN (NITROSTAT) 0.4 MG SL tablet Place 0.4 mg under the tongue every 5 (five) minutes as needed.    Marland Kitchen. omeprazole (PRILOSEC) 20 MG capsule Take 20 mg by mouth 2 (two) times daily before a meal.     . oxycodone (ROXICODONE) 30 MG immediate release  tablet Take 30 mg by mouth every 4 (four) hours as needed for pain.     No current facility-administered medications for this visit.     Discontinued Meds:    Medications Discontinued During This Encounter  Medication Reason  . Clindamycin Phosphate foam Error    Patient Active Problem List   Diagnosis Date Noted  . TOBACCO ABUSE 12/24/2009  . EPIGASTRIC PAIN 12/24/2009  . DYSLIPIDEMIA 06/15/2009  . DEPRESSION 06/15/2009  . HYPERTENSION 06/15/2009  . CORONARY ATHEROSCLEROSIS NATIVE CORONARY ARTERY 06/15/2009  . FIBROMYALGIA 06/15/2009  . CHEST PAIN 06/15/2009    LABS No results found for: NA, K, CL, CO2, GLUCOSE, BUN, CREATININE, CALCIUM, GFRNONAA, GFRAA CMP  No results found for: NA, K, CL, CO2, GLUCOSE, BUN, CREATININE, CALCIUM, PROT, ALBUMIN, AST, ALT, ALKPHOS, BILITOT, GFRNONAA, GFRAA  No results found for: WBC, HGB, HCT, MCV  Lipid Panel  No results found for: CHOL, TRIG, HDL, CHOLHDL, VLDL, LDLCALC, LDLDIRECT  ABG No results found for: PHART, PCO2ART, PO2ART, HCO3, TCO2, ACIDBASEDEF, O2SAT   No results found for: TSH BNP (last 3 results) No results for input(s): BNP in the last 8760 hours.  ProBNP (last 3 results) No results for input(s): PROBNP in the last 8760 hours.  Cardiac Panel (last 3 results) No results for input(s): CKTOTAL, CKMB, TROPONINI, RELINDX in the last 72 hours.  Iron/TIBC/Ferritin/ %Sat No results found for: IRON, TIBC, FERRITIN, IRONPCTSAT   EKG Orders placed or performed in visit on 01/29/17  . EKG 12-Lead     Prior Assessment and Plan Problem List as of 01/29/2017 Reviewed: 10/28/2015  6:08 PM by Dina Rich, MD     Cardiovascular and Mediastinum   HYPERTENSION   CORONARY ATHEROSCLEROSIS NATIVE CORONARY ARTERY     Musculoskeletal and Integument   FIBROMYALGIA     Other   DYSLIPIDEMIA   TOBACCO ABUSE   DEPRESSION   CHEST PAIN   EPIGASTRIC PAIN       Imaging: No results found.

## 2017-01-29 NOTE — ED Provider Notes (Signed)
[facility]-EMERGENCY DEPT Provider Note   CSN: [id number] Arrival date & time: 01/29/17  2255   By signing my name below, I, Bobbie Stack, attest that this documentation has been prepared under the direction and in the presence of Devoria Albe, MD. Electronically Signed: Bobbie Stack, Scribe. 01/29/17. 12:03 AM.  Time seen 23:11 PM  History   Chief Complaint Chief Complaint  Patient presents with  . Chest Pain    The history is provided by the patient. No language interpreter was used.   HPI Comments: Howard Banko. is a 56 y.o. male who presents to the Emergency Department complaining of left sided, sharp, constant chest pain that began about 45 minutes prior to his arrival tonight. He states he was walking out to the car to get his phone when the pain started. He states that he went to his cardiologist today and an EKG was performed. The patient is scheduled for a cardiac catheterization for 02/03/2017. He states that tonight he was waking into his house when he started to experience this pain.  He reports associated left arm numbness into his hand. He also reports SOB. He denies swelling in feet, nausea,Vomiting and diaphoresis. He took a NTG tablet while en route to the ED with some improvement.  He denies taking ASA tonight. He states that his pain is currently 4/10. His worse pain tonight was 8/10. He states he started having chest pain 5 weeks ago. He states the pain is always brought on by exertion. He states he gets the pain every day and sometimes up to 3 times a day. He states if he gets the pain and rest it can take up to 1-2 hours for the pain to go away. He denies drinking EtOH. He currently smokes a pack and a half a day. The patient has a hx of MI x 3 and stent x 2. His last sent was in 2014.  PCP Donzetta Sprung, MD Cardiology Dr Darl Householder  Past Medical History:  Diagnosis Date  . Chest pain   . Coronary atherosclerosis of native coronary artery 2009   stent  .  Depression   . Dyslipidemia   . Fibromyalgia   . HTN (hypertension)   . NSTEMI (non-ST elevated myocardial infarction) [facility])     Patient Active Problem List   Diagnosis Date Noted  . Angina pectoris, crescendo (HCC) 01/30/2017  . TOBACCO ABUSE 12/24/2009  . EPIGASTRIC PAIN 12/24/2009  . DYSLIPIDEMIA 06/15/2009  . DEPRESSION 06/15/2009  . HYPERTENSION 06/15/2009  . CORONARY ATHEROSCLEROSIS NATIVE CORONARY ARTERY 06/15/2009  . FIBROMYALGIA 06/15/2009  . CHEST PAIN 06/15/2009    Past Surgical History:  Procedure Laterality Date  . CORONARY STENT PLACEMENT         Home Medications    Prior to Admission medications   Medication Sig Start Date End Date Taking? Authorizing Provider  acetaminophen (TYLENOL) 500 MG tablet Take 500 mg by mouth as needed.   Yes Historical Provider, MD  aspirin 81 MG tablet Take 81 mg by mouth daily.   Yes Historical Provider, MD  diclofenac sodium (VOLTAREN) 1 % GEL Apply 1 application topically as needed.   Yes Historical Provider, MD  DULoxetine (CYMBALTA) 60 MG capsule Take 60 mg by mouth daily.   Yes Historical Provider, MD  fluticasone (FLONASE) 50 MCG/ACT nasal spray Place 2 sprays into both nostrils daily. 10/20/15  Yes Historical Provider, MD  glipiZIDE (GLUCOTROL) 10 MG tablet Take 10 mg by mouth daily before breakfast.   Yes Historical  Provider, MD  lisinopril (PRINIVIL,ZESTRIL) 2.5 MG tablet Take 1 tablet by mouth daily. 10/19/15  Yes Historical Provider, MD  loratadine-pseudoephedrine (CLARITIN-D 24-HOUR) 10-240 MG per 24 hr tablet Take 1 tablet by mouth daily as needed.    Yes Historical Provider, MD  metFORMIN (GLUCOPHAGE-XR) 500 MG 24 hr tablet Take 1,000 mg by mouth daily. 10/04/15  Yes Historical Provider, MD  metoprolol succinate (TOPROL-XL) 25 MG 24 hr tablet Take 25 mg by mouth daily.   Yes Historical Provider, MD  Multiple Minerals (CALCIUM/MAGNESIUM/ZINC PO) Take 2 tablets by mouth daily.   Yes Historical Provider, MD    nitroGLYCERIN (NITROSTAT) 0.4 MG SL tablet Place 0.4 mg under the tongue every 5 (five) minutes as needed.   Yes Historical Provider, MD  omeprazole (PRILOSEC) 20 MG capsule Take 20 mg by mouth 2 (two) times daily before a meal.    Yes Historical Provider, MD  oxycodone (ROXICODONE) 30 MG immediate release tablet Take 30 mg by mouth every 4 (four) hours as needed for pain.   Yes Historical Provider, MD  pravastatin (PRAVACHOL) 40 MG tablet Take 1 tablet (40 mg total) by mouth every evening. 01/29/17  Yes Jodelle GrossKathryn M Lawrence, NP    Family History Family History  Problem Relation Age of Onset  . Coronary artery disease Mother   . Coronary artery disease Father     Social History Social History  Substance Use Topics  . Smoking status: Current Every Day Smoker    Packs/day: 1.00    Types: Cigarettes    Start date: 12/01/1972  . Smokeless tobacco: Never Used  . Alcohol use Not on file  on disability for arthritis and fibromyalgia Smokes 1 1/2 ppd  Allergies   Ampicillin   Review of Systems Review of Systems  Constitutional: Negative for diaphoresis.  Respiratory: Positive for shortness of breath.   Cardiovascular: Positive for chest pain. Negative for leg swelling.  Gastrointestinal: Negative for nausea.  Neurological: Positive for numbness.  All other systems reviewed and are negative.    Physical Exam Updated Vital Signs BP 114/89   Pulse 85   Temp 98.5 F (36.9 C)   Resp 15   Ht 5\' 10"  (1.778 m)   Wt 199 lb (90.3 kg)   SpO2 93%   BMI 28.55 kg/m   Vital signs normal    Physical Exam  Constitutional: He is oriented to person, place, and time. He appears well-developed and well-nourished.  Non-toxic appearance. He does not appear ill. No distress.  HENT:  Head: Normocephalic and atraumatic.  Right Ear: External ear normal.  Left Ear: External ear normal.  Nose: Nose normal. No mucosal edema or rhinorrhea.  Mouth/Throat: Oropharynx is clear and moist and  mucous membranes are normal. No dental abscesses or uvula swelling.  Eyes: Conjunctivae and EOM are normal. Pupils are equal, round, and reactive to light.  Neck: Normal range of motion and full passive range of motion without pain. Neck supple.  Cardiovascular: Normal rate, regular rhythm and normal heart sounds.  Exam reveals no gallop and no friction rub.   No murmur heard. Pulmonary/Chest: Effort normal and breath sounds normal. No respiratory distress. He has no wheezes. He has no rhonchi. He has no rales. He exhibits no tenderness and no crepitus.  Abdominal: Soft. Normal appearance and bowel sounds are normal. He exhibits no distension. There is no tenderness. There is no rebound and no guarding.  Musculoskeletal: Normal range of motion. He exhibits no edema or tenderness.  Moves all extremities  well.   Neurological: He is alert and oriented to person, place, and time. He has normal strength. No cranial nerve deficit.  Skin: Skin is warm, dry and intact. No rash noted. No erythema. No pallor.  Psychiatric: He has a normal mood and affect. His speech is normal and behavior is normal. His mood appears not anxious.  Nursing note and vitals reviewed.    ED Treatments / Results  DIAGNOSTIC STUDIES: Oxygen Saturation is 95% on RA, normal by my interpretation.      Labs (all labs ordered are listed, but only abnormal results are displayed) Results for orders placed or performed during the hospital encounter of 01/29/17  CBC  Result Value Ref Range   WBC 7.7 4.0 - 10.5 K/uL   RBC 4.40 4.22 - 5.81 MIL/uL   Hemoglobin 14.2 13.0 - 17.0 g/dL   HCT 96.0 45.4 - 09.8 %   MCV 90.9 78.0 - 100.0 fL   MCH 32.3 26.0 - 34.0 pg   MCHC 35.5 30.0 - 36.0 g/dL   RDW 11.9 14.7 - 82.9 %   Platelets 195 150 - 400 K/uL  Differential  Result Value Ref Range   Neutrophils Relative % 58 %   Neutro Abs 4.4 1.7 - 7.7 K/uL   Lymphocytes Relative 32 %   Lymphs Abs 2.5 0.7 - 4.0 K/uL   Monocytes Relative  7 %   Monocytes Absolute 0.6 0.1 - 1.0 K/uL   Eosinophils Relative 3 %   Eosinophils Absolute 0.2 0.0 - 0.7 K/uL   Basophils Relative 0 %   Basophils Absolute 0.0 0.0 - 0.1 K/uL  Protime-INR  Result Value Ref Range   Prothrombin Time 11.6 11.4 - 15.2 seconds   INR 0.85   APTT  Result Value Ref Range   aPTT 26 24 - 36 seconds  Comprehensive metabolic panel  Result Value Ref Range   Sodium 138 135 - 145 mmol/L   Potassium 4.0 3.5 - 5.1 mmol/L   Chloride 106 101 - 111 mmol/L   CO2 26 22 - 32 mmol/L   Glucose, Bld 224 (H) 65 - 99 mg/dL   BUN 17 6 - 20 mg/dL   Creatinine, Ser 5.62 0.61 - 1.24 mg/dL   Calcium 8.7 (L) 8.9 - 10.3 mg/dL   Total Protein 6.6 6.5 - 8.1 g/dL   Albumin 3.7 3.5 - 5.0 g/dL   AST 41 15 - 41 U/L   ALT 92 (H) 17 - 63 U/L   Alkaline Phosphatase 89 38 - 126 U/L   Total Bilirubin 0.4 0.3 - 1.2 mg/dL   GFR calc non Af Amer >60 >60 mL/min   GFR calc Af Amer >60 >60 mL/min   Anion gap 6 5 - 15  Troponin I  Result Value Ref Range   Troponin I <0.03 <0.03 ng/mL  Lipid panel  Result Value Ref Range   Cholesterol 213 (H) 0 - 200 mg/dL   Triglycerides 130 (H) <150 mg/dL   HDL 29 (L) >86 mg/dL   Total CHOL/HDL Ratio 7.3 RATIO   VLDL UNABLE TO CALCULATE IF TRIGLYCERIDE OVER 400 mg/dL 0 - 40 mg/dL   LDL Cholesterol UNABLE TO CALCULATE IF TRIGLYCERIDE OVER 400 mg/dL 0 - 99 mg/dL  Troponin I  Result Value Ref Range   Troponin I <0.03 <0.03 ng/mL   Laboratory interpretation all normal except  elevated cholesterol and triglyceride, hyperglycemia    EKG  EKG Interpretation  Date/Time:  Thursday January 29 2017 23:07:58 EST Ventricular Rate:  87 PR Interval:    QRS Duration: 92 QT Interval:  364 QTC Calculation: 438 R Axis:   13 Text Interpretation:  Sinus rhythm Normal ECG No old tracing to compare Confirmed by Denessa Cavan  MD-I, Kelce Bouton ([id number]) on 01/30/2017 3:33:12 AM       Radiology Dg Chest 2 View  Result Date: 01/29/2017 CLINICAL DATA:  Preoperative  examination prior to cardiac catheterization. History of coronary artery disease and 3 previous MIs with stent placement. Current smoker. EXAM: CHEST  2 VIEW COMPARISON:  Chest x-ray dated January 27, 2017 and chest CT scan of the same date. FINDINGS: The lungs are well-expanded and clear. The heart and pulmonary vascularity are normal. The mediastinum is normal in width. There is no pleural effusion. There is multilevel degenerative disc disease of the thoracic spine. There is chronic widening of the AC joints greatest on the left. IMPRESSION: There is no active cardiopulmonary disease. Electronically Signed   By: David  [place] M.D.   On: 01/29/2017 16:42    Procedures Procedures (including critical care time)  CRITICAL CARE Performed by: Tammee Thielke L Hiral Lukasiewicz Total critical care time: 35 minutes Critical care time was exclusive of separately billable procedures and treating other patients. Critical care was necessary to treat or prevent imminent or life-threatening deterioration. Critical care was time spent personally by me on the following activities: development of treatment plan with patient and/or surrogate as well as nursing, discussions with consultants, evaluation of patient's response to treatment, examination of patient, obtaining history from patient or surrogate, ordering and performing treatments and interventions, ordering and review of laboratory studies, ordering and review of radiographic studies, pulse oximetry and re-evaluation of patient's condition.   Medications Ordered in ED Medications  0.9 %  sodium chloride infusion (20 mL/hr Intravenous New Bag/Given 01/29/17 2331)  nitroGLYCERIN (NITROGLYN) 2 % ointment 1 inch (1 inch Topical Given 01/29/17 2331)  aspirin chewable tablet 324 mg (324 mg Oral Given 01/29/17 2332)  nitroGLYCERIN (NITROSTAT) SL tablet 0.4 mg (0.4 mg Sublingual Given 01/30/17 0148)  heparin injection 4,000 Units (4,000 Units Intravenous Given 01/30/17 0332)  heparin  ADULT infusion 100 units/mL (25000 units/273mL sodium chloride 0.45%) (1,000 Units/hr Intravenous New Bag/Given 01/30/17 6045)     Initial Impression / Assessment and Plan / ED Course  I have reviewed the triage vital signs and the nursing notes.  Pertinent labs & imaging results that were available during my care of the patient were reviewed by me and considered in my medical decision making (see chart for details).  COORDINATION OF CARE: 11:17 PM Discussed treatment plan with pt at bedside and pt agreed to plan. I will do a cardiac work-up for the patient. Patient was given aspirin to chew and nitroglycerin paste.  Recheck at 1:30 AM. Delta  troponin was ordered. Patient states his pain was down to a 2 out of 10. He was given a sublingual nitroglycerin which resolved his pain.  03:17 AM Pt discussed with Dr Merrilee Jansky, cardiology, patient will be admitted to [facility] for cardiac cath for accelerated angina. Pt was started on heparin. Pt is agreeable.    Final Clinical Impressions(s) / ED Diagnoses   Final diagnoses:  Angina pectoris (HCC)  Accelerating angina (HCC)    Plan transfer to [facility] for admission   Devoria Albe, MD, FACEP  I personally performed the services described in this documentation, which was scribed in my presence. The recorded information has been reviewed and considered.  Devoria Albe, MD, FACEP    Devoria Albe, MD  01/30/17 0337  

## 2017-01-30 ENCOUNTER — Encounter (HOSPITAL_COMMUNITY): Payer: Self-pay | Admitting: Internal Medicine

## 2017-01-30 ENCOUNTER — Encounter (HOSPITAL_COMMUNITY)
Admission: EM | Disposition: A | Discharge status: Home / Self Care | Payer: Self-pay | Source: Home / Self Care | Attending: Cardiovascular Disease

## 2017-01-30 ENCOUNTER — Inpatient Hospital Stay (HOSPITAL_COMMUNITY): Payer: Medicare Other

## 2017-01-30 DIAGNOSIS — Z7984 Long term (current) use of oral hypoglycemic drugs: Secondary | ICD-10-CM | POA: Diagnosis not present

## 2017-01-30 DIAGNOSIS — I200 Unstable angina: Secondary | ICD-10-CM | POA: Diagnosis not present

## 2017-01-30 DIAGNOSIS — Z7951 Long term (current) use of inhaled steroids: Secondary | ICD-10-CM | POA: Diagnosis not present

## 2017-01-30 DIAGNOSIS — I25110 Atherosclerotic heart disease of native coronary artery with unstable angina pectoris: Secondary | ICD-10-CM | POA: Diagnosis not present

## 2017-01-30 DIAGNOSIS — F329 Major depressive disorder, single episode, unspecified: Secondary | ICD-10-CM | POA: Diagnosis present

## 2017-01-30 DIAGNOSIS — Z8249 Family history of ischemic heart disease and other diseases of the circulatory system: Secondary | ICD-10-CM | POA: Diagnosis not present

## 2017-01-30 DIAGNOSIS — Z720 Tobacco use: Secondary | ICD-10-CM | POA: Diagnosis not present

## 2017-01-30 DIAGNOSIS — I252 Old myocardial infarction: Secondary | ICD-10-CM | POA: Diagnosis not present

## 2017-01-30 DIAGNOSIS — M5134 Other intervertebral disc degeneration, thoracic region: Secondary | ICD-10-CM | POA: Diagnosis present

## 2017-01-30 DIAGNOSIS — R079 Chest pain, unspecified: Secondary | ICD-10-CM | POA: Diagnosis not present

## 2017-01-30 DIAGNOSIS — E785 Hyperlipidemia, unspecified: Secondary | ICD-10-CM | POA: Diagnosis present

## 2017-01-30 DIAGNOSIS — Z7982 Long term (current) use of aspirin: Secondary | ICD-10-CM | POA: Diagnosis not present

## 2017-01-30 DIAGNOSIS — Z79899 Other long term (current) drug therapy: Secondary | ICD-10-CM | POA: Diagnosis not present

## 2017-01-30 DIAGNOSIS — Z955 Presence of coronary angioplasty implant and graft: Secondary | ICD-10-CM | POA: Diagnosis not present

## 2017-01-30 DIAGNOSIS — I10 Essential (primary) hypertension: Secondary | ICD-10-CM | POA: Diagnosis present

## 2017-01-30 DIAGNOSIS — I249 Acute ischemic heart disease, unspecified: Secondary | ICD-10-CM | POA: Diagnosis present

## 2017-01-30 DIAGNOSIS — E1151 Type 2 diabetes mellitus with diabetic peripheral angiopathy without gangrene: Secondary | ICD-10-CM | POA: Diagnosis present

## 2017-01-30 DIAGNOSIS — R0789 Other chest pain: Secondary | ICD-10-CM | POA: Diagnosis not present

## 2017-01-30 DIAGNOSIS — F17210 Nicotine dependence, cigarettes, uncomplicated: Secondary | ICD-10-CM | POA: Diagnosis present

## 2017-01-30 DIAGNOSIS — M797 Fibromyalgia: Secondary | ICD-10-CM | POA: Diagnosis present

## 2017-01-30 DIAGNOSIS — Z880 Allergy status to penicillin: Secondary | ICD-10-CM | POA: Diagnosis not present

## 2017-01-30 HISTORY — PX: LEFT HEART CATH AND CORONARY ANGIOGRAPHY: CATH118249

## 2017-01-30 LAB — LIPID PANEL
CHOLESTEROL: 213 mg/dL — AB (ref 0–200)
HDL: 29 mg/dL — ABNORMAL LOW (ref >40)
LDL Cholesterol: UNDETERMINED mg/dL (ref 0–99)
Total CHOL/HDL Ratio: 7.3 RATIO
Triglycerides: 806 mg/dL — ABNORMAL HIGH (ref <150)
VLDL: UNDETERMINED mg/dL (ref 0–40)

## 2017-01-30 LAB — CBC
HCT: 37.9 % — ABNORMAL LOW (ref 39.0–52.0)
Hemoglobin: 12.7 g/dL — ABNORMAL LOW (ref 13.0–17.0)
MCH: 30.6 pg (ref 26.0–34.0)
MCHC: 33.5 g/dL (ref 30.0–36.0)
MCV: 91.3 fL (ref 78.0–100.0)
Platelets: 159 K/uL (ref 150–400)
RBC: 4.15 MIL/uL — ABNORMAL LOW (ref 4.22–5.81)
RDW: 12.9 % (ref 11.5–15.5)
WBC: 5.8 K/uL (ref 4.0–10.5)

## 2017-01-30 LAB — HEPARIN LEVEL (UNFRACTIONATED): Heparin Unfractionated: 0.21 IU/mL — ABNORMAL LOW (ref 0.30–0.70)

## 2017-01-30 LAB — TSH: TSH: 0.979 u[IU]/mL (ref 0.350–4.500)

## 2017-01-30 LAB — HIV ANTIBODY (ROUTINE TESTING W REFLEX): HIV Screen 4th Generation wRfx: NONREACTIVE

## 2017-01-30 LAB — TROPONIN I

## 2017-01-30 SURGERY — LEFT HEART CATH AND CORONARY ANGIOGRAPHY
Anesthesia: LOCAL

## 2017-01-30 MED ORDER — HEPARIN (PORCINE) IN NACL 2-0.9 UNIT/ML-% IJ SOLN
INTRAMUSCULAR | Status: DC | PRN
  Administered 2017-01-30: 1000 mL

## 2017-01-30 MED ORDER — METOPROLOL TARTRATE 12.5 MG HALF TABLET: 12.5000 mg | ORAL_TABLET | Freq: Two times a day (BID) | ORAL | Status: DC

## 2017-01-30 MED ORDER — VERAPAMIL HCL 2.5 MG/ML IV SOLN
INTRAVENOUS | Status: DC | PRN
  Administered 2017-01-30: 10 mL via INTRA_ARTERIAL

## 2017-01-30 MED ORDER — LISINOPRIL 2.5 MG PO TABS
2.5000 mg | ORAL_TABLET | Freq: Every day | ORAL | Status: DC
  Administered 2017-01-30 – 2017-01-31 (×2): 2.5 mg via ORAL
  Filled 2017-01-30 (×2): qty 1

## 2017-01-30 MED ORDER — OXYCODONE HCL 5 MG PO TABS
30.0000 mg | ORAL_TABLET | ORAL | Status: DC | PRN
  Administered 2017-01-30 – 2017-01-31 (×8): 30 mg via ORAL
  Filled 2017-01-30 (×8): qty 6

## 2017-01-30 MED ORDER — ISOSORBIDE MONONITRATE ER 30 MG PO TB24
30.0000 mg | ORAL_TABLET | Freq: Every day | ORAL | Status: DC
  Administered 2017-01-31: 30 mg via ORAL
  Filled 2017-01-30: qty 1

## 2017-01-30 MED ORDER — NICOTINE 14 MG/24HR TD PT24
14.0000 mg | MEDICATED_PATCH | Freq: Every day | TRANSDERMAL | Status: DC
  Administered 2017-01-30 – 2017-01-31 (×2): 14 mg via TRANSDERMAL
  Filled 2017-01-30 (×2): qty 1

## 2017-01-30 MED ORDER — PSEUDOEPHEDRINE HCL ER 120 MG PO TB12
120.0000 mg | ORAL_TABLET | Freq: Every day | ORAL | Status: DC | PRN
  Administered 2017-01-30: 120 mg via ORAL
  Filled 2017-01-30 (×3): qty 1

## 2017-01-30 MED ORDER — SODIUM CHLORIDE 0.9 % IV SOLN: 250.0000 mL | INTRAVENOUS | Status: DC | PRN

## 2017-01-30 MED ORDER — NITROGLYCERIN 0.4 MG SL SUBL
0.4000 mg | SUBLINGUAL_TABLET | Freq: Once | SUBLINGUAL | Status: AC
  Administered 2017-01-30: 0.4 mg via SUBLINGUAL
  Filled 2017-01-30: qty 1

## 2017-01-30 MED ORDER — LORATADINE 10 MG PO TABS
10.0000 mg | ORAL_TABLET | Freq: Every day | ORAL | Status: DC | PRN
  Administered 2017-01-30: 10 mg via ORAL
  Filled 2017-01-30: qty 1

## 2017-01-30 MED ORDER — SODIUM CHLORIDE 0.9% FLUSH: 3.0000 mL | Freq: Two times a day (BID) | INTRAVENOUS | Status: DC

## 2017-01-30 MED ORDER — HEPARIN SODIUM (PORCINE) 1000 UNIT/ML IJ SOLN
INTRAMUSCULAR | Status: DC | PRN
  Administered 2017-01-30: 4500 [IU] via INTRAVENOUS

## 2017-01-30 MED ORDER — SODIUM CHLORIDE 0.9% FLUSH: 3.0000 mL | INTRAVENOUS | Status: DC | PRN

## 2017-01-30 MED ORDER — ATORVASTATIN CALCIUM 80 MG PO TABS
80.0000 mg | ORAL_TABLET | Freq: Every day | ORAL | Status: DC
  Administered 2017-01-30: 80 mg via ORAL
  Filled 2017-01-30 (×2): qty 1

## 2017-01-30 MED ORDER — DULOXETINE HCL 60 MG PO CPEP
60.0000 mg | ORAL_CAPSULE | Freq: Every day | ORAL | Status: DC
  Administered 2017-01-30 – 2017-01-31 (×2): 60 mg via ORAL
  Filled 2017-01-30 (×2): qty 1

## 2017-01-30 MED ORDER — LORATADINE-PSEUDOEPHEDRINE ER 10-240 MG PO TB24: 1.0000 | ORAL_TABLET | Freq: Every day | ORAL | Status: DC | PRN

## 2017-01-30 MED ORDER — SODIUM CHLORIDE 0.9 % IV SOLN: INTRAVENOUS | Status: DC

## 2017-01-30 MED ORDER — HEPARIN BOLUS VIA INFUSION
1500.0000 [IU] | Freq: Once | INTRAVENOUS | Status: AC
  Administered 2017-01-30: 1500 [IU] via INTRAVENOUS
  Filled 2017-01-30: qty 1500

## 2017-01-30 MED ORDER — HEPARIN (PORCINE) IN NACL 100-0.45 UNIT/ML-% IJ SOLN: 1250.0000 [IU]/h | INTRAMUSCULAR | Status: DC

## 2017-01-30 MED ORDER — MIDAZOLAM HCL 2 MG/2ML IJ SOLN
INTRAMUSCULAR | Status: DC | PRN
  Administered 2017-01-30: 2 mg via INTRAVENOUS

## 2017-01-30 MED ORDER — ONDANSETRON HCL 4 MG/2ML IJ SOLN: 4.0000 mg | Freq: Four times a day (QID) | INTRAMUSCULAR | Status: DC | PRN

## 2017-01-30 MED ORDER — HEPARIN SODIUM (PORCINE) 5000 UNIT/ML IJ SOLN
4000.0000 [IU] | Freq: Once | INTRAMUSCULAR | Status: AC
  Administered 2017-01-30: 4000 [IU] via INTRAVENOUS

## 2017-01-30 MED ORDER — SODIUM CHLORIDE 0.9 % IV SOLN
INTRAVENOUS | Status: AC
  Administered 2017-01-30: 16:00:00 via INTRAVENOUS

## 2017-01-30 MED ORDER — FENTANYL CITRATE (PF) 100 MCG/2ML IJ SOLN
INTRAMUSCULAR | Status: DC | PRN
  Administered 2017-01-30: 50 ug via INTRAVENOUS

## 2017-01-30 MED ORDER — FENTANYL CITRATE (PF) 100 MCG/2ML IJ SOLN
INTRAMUSCULAR | Status: AC
  Filled 2017-01-30: qty 2

## 2017-01-30 MED ORDER — LIDOCAINE HCL (PF) 1 % IJ SOLN
INTRAMUSCULAR | Status: AC
  Filled 2017-01-30: qty 30

## 2017-01-30 MED ORDER — NITROGLYCERIN 0.4 MG SL SUBL
0.4000 mg | SUBLINGUAL_TABLET | SUBLINGUAL | Status: DC | PRN
Start: 2017-01-30 — End: 2017-01-31

## 2017-01-30 MED ORDER — GLIPIZIDE 10 MG PO TABS
10.0000 mg | ORAL_TABLET | Freq: Every day | ORAL | Status: DC
  Administered 2017-01-31: 10 mg via ORAL
  Filled 2017-01-30: qty 1

## 2017-01-30 MED ORDER — IOPAMIDOL (ISOVUE-370) INJECTION 76%
INTRAVENOUS | Status: DC | PRN
  Administered 2017-01-30: 75 mL via INTRA_ARTERIAL

## 2017-01-30 MED ORDER — MIDAZOLAM HCL 2 MG/2ML IJ SOLN
INTRAMUSCULAR | Status: AC
  Filled 2017-01-30: qty 2

## 2017-01-30 MED ORDER — CARVEDILOL 3.125 MG PO TABS
3.1250 mg | ORAL_TABLET | Freq: Two times a day (BID) | ORAL | Status: DC
Start: 2017-01-30 — End: 2017-01-31
  Administered 2017-01-30 – 2017-01-31 (×3): 3.125 mg via ORAL
  Filled 2017-01-30 (×3): qty 1

## 2017-01-30 MED ORDER — HEPARIN (PORCINE) IN NACL 100-0.45 UNIT/ML-% IJ SOLN
1000.0000 [IU]/h | Freq: Once | INTRAMUSCULAR | Status: AC
  Administered 2017-01-30: 1000 [IU]/h via INTRAVENOUS
  Filled 2017-01-30: qty 250

## 2017-01-30 MED ORDER — ASPIRIN 81 MG PO CHEW: 81.0000 mg | CHEWABLE_TABLET | ORAL | Status: DC

## 2017-01-30 MED ORDER — ACETAMINOPHEN 325 MG PO TABS
650.0000 mg | ORAL_TABLET | ORAL | Status: DC | PRN
  Administered 2017-01-30 – 2017-01-31 (×4): 650 mg via ORAL
  Filled 2017-01-30 (×4): qty 2

## 2017-01-30 MED ORDER — LIDOCAINE HCL (PF) 1 % IJ SOLN
INTRAMUSCULAR | Status: DC | PRN
  Administered 2017-01-30: 2 mL

## 2017-01-30 MED ORDER — ASPIRIN 81 MG PO CHEW
81.0000 mg | CHEWABLE_TABLET | Freq: Every day | ORAL | Status: DC
  Administered 2017-01-30 – 2017-01-31 (×2): 81 mg via ORAL
  Filled 2017-01-30 (×2): qty 1

## 2017-01-30 MED ORDER — HEPARIN (PORCINE) IN NACL 2-0.9 UNIT/ML-% IJ SOLN
INTRAMUSCULAR | Status: AC
  Filled 2017-01-30: qty 1000

## 2017-01-30 MED ORDER — SODIUM CHLORIDE 0.9% FLUSH
3.0000 mL | Freq: Two times a day (BID) | INTRAVENOUS | Status: DC
  Administered 2017-01-30 – 2017-01-31 (×2): 3 mL via INTRAVENOUS

## 2017-01-30 MED ORDER — ISOSORBIDE MONONITRATE ER 30 MG PO TB24
15.0000 mg | ORAL_TABLET | Freq: Once | ORAL | Status: AC
  Administered 2017-01-30: 15 mg via ORAL
  Filled 2017-01-30: qty 1

## 2017-01-30 MED ORDER — HEPARIN SODIUM (PORCINE) 1000 UNIT/ML IJ SOLN
INTRAMUSCULAR | Status: AC
  Filled 2017-01-30: qty 1

## 2017-01-30 MED ORDER — PANTOPRAZOLE SODIUM 40 MG PO TBEC
40.0000 mg | DELAYED_RELEASE_TABLET | Freq: Every day | ORAL | Status: DC
Start: 2017-01-30 — End: 2017-01-31
  Administered 2017-01-30 – 2017-01-31 (×2): 40 mg via ORAL
  Filled 2017-01-30 (×2): qty 1

## 2017-01-30 MED ORDER — NICOTINE POLACRILEX 2 MG MT GUM
2.0000 mg | CHEWING_GUM | OROMUCOSAL | Status: DC | PRN
  Administered 2017-01-31: 2 mg via ORAL
  Filled 2017-01-30 (×2): qty 1

## 2017-01-30 MED ORDER — FLUTICASONE PROPIONATE 50 MCG/ACT NA SUSP
2.0000 | Freq: Every day | NASAL | Status: DC
  Administered 2017-01-30 – 2017-01-31 (×2): 2 via NASAL
  Filled 2017-01-30: qty 16

## 2017-01-30 MED ORDER — PRAVASTATIN SODIUM 40 MG PO TABS: 40.0000 mg | ORAL_TABLET | Freq: Every evening | ORAL | Status: DC

## 2017-01-30 MED ORDER — TRAMADOL HCL 50 MG PO TABS
100.0000 mg | ORAL_TABLET | Freq: Four times a day (QID) | ORAL | Status: DC | PRN
  Administered 2017-01-30 – 2017-01-31 (×2): 100 mg via ORAL
  Filled 2017-01-30 (×2): qty 2

## 2017-01-30 MED ORDER — IOPAMIDOL (ISOVUE-370) INJECTION 76%
INTRAVENOUS | Status: AC
  Filled 2017-01-30: qty 100

## 2017-01-30 SURGICAL SUPPLY — 10 items

## 2017-01-30 NOTE — Interval H&P Note (Signed)
History and Physical Interval Note:  01/30/2017 3:01 PM  Dorthula NettlesBryan L Andrzejewski Sr.  has presented today for cardiac catheterization, with the diagnosis of unstable angina. The various methods of treatment have been discussed with the patient and family. After consideration of risks, benefits and other options for treatment, the patient has consented to  Procedure(s): Left Heart Cath and Coronary Angiography (N/A) as a surgical intervention .  The patient's history has been reviewed, patient examined, no change in status, stable for surgery.  I have reviewed the patient's chart and labs.  Questions were answered to the patient's satisfaction.    Cath Lab Visit (complete for each Cath Lab visit)  Clinical Evaluation Leading to the Procedure:   ACS: Yes.    Non-ACS:    Anginal Classification: CCS IV  Anti-ischemic medical therapy: Minimal Therapy (1 class of medications)  Non-Invasive Test Results: No non-invasive testing performed  Prior CABG: No previous CABG  Kong Packett

## 2017-01-30 NOTE — Progress Notes (Signed)
Wife at bedside. Patient just returned from hallway walking. States his chest is hurting but from trying to catch his breath. Will continue to monitor, call light within reach.

## 2017-01-30 NOTE — Progress Notes (Signed)
ANTICOAGULATION CONSULT NOTE  Pharmacy Consult for heparin Indication: chest pain/ACS  Allergies  Allergen Reactions  . Ampicillin     REACTION: Headache, diarrhea    Patient Measurements: Height: 5\' 10"  (177.8 cm) Weight: 199 lb 11.2 oz (90.6 kg) IBW/kg (Calculated) : 73 Heparin Dosing Weight: 90.6  Vital Signs: Temp: 98.3 F (36.8 C) (02/23 0533) Temp Source: Oral (02/23 0533) BP: 127/76 (02/23 0533) Pulse Rate: 75 (02/23 0533)  Labs:  Recent Labs  01/29/17 1606 01/29/17 2321 01/30/17 0113 01/30/17 0945  HGB 14.6 14.2  --  12.7*  HCT 42.1 40.0  --  37.9*  PLT 202 195  --  159  APTT  --  26  --   --   LABPROT 12.2 11.6  --   --   INR 0.91 0.85  --   --   HEPARINUNFRC  --   --   --  0.21*  CREATININE 0.78 0.81  --   --   TROPONINI  --  <0.03 <0.03  --     Estimated Creatinine Clearance: 113.9 mL/min (by C-G formula based on SCr of 0.81 mg/dL).   Medical History: Past Medical History:  Diagnosis Date  . Chest pain   . Coronary atherosclerosis of native coronary artery 2009   stent  . Depression   . Dyslipidemia   . Fibromyalgia   . HTN (hypertension)   . NSTEMI (non-ST elevated myocardial infarction) (HCC)     Medications:  Prescriptions Prior to Admission  Medication Sig Dispense Refill Last Dose  . acetaminophen (TYLENOL) 500 MG tablet Take 500 mg by mouth as needed.   Taking  . aspirin 81 MG tablet Take 81 mg by mouth daily.   Taking  . diclofenac sodium (VOLTAREN) 1 % GEL Apply 1 application topically as needed.   Taking  . DULoxetine (CYMBALTA) 60 MG capsule Take 60 mg by mouth daily.   Taking  . fluticasone (FLONASE) 50 MCG/ACT nasal spray Place 2 sprays into both nostrils daily.   Taking  . glipiZIDE (GLUCOTROL) 10 MG tablet Take 10 mg by mouth daily before breakfast.   Taking  . lisinopril (PRINIVIL,ZESTRIL) 2.5 MG tablet Take 1 tablet by mouth daily.   Taking  . loratadine-pseudoephedrine (CLARITIN-D 24-HOUR) 10-240 MG per 24 hr tablet Take  1 tablet by mouth daily as needed.    Taking  . metFORMIN (GLUCOPHAGE-XR) 500 MG 24 hr tablet Take 1,000 mg by mouth daily.  3 Taking  . metoprolol succinate (TOPROL-XL) 25 MG 24 hr tablet Take 25 mg by mouth daily.   Taking  . Multiple Minerals (CALCIUM/MAGNESIUM/ZINC PO) Take 2 tablets by mouth daily.   Taking  . nitroGLYCERIN (NITROSTAT) 0.4 MG SL tablet Place 0.4 mg under the tongue every 5 (five) minutes as needed.   Taking  . omeprazole (PRILOSEC) 20 MG capsule Take 20 mg by mouth 2 (two) times daily before a meal.    Taking  . oxycodone (ROXICODONE) 30 MG immediate release tablet Take 30 mg by mouth every 4 (four) hours as needed for pain.   Taking  . pravastatin (PRAVACHOL) 40 MG tablet Take 1 tablet (40 mg total) by mouth every evening. 90 tablet 3     Assessment: 56 [facility].  Pt given heparin 4000 unit bolus and heparin drip at 1000 units/hr at 0332 per orders from Dr Lynelle DoctorKnapp.   Wt 90.6 kg, trop neg x 2,  INR 0.85. Creat WNL.  Pt on cath lab  schedule for 11 am today.  First heparin level is below goal at 0.21 drawn ~ 6 hrs after bolus. Hgb 14.2>12.7, pltc 159.  No bleeding reported.   Goal of Therapy:  Heparin level 0.3-0.7 units/ml Monitor platelets by anticoagulation protocol: Yes   Plan:  Bolus 1500 units IV x 1 Increase heparin drip to 1250 units/hr F/u after cath  - if he does not go he will need an 1800 heparin level  Herby Abraham, Pharm.D. [phone] 01/30/2017 11:29 AM

## 2017-01-30 NOTE — Research (Signed)
OPTIMIZE Informed Consent   Subject Name: Howard BOUWENS Sr.  Subject met inclusion and exclusion criteria.  The informed consent form, study requirements and expectations were reviewed with the subject and questions and concerns were addressed prior to the signing of the consent form.  The subject verbalized understanding of the trail requirements.  The subject agreed to participate in the OPTIMIZE trial and signed the informed consent.  The informed consent was obtained prior to performance of any protocol-specific procedures for the subject.  A copy of the signed informed consent was given to the subject and a copy was placed in the subject's medical record.  Hedrick,Joshaua Epple W 01/30/2017, 10:00 AM

## 2017-01-30 NOTE — Progress Notes (Signed)
[facility] and oriented to room. Skin assessment and tele monitoring completed. Cardiology has already seen the patient and he is aware that he is NPO for a cath later on today. Call light within reach.

## 2017-01-30 NOTE — H&P (Signed)
CARDIOLOGY H&P  CC: Accelerating Angina  HPI: Howard Nelson is a 56 year old male with a history of CAD status post a BMS to the mid- right coronary artery in December 2007 and DES to the proximal-right coronary artery in November 2014, diabetes, hypertension, hyperlipidemia, active tobacco abuse, and fibromyalgia who presents with accelerating angina.  Following his last stent in December 2014, Howard Nelson did not have chest pain. In late January 2018, however, he noted the return of left-sided chest pain with numbness in his left hand (anginal equivalent) and accompanying fatigue, shortness of breath, and dyspnea on exertion. His chest pain was brought on by exertion and resolved with one hour of rest or 1-2 NTG, with episodes occurring 1-3 times per day.   He was seen in Cardiology Clinic yesterday and was scheduled for left heart catheterization on 02/03/2017. Following his clinic visit, while walking into his house, he began having 8/10 chest pain. As instructed in clinic, he took 1 NTG, called 911, and was transported to the emergency department where his pain on arrival was 4/10. His chest pain resolved with ASA, NTG paste, and a second NTG.   He prefers not to wait until 02/03/2017 as he is currently unable to walk more than 50 feet without limiting chest pain.  He is smoking 1.5 packs per day.  He is a history of abnormal liver function tests in the past and as such been maintained on pravastatin. Interestingly, his triglycerides are 806.  Review of Systems:     Cardiac Review of Systems: {Y] = yes [ ]  = no  Chest Pain [X]   Resting SOB [   ] Exertional SOB  [X]   Orthopnea [  ]   Pedal Edema [   ]    Palpitations [  ] Syncope  [  ]   Presyncope [   ]  General Review of Systems: [Y] = yes [  ]=no Constitional: recent weight change [  ]; anorexia [  ]; fatigue [X] ; nausea [  ]; night sweats [  ]; fever [  ]; or chills [  ];                                                                      Dental: poor dentition[  ];   Eye : blurred vision [  ]; diplopia [   ]; vision changes [  ];  Amaurosis fugax[  ]; Resp: cough [  ];  wheezing[  ];  hemoptysis[  ]; shortness of breath[  ]; paroxysmal nocturnal dyspnea[  ]; dyspnea on exertion[X] ; or orthopnea[  ];  GI:  gallstones[  ], vomiting[  ];  dysphagia[  ]; melena[  ];  hematochezia [  ]; heartburn[  ];   GU: kidney stones [  ]; hematuria[  ];   dysuria [  ];  nocturia[  ];               Skin: rash [  ], swelling[  ];, hair loss[  ];  peripheral edema[  ];  or itching[  ]; Musculosketetal: myalgias[  ];  joint swelling[  ];  joint erythema[  ];  joint pain[  ];  back pain[  ];  Heme/Lymph: bruising[  ];  bleeding[  ];  anemia[  ];  Neuro: TIA[  ];  headaches[  ];  stroke[  ];  vertigo[  ];  seizures[  ];   paresthesias[  ];  difficulty walking[  ];  Psych:depression[  ]; anxiety[  ];  Endocrine: diabetes[  ];  thyroid dysfunction[  ];  Other:  Past Medical History:  Diagnosis Date  . Chest pain   . Coronary atherosclerosis of native coronary artery 2009   stent  . Depression   . Dyslipidemia   . Fibromyalgia   . HTN (hypertension)   . NSTEMI (non-ST elevated myocardial infarction) [facility])     Prior to Admission medications   Medication Sig Start Date End Date Taking? Authorizing Provider  acetaminophen (TYLENOL) 500 MG tablet Take 500 mg by mouth as needed.   Yes Historical Provider, MD  aspirin 81 MG tablet Take 81 mg by mouth daily.   Yes Historical Provider, MD  diclofenac sodium (VOLTAREN) 1 % GEL Apply 1 application topically as needed.   Yes Historical Provider, MD  DULoxetine (CYMBALTA) 60 MG capsule Take 60 mg by mouth daily.   Yes Historical Provider, MD  fluticasone (FLONASE) 50 MCG/ACT nasal spray Place 2 sprays into both nostrils daily. 10/20/15  Yes Historical Provider, MD  glipiZIDE (GLUCOTROL) 10 MG tablet Take 10 mg by mouth daily before breakfast.   Yes Historical Provider, MD  lisinopril (PRINIVIL,ZESTRIL)  2.5 MG tablet Take 1 tablet by mouth daily. 10/19/15  Yes Historical Provider, MD  loratadine-pseudoephedrine (CLARITIN-D 24-HOUR) 10-240 MG per 24 hr tablet Take 1 tablet by mouth daily as needed.    Yes Historical Provider, MD  metFORMIN (GLUCOPHAGE-XR) 500 MG 24 hr tablet Take 1,000 mg by mouth daily. 10/04/15  Yes Historical Provider, MD  metoprolol succinate (TOPROL-XL) 25 MG 24 hr tablet Take 25 mg by mouth daily.   Yes Historical Provider, MD  Multiple Minerals (CALCIUM/MAGNESIUM/ZINC PO) Take 2 tablets by mouth daily.   Yes Historical Provider, MD  nitroGLYCERIN (NITROSTAT) 0.4 MG SL tablet Place 0.4 mg under the tongue every 5 (five) minutes as needed.   Yes Historical Provider, MD  omeprazole (PRILOSEC) 20 MG capsule Take 20 mg by mouth 2 (two) times daily before a meal.    Yes Historical Provider, MD  oxycodone (ROXICODONE) 30 MG immediate release tablet Take 30 mg by mouth every 4 (four) hours as needed for pain.   Yes Historical Provider, MD  pravastatin (PRAVACHOL) 40 MG tablet Take 1 tablet (40 mg total) by mouth every evening. 01/29/17  Yes Jodelle Gross, NP     Allergies  Allergen Reactions  . Ampicillin     REACTION: Headache, diarrhea    Social History   Social History  . Marital status: Married    Spouse name: N/A  . Number of children: N/A  . Years of education: N/A   Occupational History  . Not on file.   Social History Main Topics  . Smoking status: Current Every Day Smoker    Packs/day: 1.00    Types: Cigarettes    Start date: 12/01/1972  . Smokeless tobacco: Never Used  . Alcohol use Not on file  . Drug use: No  . Sexual activity: Not on file   Other Topics Concern  . Not on file   Social History Narrative  . No narrative on file    Family History  Problem Relation Age of Onset  . Coronary artery disease Mother   . Coronary artery disease Father  PHYSICAL EXAM: Vitals:   01/30/17 0330 01/30/17 0400  BP: 135/81 139/92  Pulse: 73  89  Resp: 17 16  Temp:     General:  Well appearing. No respiratory difficulty HEENT: Normal Neck: supple. no JVD. Carotids 2+ bilat; no bruits. No lymphadenopathy or thryomegaly appreciated. Cor: NTG patch on chest. PMI nondisplaced. Regular rate & rhythm. No rubs, gallops or murmurs. Lungs: clear Abdomen: soft, nontender, nondistended. No hepatosplenomegaly. No bruits or masses. Good bowel sounds. Extremities: no cyanosis, clubbing, rash, edema Neuro: alert & oriented x 3, cranial nerves grossly intact. moves all 4 extremities w/o difficulty. Affect pleasant.  ECG: NSR, normal EKG, no ST-T changes or STE  Results for orders placed or performed during the hospital encounter of 01/29/17 (from the past 24 hour(s))  CBC     Status: None   Collection Time: 01/29/17 11:21 PM  Result Value Ref Range   WBC 7.7 4.0 - 10.5 K/uL   RBC 4.40 4.22 - 5.81 MIL/uL   Hemoglobin 14.2 13.0 - 17.0 g/dL   HCT 16.140.0 09.639.0 - 04.552.0 %   MCV 90.9 78.0 - 100.0 fL   MCH 32.3 26.0 - 34.0 pg   MCHC 35.5 30.0 - 36.0 g/dL   RDW 40.912.7 81.111.5 - 91.415.5 %   Platelets 195 150 - 400 K/uL  Differential     Status: None   Collection Time: 01/29/17 11:21 PM  Result Value Ref Range   Neutrophils Relative % 58 %   Neutro Abs 4.4 1.7 - 7.7 K/uL   Lymphocytes Relative 32 %   Lymphs Abs 2.5 0.7 - 4.0 K/uL   Monocytes Relative 7 %   Monocytes Absolute 0.6 0.1 - 1.0 K/uL   Eosinophils Relative 3 %   Eosinophils Absolute 0.2 0.0 - 0.7 K/uL   Basophils Relative 0 %   Basophils Absolute 0.0 0.0 - 0.1 K/uL  Protime-INR     Status: None   Collection Time: 01/29/17 11:21 PM  Result Value Ref Range   Prothrombin Time 11.6 11.4 - 15.2 seconds   INR 0.85   APTT     Status: None   Collection Time: 01/29/17 11:21 PM  Result Value Ref Range   aPTT 26 24 - 36 seconds  Comprehensive metabolic panel     Status: Abnormal   Collection Time: 01/29/17 11:21 PM  Result Value Ref Range   Sodium 138 135 - 145 mmol/L   Potassium 4.0 3.5 -  5.1 mmol/L   Chloride 106 101 - 111 mmol/L   CO2 26 22 - 32 mmol/L   Glucose, Bld 224 (H) 65 - 99 mg/dL   BUN 17 6 - 20 mg/dL   Creatinine, Ser 7.820.81 0.61 - 1.24 mg/dL   Calcium 8.7 (L) 8.9 - 10.3 mg/dL   Total Protein 6.6 6.5 - 8.1 g/dL   Albumin 3.7 3.5 - 5.0 g/dL   AST 41 15 - 41 U/L   ALT 92 (H) 17 - 63 U/L   Alkaline Phosphatase 89 38 - 126 U/L   Total Bilirubin 0.4 0.3 - 1.2 mg/dL   GFR calc non Af Amer >60 >60 mL/min   GFR calc Af Amer >60 >60 mL/min   Anion gap 6 5 - 15  Troponin I     Status: None   Collection Time: 01/29/17 11:21 PM  Result Value Ref Range   Troponin I <0.03 <0.03 ng/mL  Lipid panel     Status: Abnormal   Collection Time: 01/29/17 11:22 PM  Result  Value Ref Range   Cholesterol 213 (H) 0 - 200 mg/dL   Triglycerides 161 (H) <150 mg/dL   HDL 29 (L) >09 mg/dL   Total CHOL/HDL Ratio 7.3 RATIO   VLDL UNABLE TO CALCULATE IF TRIGLYCERIDE OVER 400 mg/dL 0 - 40 mg/dL   LDL Cholesterol UNABLE TO CALCULATE IF TRIGLYCERIDE OVER 400 mg/dL 0 - 99 mg/dL  Troponin I     Status: None   Collection Time: 01/30/17  1:13 AM  Result Value Ref Range   Troponin I <0.03 <0.03 ng/mL   Dg Chest 2 View  Result Date: 01/29/2017 CLINICAL DATA:  Preoperative examination prior to cardiac catheterization. History of coronary artery disease and 3 previous MIs with stent placement. Current smoker. EXAM: CHEST  2 VIEW COMPARISON:  Chest x-ray dated January 27, 2017 and chest CT scan of the same date. FINDINGS: The lungs are well-expanded and clear. The heart and pulmonary vascularity are normal. The mediastinum is normal in width. There is no pleural effusion. There is multilevel degenerative disc disease of the thoracic spine. There is chronic widening of the AC joints greatest on the left. IMPRESSION: There is no active cardiopulmonary disease. Electronically Signed   By: David  [place] M.D.   On: 01/29/2017 16:42   11/2006 Cath FINDINGS:  Aortic pressure 112/71 with mean of 89.  Left  ventricular  pressures 107/4 with an end-diastolic pressure of 16.    CORONARY ANGIOGRAPHY:  The left main stem is angiographically normal and  bifurcates into the LAD and left circumflex.    The LAD is a large-caliber vessel that courses down the left ventricular  apex.  It has no significant angiographic disease throughout it's  course.  There are two diagonal branches, the first of which is a large  caliber.  There is a 50% ostial stenosis of the first diagonal.  The  second diagonal is medium caliber and is angiographically normal.    The left circumflex system is medium caliber.  It gives off two marginal  branches.  The first marginal is medium caliber and has a 50% ostial  stenosis.  There is normal flow throughout that vessel.  Second obtuse  marginal is angiographically normal.  The true AV groove circumflex is  small caliber.    The right coronary artery is a large dominant vessel.  It is tortuous in  its proximal portion.  It gives off a large PDA branch distally.  It  gives off a posterior AV segment that has four posterolateral branches.  The mid right coronary artery has a severe focal stenosis of the least  95%.  There is an RV marginal branch that arises from this region of  high-grade stenosis.  There is TIMI III flow throughout the vessel.    Left ventriculogram demonstrates low normal left ventricular function  with an LVEF of 55%.    ASSESSMENT:  1. Severe single-vessel coronary artery disease with high-grade focal      stenosis of the mid-right coronary artery.  2. Normal left ventricular function.    PLAN:  As described above, PCI of the mid-right coronary artery was  performed with a Liberte bare metal stent.  This was post-dilated with a  4-0 noncompliant balloon, with an excellent angiographic result.  The RV  marginal branch did have compromised flow following the procedure.  The  RV marginal branch is approximately a 2.0-mm vessel, and I elected not   to mechanically intervene on this vessel.  Will place Howard Nelson on a  nitroglycerin drip and will treat him medically.  I do not think he will  have long-term sequelae from his compromised RV marginal branch, and his  right coronary artery is a very large vessel that I did not want to risk  with treating the side branch.  He has been pretreated with clopidogrel.  He should receive dual anti-platelet therapy with aspirin and  clopidogrel.  He needs indefinite aspirin and clopidogrel for a minimum  of 30 days.  He would benefit from 1 year of clopidogrel, based on the  cure data for non-stemi.  11/02/2013 LHC: Equipment used: 1. 6 [place] JR 4 guide without sideholes 2. Short BMW wire 3. 2.5 x 12 mm trek balloon 4. 3.5 x 38 mm Promus premiere drug-eluting stent 5. 3.75 x 12 mm [place] trek balloon  Medications used: 1. Angiomax IV 2. Brilinta 180 mg p.o. 3. Intracoronary nitroglycerin  Description of procedure:  After diagnostic angiograms were performed a JR 4 guide was brought up into the ostium of the right coronary artery provided adequate backup support. The vessel was wired with a short BMW wire. A 2.5 x 12 mm trek balloon was brought down and used to  predilate the lesion for a total of 40 seconds the maximum of 12 atmospheres with 2 inflations.  Next a 3.5 x 38 mm Promus premiere drug-eluting stent was deployed in the proximal the mid right coronary artery at 16 atmospheres for 30 seconds. Subsequently the stent was postdilated with a 3.75 x 12 mm  trek balloon with 3 inflations a maximum of  18 atmospheres for a total of 60 seconds.  Finally the balloon and wire were removed. Final angiogram show no evidence of stent malposition, vessel perforation, or dissection. There is TIMI 3 flow down the vessel. The patient was chest pain-free.  A TR band was placed in the right wrist for hemostasis.  Conclusions 1. Successful PCI to the proximal right coronary artery as above with a  drug-eluting stent. Lesion length was 34 mm eccentric mild calcium no thrombus. Lesion went from 80% pre-to 0% post residual. Pre-TIMI flow is 3, post TIMI flow is 3. 2. Right radial access.  Result Impression  Impression: Obstructive disease noted in the proximal RCA. Interventional cardiology has been consult for intervention percutaneously  Result Narrative  Primary indication:56 year old man with known coronary artery disease transferred from [facility] with acute coronary syndrome  Procedures:Left heart cath, coronary angiography  Procedure details: The patient was brought to the cardiac catheterization lab at [facility] and stable condition. Informed consent was obtained. Allen's test was normal. The right radial artery was draped in a sterile fashion. Local  anesthetic was used with lidocaine. We successfully placed a 6 French slender sheath in the right radial artery without complication. Verapamil was used to prevent radial artery spasm.  A 5 French tiger catheter was advanced over a long exchange J.  tipped wire to the proximal ascending aorta. Heparin was then administered prophylactically. We then manipulated the 5 [place] Tiger catheter to engage the right and left coronary arteries. Selective angiography was performed in multiple views. We  reinserted the long exchange J. tipped wire. We exchanged catheters for a pigtail catheter. Using the support of the wire we advanced the catheter across the aortic valve to the left ventricular cavity. Pressures were measured, A LV gram was obtained. A  pull back was then performed. All catheters were removed over a wire. An additional bolus of heparin was given prophylactically.  A TR Band was used for hemostasis  Diagnostic findings:  Left coronary artery: Left main 20% ostial LAD large vessel that wraps the apex. There are 2 moderate sized diagonal vessels. The LAD system with 0-20% stenosis LCx gives off a large OM1 that  is bifurcating. Zero 20% stenosis  Right coronary artery: 90% proximal stenosis. Mid stent widely patent distal vessel with 20% stenosis  Left ventricular pressure 166/18, aortic pressure 160/87 Left ventriculography reveals normal chamber size, estimated ejection fraction 55%  Adverse events:  The patient tolerated the procedure well with no complications noted   ASSESSMENT: 56 year old male with a history of CAD status post a BMS to the mid- right coronary artery in December 2007 and DES to the proximal-right coronary artery in November 2014, diabetes, hypertension, hyperlipidemia, active tobacco abuse, and fibromyalgia who presents with accelerating angina. His elevated TG are very elevated.   PLAN/DISCUSSION: #) Cv(i) - accelerating angina Dx: - EKG, Tele - TSH, A1c, Lipid panel - TTE - Coronary angiogram Tx: - ASA 325 mg x 1 then ASA 81 mg daily  - Clopidogrel 75 mg daily rather than Ticagrelor 90 mg b.i.d. as he prefers once daily dosing - Atorvastatin 80 mg daily given suspicion for familial HL even despite elevated AST, monitor LFTs q4 weeks, modify therapy is >3x baseline - Toprol 25 mg daily -> Coreg 3.25 mg b.i.d. (given elevated TG), titrate to Hr<60 - Lisinopril 2.5 mg daily - Phase 1 CR - STOP SMOKING  #) Tobacco abuse  - Nicotine patch  #) Cv(p) - TTE  #) Cv(r) - n/a  #) Hypertriglyceridemia - TG 806; need to rule out familial hypertriglyceridemia Dx: - Fasting Lipid panel - TSH, Urine Alb/Cr r/o nephrotic syndrome Tx: - Lifestyle modification - Toprol 25 mg daily -> Coreg 3.25 mg b.i.d. (given elevated TG), titrate to Hr<60 - If Fasting TG >886, consider fibrates to prevent pancreatitis  #) DM - A1c - Hold Metformin for LHC  #) HTN - Coreg 3.25 mg b.i.d.  #) Fibromyalgia - Oxycodone prn  #) FULL CODE

## 2017-01-30 NOTE — Progress Notes (Signed)
Echocardiogram 2D Echocardiogram has been performed.  Howard Nelson 01/30/2017, 9:16 AM

## 2017-01-30 NOTE — Progress Notes (Signed)
ANTICOAGULATION CONSULT NOTE - Initial Consult  Pharmacy Consult for heparin Indication: chest pain/ACS  Allergies  Allergen Reactions  . Ampicillin     REACTION: Headache, diarrhea    Patient Measurements: Height: 5\' 10"  (177.8 cm) Weight: 199 lb 11.2 oz (90.6 kg) IBW/kg (Calculated) : 73 Heparin Dosing Weight: 90.6  Vital Signs: Temp: 98.3 F (36.8 C) (02/23 0533) Temp Source: Oral (02/23 0533) BP: 127/76 (02/23 0533) Pulse Rate: 75 (02/23 0533)  Labs:  Recent Labs  01/29/17 1606 01/29/17 2321 01/30/17 0113  HGB 14.6 14.2  --   HCT 42.1 40.0  --   PLT 202 195  --   APTT  --  26  --   LABPROT 12.2 11.6  --   INR 0.91 0.85  --   CREATININE 0.78 0.81  --   TROPONINI  --  <0.03 <0.03    Estimated Creatinine Clearance: 113.9 mL/min (by C-G formula based on SCr of 0.81 mg/dL).   Medical History: Past Medical History:  Diagnosis Date  . Chest pain   . Coronary atherosclerosis of native coronary artery 2009   stent  . Depression   . Dyslipidemia   . Fibromyalgia   . HTN (hypertension)   . NSTEMI (non-ST elevated myocardial infarction) (HCC)     Medications:  Prescriptions Prior to Admission  Medication Sig Dispense Refill Last Dose  . acetaminophen (TYLENOL) 500 MG tablet Take 500 mg by mouth as needed.   Taking  . aspirin 81 MG tablet Take 81 mg by mouth daily.   Taking  . diclofenac sodium (VOLTAREN) 1 % GEL Apply 1 application topically as needed.   Taking  . DULoxetine (CYMBALTA) 60 MG capsule Take 60 mg by mouth daily.   Taking  . fluticasone (FLONASE) 50 MCG/ACT nasal spray Place 2 sprays into both nostrils daily.   Taking  . glipiZIDE (GLUCOTROL) 10 MG tablet Take 10 mg by mouth daily before breakfast.   Taking  . lisinopril (PRINIVIL,ZESTRIL) 2.5 MG tablet Take 1 tablet by mouth daily.   Taking  . loratadine-pseudoephedrine (CLARITIN-D 24-HOUR) 10-240 MG per 24 hr tablet Take 1 tablet by mouth daily as needed.    Taking  . metFORMIN  (GLUCOPHAGE-XR) 500 MG 24 hr tablet Take 1,000 mg by mouth daily.  3 Taking  . metoprolol succinate (TOPROL-XL) 25 MG 24 hr tablet Take 25 mg by mouth daily.   Taking  . Multiple Minerals (CALCIUM/MAGNESIUM/ZINC PO) Take 2 tablets by mouth daily.   Taking  . nitroGLYCERIN (NITROSTAT) 0.4 MG SL tablet Place 0.4 mg under the tongue every 5 (five) minutes as needed.   Taking  . omeprazole (PRILOSEC) 20 MG capsule Take 20 mg by mouth 2 (two) times daily before a meal.    Taking  . oxycodone (ROXICODONE) 30 MG immediate release tablet Take 30 mg by mouth every 4 (four) hours as needed for pain.   Taking  . pravastatin (PRAVACHOL) 40 MG tablet Take 1 tablet (40 mg total) by mouth every evening. 90 tablet 3     Assessment: 56 [facility].  Pt given heparin 4000 unit bolus and heparin drip at 1000 units/hr at 0332 per orders from Dr Lynelle DoctorKnapp.   Wt 90.6 kg, trop neg x 2, CBC WNL, INR 0.85. Creat WNL.  Pt on cath lab schedule for 11 am today.   Goal of Therapy:  Heparin level 0.3-0.7 units/ml Monitor platelets by anticoagulation protocol: Yes   Plan:  Continue heparin at  current rate of 1000 units/hr Check Heparin level and CBC now F/u cath lab plans  Herby Abraham, Pharm.D. [phone] 01/30/2017 8:43 AM

## 2017-01-30 NOTE — Progress Notes (Signed)
   The patient was admitted by our team earlier this morning. Labs are stable. I checked for the cath lab and he is on the schedule. I will check to be sure that orders are complete.  Jerral BonitoJeff Rashid Whitenight, MD

## 2017-01-30 NOTE — Brief Op Note (Signed)
Brief Cardiac Catheterization Note (Full Report to Follow)  Date: 01/30/2017 Time: 3:55 PM  PATIENT:  Dorthula NettlesBryan L Arrighi Sr.  56 y.o. male  PRE-OPERATIVE DIAGNOSIS:  Chest pain  POST-OPERATIVE DIAGNOSIS:  Non-obstructive coronary artery disease  PROCEDURE:  Procedure(s): Left Heart Cath and Coronary Angiography (N/A)  SURGEON:  Surgeon(s) and Role:    * Yvonne Kendallhristopher Samaira Holzworth, MD - Primary  FINDINGS: 1.  Non-obstructive CAD with patient stents in the proximal and mid RCA. Distal branches are small and diffusely diseased. 2.  Normal left ventricular filling pressure and contraction.  RECOMMENDATIONS: 1. Initiate isosorbide mononitrate for small vessel disease. 2. Aggressive secondary prevention.  Yvonne Kendallhristopher Amirra Herling, MD [facility] Pager: [phone]

## 2017-01-31 ENCOUNTER — Encounter (HOSPITAL_COMMUNITY): Payer: Self-pay | Admitting: Cardiology

## 2017-01-31 DIAGNOSIS — R0789 Other chest pain: Secondary | ICD-10-CM

## 2017-01-31 DIAGNOSIS — E118 Type 2 diabetes mellitus with unspecified complications: Secondary | ICD-10-CM

## 2017-01-31 LAB — CBC
HEMATOCRIT: 40.6 % (ref 39.0–52.0)
Hemoglobin: 13.6 g/dL (ref 13.0–17.0)
MCH: 31 pg (ref 26.0–34.0)
MCHC: 33.5 g/dL (ref 30.0–36.0)
MCV: 92.5 fL (ref 78.0–100.0)
PLATELETS: 162 10*3/uL (ref 150–400)
RBC: 4.39 MIL/uL (ref 4.22–5.81)
RDW: 13.1 % (ref 11.5–15.5)
WBC: 5.2 10*3/uL (ref 4.0–10.5)

## 2017-01-31 LAB — ECHOCARDIOGRAM COMPLETE
Height: 70 in
WEIGHTICAEL: 3195.2 [oz_av]

## 2017-01-31 LAB — HEMOGLOBIN A1C
HEMOGLOBIN A1C: 7.8 % — AB (ref 4.8–5.6)
Mean Plasma Glucose: 177 mg/dL

## 2017-01-31 LAB — BASIC METABOLIC PANEL
Anion gap: 6 (ref 5–15)
BUN: 12 mg/dL (ref 6–20)
CHLORIDE: 107 mmol/L (ref 101–111)
CO2: 28 mmol/L (ref 22–32)
Calcium: 9 mg/dL (ref 8.9–10.3)
Creatinine, Ser: 0.76 mg/dL (ref 0.61–1.24)
GFR calc Af Amer: >60 mL/min (ref >60)
GFR calc non Af Amer: >60 mL/min (ref >60)
GLUCOSE: 134 mg/dL — AB (ref 65–99)
POTASSIUM: 4 mmol/L (ref 3.5–5.1)
SODIUM: 141 mmol/L (ref 135–145)

## 2017-01-31 MED ORDER — PRAVASTATIN SODIUM 40 MG PO TABS: 40.0000 mg | ORAL_TABLET | Freq: Every day | ORAL | Status: DC

## 2017-01-31 MED ORDER — METFORMIN HCL ER 500 MG PO TB24: 1000.0000 mg | ORAL_TABLET | Freq: Every day | ORAL | 3 refills | Status: DC

## 2017-01-31 MED ORDER — NICOTINE 14 MG/24HR TD PT24: 14.0000 mg | MEDICATED_PATCH | Freq: Every day | TRANSDERMAL | 0 refills | Status: DC

## 2017-01-31 MED ORDER — ISOSORBIDE MONONITRATE ER 30 MG PO TB24: 30.0000 mg | ORAL_TABLET | Freq: Every day | ORAL | 6 refills | Status: DC

## 2017-01-31 MED ORDER — CARVEDILOL 3.125 MG PO TABS: 3.1250 mg | ORAL_TABLET | Freq: Two times a day (BID) | ORAL | 6 refills | Status: DC

## 2017-01-31 NOTE — Progress Notes (Signed)
Primary cardiologist:  Subjective:    Episode of chest pain while walking yesterday. No recurrent pains  Objective:   Temp:  [97.9 F (36.6 C)-98.3 F (36.8 C)] 97.9 F (36.6 C) (02/24 0454) Pulse Rate:  [64-79] 64 (02/24 0852) Resp:  [14-18] 14 (02/24 0454) BP: (107-157)/(64-92) 152/91 (02/24 0852) SpO2:  [66 %-100 %] 96 % (02/24 0454) Last BM Date: 01/29/17  Filed Weights   01/29/17 2321 01/30/17 0533  Weight: 199 lb (90.3 kg) 199 lb 11.2 oz (90.6 kg)    Intake/Output Summary (Last 24 hours) at 01/31/17 0957 Last data filed at 01/30/17 1700  Gross per 24 hour  Intake              775 ml  Output                0 ml  Net              775 ml    Telemetry: NSR  Exam:  General: NAD  HEENT: sclera clear, throat clear  Resp: CTAB  Cardiac: RRR, no m/r/g, no jvd  GI: abdomen soft, NT, Nd  MSK:no LE edema  Neuro: no focal deficits  Psych: appropriate affect  Lab Results:  Basic Metabolic Panel:  Recent Labs Lab 01/29/17 1606 01/29/17 2321  NA 136 138  K 4.5 4.0  CL 102 106  CO2 28 26  GLUCOSE 150* 224*  BUN 17 17  CREATININE 0.78 0.81  CALCIUM 9.1 8.7*    Liver Function Tests:  Recent Labs Lab 01/29/17 2321  AST 41  ALT 92*  ALKPHOS 89  BILITOT 0.4  PROT 6.6  ALBUMIN 3.7    CBC:  Recent Labs Lab 01/29/17 1606 01/29/17 2321 01/30/17 0945  WBC 7.8 7.7 5.8  HGB 14.6 14.2 12.7*  HCT 42.1 40.0 37.9*  MCV 90.3 90.9 91.3  PLT 202 195 159    Cardiac Enzymes:  Recent Labs Lab 01/29/17 2321 01/30/17 0113  TROPONINI <0.03 <0.03    BNP: No results for input(s): PROBNP in the last 8760 hours.  Coagulation:  Recent Labs Lab 01/29/17 1606 01/29/17 2321  INR 0.91 0.85    ECG:   Medications:   Scheduled Medications: . aspirin  81 mg Oral Daily  . atorvastatin  80 mg Oral Daily  . carvedilol  3.125 mg Oral BID WC  . DULoxetine  60 mg Oral Daily  . fluticasone  2 spray Each Nare Daily  . glipiZIDE  10 mg Oral  QAC breakfast  . isosorbide mononitrate  30 mg Oral Daily  . lisinopril  2.5 mg Oral Daily  . nicotine  14 mg Transdermal Daily  . pantoprazole  40 mg Oral Daily  . sodium chloride flush  3 mL Intravenous Q12H     Infusions: . sodium chloride Stopped (01/30/17 1615)     PRN Medications:  sodium chloride, acetaminophen, loratadine **AND** pseudoephedrine, nicotine polacrilex, nitroGLYCERIN, ondansetron (ZOFRAN) IV, oxyCODONE, sodium chloride flush, traMADol   01/30/17 cath Conclusions: 1. Mild to moderate, non-obstructive CAD involving the major epicardial coronary arteries. There is tapering of the distal branches, likely representing small vessel disease in the setting of diabetes and tobacco use. 2. Patent stents in the proximal and mid RCA with minimal in-stent restenosis. 3. Normal left ventricular contraction and filling pressure.  Recommendations: 1. Aggressive secondary prevention and medical therapy; will add isosorbide mononitrate for small vessel disease. 2. If chest pain has resolved, could consider discharge tonight after post-cath monitoring is  complete or tomorrow.  Assessment/Plan    1. CAD - history of BMS to RCA in Dec 2007 and DES to RCA in Nov 2014.  - troponins negative this admit - admitted with concern for unstable angina. Cath yesterday with mild to moderate nonobstructive CAD as reported above. There was some tapering of distal vesels suggesting small vessel disease. Patent stents - medical therapy with ASA, atorva 80, coreg 3.125mg  bid, imdur 30mg , lisinopril 2.5mg  - imdur just added yesterday, follow symptoms.  -echo pending this AM - post cath labs pending   2. Hyperlipidemia - lipid panel drawn at 10pm, unclear if true fasting panel. Very high TGs at 800 - has not been repeated yet. Of note TGs in 09/2015 were 91. I suspect elevation is due to nonfasting panel  - had not been on statin previously due to elevated LFTs, had been on zetia at one  point. Just recently started on prava 40mg  daily.  - will need to monitor LFTs as well as true fasting panel at follow up. He has already had breakfast this AM, unable to repeat FLP today.     Dina Rich, M.D

## 2017-01-31 NOTE — Progress Notes (Signed)
01/31/2017 4:55 PM Discharge AVS meds taken today and those due this evening reviewed.  Follow-up appointments and when to call md reviewed.  D/C IV and TELE.  Questions and concerns addressed.   D/C home per orders. Kathryne HitchAllen, Shamecca Whitebread C

## 2017-01-31 NOTE — Discharge Summary (Signed)
Discharge Summary    Patient ID: Howard Nelson.,  MRN: [id number], DOB/AGE: 1961-07-18 56 y.o.  Admit date: 01/29/2017 Discharge date: 01/31/2017  Primary Care Provider: Donzetta Sprung Primary Cardiologist: Dr. Wyline Mood   Discharge Diagnoses    Principal Problem:   Accelerating angina [facility]) Active Problems:   Hyperlipidemia   TOBACCO ABUSE   Essential hypertension   CORONARY ATHEROSCLEROSIS NATIVE CORONARY ARTERY   Type 2 diabetes mellitus with complication (HCC)   Allergies Allergies  Allergen Reactions  . Ampicillin Other (See Comments)    REACTION: Headache, diarrhea    Diagnostic Studies/Procedures    01/30/17 by Dr. Okey Dupre Procedures   Left Heart Cath and Coronary Angiography  Conclusion   Conclusions: 1. Mild to moderate, non-obstructive CAD involving the major epicardial coronary arteries. There is tapering of the distal branches, likely representing small vessel disease in the setting of diabetes and tobacco use. 2. Patent stents in the proximal and mid RCA with minimal in-stent restenosis. 3. Normal left ventricular contraction and filling pressure.  Recommendations: 1. Aggressive secondary prevention and medical therapy; will add isosorbide mononitrate for small vessel disease. 2. If chest pain has resolved, could consider discharge tonight after post-cath monitoring is complete or tomorrow.    ECHO 01/31/17 ------------------------------------------------------------------- Study Conclusions  - Left ventricle: The cavity size was normal. Wall thickness was   normal. Systolic function was normal. The estimated ejection   fraction was in the range of 55% to 60%. Wall motion was normal;   there were no regional wall motion abnormalities. Left   ventricular diastolic function parameters were normal.  _____________   History of Present Illness     56 year old male with a history of CAD status post a BMS to the mid- right coronary artery in December  2007 and DES to the proximal-right coronary artery in November 2014, diabetes, hypertension, hyperlipidemia, active tobacco abuse, and fibromyalgia who presented to [facility] 01/30/17  with accelerating angina.   He had begun having Lt sided chest pain and numbness in lt hand and fatigue, SOB and DOE.  On admit the pain was brought on by exertion.  Pain resolved with 1 hour of rest and or 1-2 NTG sl.  Episodes were occurring 1-3 times a day.  He was seen in office and Heart cath was planned but after leaving the office pain increased and was sent to ER.  Pain in ER resolved with ASA and NTG.      Hospital Course     Consultants: none   Pt went for cardiac cath on the 23rd with results as above.  Non obstructive disease, most likely small vessel disease in setting of diabetes and tobacco use.   His troponins are negative for MI.  His hgbA1c is elevated at 7.5 and will need to see PCP for treatment.  His triglycerides are also significantly elevated.   Discharge Vitals Blood pressure 136/83, pulse 75, temperature 98.1 F (36.7 C), temperature source Oral, resp. rate 20, height 5\' 10"  (1.778 m), weight 199 lb 11.2 oz (90.6 kg), SpO2 97 %.  Filed Weights   01/29/17 2321 01/30/17 0533  Weight: 199 lb (90.3 kg) 199 lb 11.2 oz (90.6 kg)    Labs & Radiologic Studies    CBC  Recent Labs  01/29/17 1606 01/29/17 2321 01/30/17 0945 01/31/17 1044  WBC 7.8 7.7 5.8 5.2  NEUTROABS 4.5 4.4  --   --   HGB 14.6 14.2 12.7* 13.6  HCT 42.1 40.0 37.9* 40.6  MCV 90.3 90.9 91.3 92.5  PLT 202 195 159 162   Basic Metabolic Panel  Recent Labs  01/29/17 2321 01/31/17 1044  NA 138 141  K 4.0 4.0  CL 106 107  CO2 26 28  GLUCOSE 224* 134*  BUN 17 12  CREATININE 0.81 0.76  CALCIUM 8.7* 9.0   Liver Function Tests  Recent Labs  01/29/17 2321  AST 41  ALT 92*  ALKPHOS 89  BILITOT 0.4  PROT 6.6  ALBUMIN 3.7   No results for input(s): LIPASE, AMYLASE in the last 72 hours. Cardiac  Enzymes  Recent Labs  01/29/17 2321 01/30/17 0113  TROPONINI <0.03 <0.03   BNP Invalid input(s): POCBNP D-Dimer No results for input(s): DDIMER in the last 72 hours. Hemoglobin A1C  Recent Labs  01/30/17 0625  HGBA1C 7.8*   Fasting Lipid Panel  Recent Labs  01/29/17 2322  CHOL 213*  HDL 29*  LDLCALC UNABLE TO CALCULATE IF TRIGLYCERIDE OVER 400 mg/dL  TRIG 829806*  CHOLHDL 7.3   Thyroid Function Tests  Recent Labs  01/30/17 0625  TSH 0.979   _____________  Dg Chest 2 View  Result Date: 01/29/2017 CLINICAL DATA:  Preoperative examination prior to cardiac catheterization. History of coronary artery disease and 3 previous MIs with stent placement. Current smoker. EXAM: CHEST  2 VIEW COMPARISON:  Chest x-ray dated January 27, 2017 and chest CT scan of the same date. FINDINGS: The lungs are well-expanded and clear. The heart and pulmonary vascularity are normal. The mediastinum is normal in width. There is no pleural effusion. There is multilevel degenerative disc disease of the thoracic spine. There is chronic widening of the AC joints greatest on the left. IMPRESSION: There is no active cardiopulmonary disease. Electronically Signed   By: David  SwazilandJordan M.D.   On: 01/29/2017 16:42   Disposition   Pt is being discharged home today in good condition.  Follow-up Plans & Appointments   [facility] [address] at [phone] if any bleeding, swelling or drainage at cath site.  May shower, no tub baths for 48 hours for groin sticks. No lifting over 5 pounds for 3 days.  No Driving for 3 days  Heart Healthy Diabetic Diet  We adjusted you meds.    Take 1 NTG, under your tongue, while sitting.  If no relief of pain may repeat NTG, one tab every 5 minutes up to 3 tablets total over 15 minutes.  If no relief CALL 911.  If you have dizziness/lightheadness  while taking NTG, stop taking and call 911.        Hold your metformin until Monday the 26th, it may  interact with the cath dye until then.    Stop smoking.     Follow-up Information    Dina RichBranch, Jonathan, MD Follow up.   Specialty:  Cardiology Why:  the office will call you for appt.  Contact information: [address] [place] [address] [phone]            Discharge Medications   Current Discharge Medication List    START taking these medications   Details  carvedilol (COREG) 3.125 MG tablet Take 1 tablet (3.125 mg total) by mouth 2 (two) times daily with a meal. Qty: 60 tablet, Refills: 6    isosorbide mononitrate (IMDUR) 30 MG 24 hr tablet Take 1 tablet (30 mg total) by mouth daily. Qty: 30 tablet, Refills: 6    nicotine (NICODERM CQ - DOSED IN MG/24 HOURS) 14 mg/24hr  patch Place 1 patch (14 mg total) onto the skin daily. Qty: 28 patch, Refills: 0      CONTINUE these medications which have CHANGED   Details  metFORMIN (GLUCOPHAGE-XR) 500 MG 24 hr tablet Take 2 tablets (1,000 mg total) by mouth at bedtime. Refills: 3      CONTINUE these medications which have NOT CHANGED   Details  acetaminophen (TYLENOL) 500 MG tablet Take 1,000 mg by mouth 3 (three) times daily as needed for headache (pain).     aspirin 81 MG chewable tablet Chew 81 mg by mouth at bedtime.    aspirin-acetaminophen-caffeine (EXCEDRIN MIGRAINE) 250-250-65 MG tablet Take 2 tablets by mouth daily as needed for headache.    Calcium-Magnesium-Zinc (CAL-MAG-ZINC PO) Take 1 tablet by mouth at bedtime.    diclofenac sodium (VOLTAREN) 1 % GEL Apply 1 application topically See admin instructions. Apply topically daily as needed for pain - use with roll-on menthol "Stopain"    DULoxetine (CYMBALTA) 60 MG capsule Take 60 mg by mouth at bedtime.     fluticasone (FLONASE) 50 MCG/ACT nasal spray Place 2-3 sprays into both nostrils daily as needed (congestion).     glipiZIDE (GLUCOTROL XL) 10 MG 24 hr tablet Take 10 mg by mouth at bedtime.    lisinopril (PRINIVIL,ZESTRIL) 2.5 MG tablet Take  2.5 mg by mouth at bedtime.     Menthol, Topical Analgesic, (STOPAIN ROLL-ON) 8 % LIQD Apply 1 application topically See admin instructions. Apply topicaly  daily as needed for pain - use with diclofenac gel    metoprolol succinate (TOPROL-XL) 25 MG 24 hr tablet Take 25 mg by mouth at bedtime.     Multiple Minerals (CALCIUM/MAGNESIUM/ZINC PO) Take 2 tablets by mouth at bedtime.     nitroGLYCERIN (NITROSTAT) 0.4 MG SL tablet Place 0.4 mg under the tongue every 5 (five) minutes as needed for chest pain.     omeprazole (PRILOSEC) 20 MG capsule Take 20 mg by mouth 2 (two) times daily as needed (acid reflux).     oxycodone (ROXICODONE) 30 MG immediate release tablet Take 30 mg by mouth every 4 (four) hours. Scheduled per RX    pravastatin (PRAVACHOL) 40 MG tablet Take 1 tablet (40 mg total) by mouth every evening. Qty: 90 tablet, Refills: 3      STOP taking these medications     Aspirin-Salicylamide-Caffeine (BC HEADACHE POWDER PO)      ibuprofen (ADVIL,MOTRIN) 200 MG tablet      loratadine-pseudoephedrine (CLARITIN-D 24-HOUR) 10-240 MG per 24 hr tablet      aspirin 81 MG tablet      glipiZIDE (GLUCOTROL) 10 MG tablet             Outstanding Labs/Studies   Would repeat BMP, Lipids and hepatic panel as an outpt.    Duration of Discharge Encounter   Greater than 30 minutes including physician time.  Signed, Nada Boozer NP 01/31/2017, 4:17 PM

## 2017-01-31 NOTE — Discharge Instructions (Signed)
[facility] [address] [facility] at [phone] if any bleeding, swelling or drainage at cath site.  May shower, no tub baths for 48 hours for groin sticks. No lifting over 5 pounds for 3 days.  No Driving for 3 days  Heart Healthy Diabetic Diet  We adjusted you meds.    Take 1 NTG, under your tongue, while sitting.  If no relief of pain may repeat NTG, one tab every 5 minutes up to 3 tablets total over 15 minutes.  If no relief CALL 911.  If you have dizziness/lightheadness  while taking NTG, stop taking and call 911.        Hold your metformin until Monday the 26th, it may interact with the cath dye until then.   Stop smoking very important.  We ordered nicoderm.

## 2017-02-02 DIAGNOSIS — M79604 Pain in right leg: Secondary | ICD-10-CM | POA: Diagnosis not present

## 2017-02-02 DIAGNOSIS — M25551 Pain in right hip: Secondary | ICD-10-CM | POA: Diagnosis not present

## 2017-02-02 DIAGNOSIS — M545 Low back pain: Secondary | ICD-10-CM | POA: Diagnosis not present

## 2017-02-02 DIAGNOSIS — Z79891 Long term (current) use of opiate analgesic: Secondary | ICD-10-CM | POA: Diagnosis not present

## 2017-02-02 DIAGNOSIS — G894 Chronic pain syndrome: Secondary | ICD-10-CM | POA: Diagnosis not present

## 2017-02-02 DIAGNOSIS — M79661 Pain in right lower leg: Secondary | ICD-10-CM | POA: Diagnosis not present

## 2017-02-02 DIAGNOSIS — M79605 Pain in left leg: Secondary | ICD-10-CM | POA: Diagnosis not present

## 2017-02-02 DIAGNOSIS — M25552 Pain in left hip: Secondary | ICD-10-CM | POA: Diagnosis not present

## 2017-02-03 ENCOUNTER — Encounter (HOSPITAL_COMMUNITY): Admission: RE | Payer: Self-pay | Source: Ambulatory Visit

## 2017-02-03 ENCOUNTER — Ambulatory Visit (HOSPITAL_COMMUNITY): Admission: RE | Admit: 2017-02-03 | Payer: Medicare Other | Source: Ambulatory Visit | Admitting: Cardiovascular Disease

## 2017-02-03 SURGERY — LEFT HEART CATH AND CORONARY ANGIOGRAPHY
Anesthesia: LOCAL

## 2017-02-05 DIAGNOSIS — M79605 Pain in left leg: Secondary | ICD-10-CM | POA: Diagnosis not present

## 2017-02-05 DIAGNOSIS — Z79891 Long term (current) use of opiate analgesic: Secondary | ICD-10-CM | POA: Diagnosis not present

## 2017-02-05 DIAGNOSIS — M25552 Pain in left hip: Secondary | ICD-10-CM | POA: Diagnosis not present

## 2017-02-05 DIAGNOSIS — M79604 Pain in right leg: Secondary | ICD-10-CM | POA: Diagnosis not present

## 2017-02-05 DIAGNOSIS — M79661 Pain in right lower leg: Secondary | ICD-10-CM | POA: Diagnosis not present

## 2017-02-05 DIAGNOSIS — M545 Low back pain: Secondary | ICD-10-CM | POA: Diagnosis not present

## 2017-02-05 DIAGNOSIS — M25551 Pain in right hip: Secondary | ICD-10-CM | POA: Diagnosis not present

## 2017-02-05 DIAGNOSIS — G894 Chronic pain syndrome: Secondary | ICD-10-CM | POA: Diagnosis not present

## 2017-02-10 ENCOUNTER — Encounter: Payer: Self-pay | Admitting: Adult Health

## 2017-02-10 ENCOUNTER — Ambulatory Visit (INDEPENDENT_AMBULATORY_CARE_PROVIDER_SITE_OTHER): Payer: Medicare Other | Admitting: Adult Health

## 2017-02-10 VITALS — BP 134/84 | HR 86 | Ht 70.0 in | Wt 200.0 lb

## 2017-02-10 DIAGNOSIS — I10 Essential (primary) hypertension: Secondary | ICD-10-CM | POA: Diagnosis not present

## 2017-02-10 DIAGNOSIS — E7800 Pure hypercholesterolemia, unspecified: Secondary | ICD-10-CM | POA: Diagnosis not present

## 2017-02-10 DIAGNOSIS — I209 Angina pectoris, unspecified: Secondary | ICD-10-CM | POA: Diagnosis not present

## 2017-02-10 DIAGNOSIS — Z720 Tobacco use: Secondary | ICD-10-CM | POA: Diagnosis not present

## 2017-02-10 DIAGNOSIS — I2510 Atherosclerotic heart disease of native coronary artery without angina pectoris: Secondary | ICD-10-CM

## 2017-02-10 NOTE — Progress Notes (Signed)
Cardiology Office Note   Date:  02/10/2017   ID:  Howard Nettles Sr., DOB 03-07-1961, MRN [id number]  PCP:  Donzetta Sprung, MD  Cardiologist: Arlington Calix, NP   Chief Complaint  Patient presents with  . Coronary Artery Disease  . Chest Pain      History of Present Illness: Howard Nelson. is a 56 y.o. male who presents for ongoing assessment and management of coronary artery disease, status post bare metal stent to the mid right coronary artery in December 2017 and drug-eluting stent to the proximal right coronary artery in November 2014, diabetes, hypertension, hyperlipidemia, with history of depression and fibromyalgia. The patient was hospitalized on 01/30/2017 in the setting of accelerating angina.  The patient had been scheduled for cardiac catheterization when last seen in the office on 02/03/2017, but following clinic visit the patient began to have 8/10 chest discomfort, took a nitroglycerin and called 911. The patient did undergo cardiac catheterization on 01/30/2017. This revealed nonobstructive CAD with patent stents in the proximal and mid RCA. Distal branches are small and diffusely disease. He had normal LV systolic pressure and contraction. He was started on isosorbide mononitrate for small vessel disease. He was sent home on carvedilol 3.125 mg twice a day, isosorbide 30 mg daily, and advised to quit smoking.  He was also noted to have elevated hemoglobin A1c at 7.5. The patient was advised to follow-up with primary care physician for management.  He continues chronic chest pain, and has been followed by different pain management clinics. He talks at length about problems obtaining pain medication. He unfortunately continues to smoke.  Past Medical History:  Diagnosis Date  . Chest pain   . Coronary atherosclerosis of native coronary artery 2009   stent  . Depression   . Dyslipidemia   . Fibromyalgia   . HTN (hypertension)   . NSTEMI (non-ST elevated  myocardial infarction) [facility])     Past Surgical History:  Procedure Laterality Date  . CORONARY STENT PLACEMENT    . LEFT HEART CATH AND CORONARY ANGIOGRAPHY N/A 01/30/2017   Procedure: Left Heart Cath and Coronary Angiography;  Surgeon: Yvonne Kendall, MD;  Location: [facility] INVASIVE CV LAB;  Service: Cardiovascular;  Laterality: N/A;     Current Outpatient Prescriptions  Medication Sig Dispense Refill  . acetaminophen (TYLENOL) 500 MG tablet Take 1,000 mg by mouth 3 (three) times daily as needed for headache (pain).     Marland Kitchen aspirin 81 MG chewable tablet Chew 81 mg by mouth at bedtime.    Marland Kitchen aspirin-acetaminophen-caffeine (EXCEDRIN MIGRAINE) 250-250-65 MG tablet Take 2 tablets by mouth daily as needed for headache.    . Calcium-Magnesium-Zinc (CAL-MAG-ZINC PO) Take 1 tablet by mouth at bedtime.    . carvedilol (COREG) 3.125 MG tablet Take 1 tablet (3.125 mg total) by mouth 2 (two) times daily with a meal. 60 tablet 6  . diclofenac sodium (VOLTAREN) 1 % GEL Apply 1 application topically See admin instructions. Apply topically daily as needed for pain - use with roll-on menthol "Stopain"    . DULoxetine (CYMBALTA) 60 MG capsule Take 60 mg by mouth at bedtime.     . fluticasone (FLONASE) 50 MCG/ACT nasal spray Place 2-3 sprays into both nostrils daily as needed (congestion).     Marland Kitchen glipiZIDE (GLUCOTROL XL) 10 MG 24 hr tablet Take 10 mg by mouth at bedtime.    . isosorbide mononitrate (IMDUR) 30 MG 24 hr tablet Take 1 tablet (30 mg total) by mouth  daily. 30 tablet 6  . lisinopril (PRINIVIL,ZESTRIL) 2.5 MG tablet Take 2.5 mg by mouth at bedtime.     Marland Kitchen loratadine (CLARITIN) 10 MG tablet Take 10 mg by mouth daily.    . Menthol, Topical Analgesic, (STOPAIN ROLL-ON) 8 % LIQD Apply 1 application topically See admin instructions. Apply topicaly  daily as needed for pain - use with diclofenac gel    . metFORMIN (GLUCOPHAGE-XR) 500 MG 24 hr tablet Take 2 tablets (1,000 mg total) by mouth at bedtime.  3  .  metoprolol succinate (TOPROL-XL) 25 MG 24 hr tablet Take 25 mg by mouth at bedtime.     . Multiple Minerals (CALCIUM/MAGNESIUM/ZINC PO) Take 2 tablets by mouth at bedtime.     . nicotine (NICODERM CQ - DOSED IN MG/24 HOURS) 14 mg/24hr patch Place 1 patch (14 mg total) onto the skin daily. 28 patch 0  . nitroGLYCERIN (NITROSTAT) 0.4 MG SL tablet Place 0.4 mg under the tongue every 5 (five) minutes as needed for chest pain.     Marland Kitchen omeprazole (PRILOSEC) 20 MG capsule Take 20 mg by mouth 2 (two) times daily as needed (acid reflux).     Marland Kitchen oxycodone (ROXICODONE) 30 MG immediate release tablet Take 30 mg by mouth every 4 (four) hours. Scheduled per RX    . pravastatin (PRAVACHOL) 40 MG tablet Take 1 tablet (40 mg total) by mouth every evening. 90 tablet 3   No current facility-administered medications for this visit.     Allergies:   Ampicillin    Social History:  The patient  reports that he has been smoking Cigarettes.  He started smoking about 44 years ago. He has been smoking about 1.00 pack per day. He has never used smokeless tobacco. He reports that he does not use drugs.   Family History:  The patient's family history includes Coronary artery disease in his father and mother.    ROS: All other systems are reviewed and negative. Unless otherwise mentioned in H&P    PHYSICAL EXAM: VS:  BP 134/84   Pulse 86   Ht 5\' 10"  (1.778 m)   Wt 200 lb (90.7 kg)   SpO2 95%   BMI 28.70 kg/m  , BMI Body mass index is 28.7 kg/m. GEN: Well nourished, well developed, in no acute distress  HEENT: normal  Neck: no JVD, carotid bruits, or masses Cardiac: RRR; no murmurs, rubs, or gallops,no edema  Respiratory:  clear to auscultation bilaterally, normal work of breathing GI: soft, nontender, nondistended, + BS MS: no deformity or atrophy  Skin: warm and dry, no rash Neuro:  Strength and sensation are intact Psych: euthymic mood, full affect   Recent Labs: 01/29/2017: ALT 92 01/30/2017: TSH  0.979 Feb 20, 2017: BUN 12; Creatinine, Ser 0.76; Hemoglobin 13.6; Platelets 162; Potassium 4.0; Sodium 141    Lipid Panel    Component Value Date/Time   CHOL 213 (H) 01/29/2017 2322   TRIG 806 (H) 01/29/2017 2322   HDL 29 (L) 01/29/2017 2322   CHOLHDL 7.3 01/29/2017 2322   VLDL UNABLE TO CALCULATE IF TRIGLYCERIDE OVER 400 mg/dL 16/09/9603 5409   LDLCALC UNABLE TO CALCULATE IF TRIGLYCERIDE OVER 400 mg/dL 81/19/1478 2956      Wt Readings from Last 3 Encounters:  02/10/17 200 lb (90.7 kg)  01/30/17 199 lb 11.2 oz (90.6 kg)  01/29/17 199 lb (90.3 kg)      Other studies Reviewed: Echocardiogram 20-Feb-2017.   - Left ventricle: The cavity size was normal. Wall thickness was normal.  Systolic function was normal. The estimated ejection fraction was in the range of 55% to 60%. Wall motion was normal; there were no regional wall motion abnormalities. Left ventricular diastolic function parameters were normal.  ASSESSMENT AND PLAN:  1.  Coronary artery disease: I have reviewed his cardiac catheterization results, we'll continue secondary prevention, patient will remain on beta blocker, statin therapy, and aspirin. The patient has nitroglycerin sublingual when necessary  2. Hypertension: Blood pressure is currently controlled. No changes in his medication regimen, continue lisinopril 2.5 mg for renal protection along with above medications.  3. Ongoing tobacco abuse: The patient continues to smoke a pack or more a day. I have spoken with him at length about smoking cessation and its contribution to CAD. He verbalizes understanding. He is not inclined to quit at this time.  4. Chronic chest pain: We'll defer to primary care and pain management.  5. Diabetes: Deferred to primary care. A1c was elevated and will need better control.   Current medicines are reviewed at length with the patient today.    Labs/ tests ordered today include:  No orders of the defined types were placed  in this encounter.    Disposition:   FU with 6 months no changes  Signed, Joni ReiningKathryn Ellijah Leffel, NP  02/10/2017 5:26 PM    [facility] [address], [place], [place] [address] Phone: [phone]; Fax: [phone]

## 2017-02-10 NOTE — Progress Notes (Signed)
Name: Howard HANSEN Sr.    DOB: 1960/12/26  Age: 56 y.o.  MR#: [id number]       PCP:  Donzetta Sprung, MD      Insurance: Payor: MEDICARE / Plan: MEDICARE PART A AND B / Product Type: *No Product type* /   CC:   No chief complaint on file.   VS Vitals:   02/10/17 1445  Pulse: 86  SpO2: 95%  Weight: 200 lb (90.7 kg)  Height: 5\' 10"  (1.778 m)    Weights Current Weight  02/10/17 200 lb (90.7 kg)  01/30/17 199 lb 11.2 oz (90.6 kg)  01/29/17 199 lb (90.3 kg)    Blood Pressure  BP Readings from Last 3 Encounters:  01/31/17 136/83  01/29/17 138/80  10/25/15 137/83     Admit date:  (Not on file) Last encounter with RMR:  01/29/2017   Allergy Ampicillin  Current Outpatient Prescriptions  Medication Sig Dispense Refill  . acetaminophen (TYLENOL) 500 MG tablet Take 1,000 mg by mouth 3 (three) times daily as needed for headache (pain).     Marland Kitchen aspirin 81 MG chewable tablet Chew 81 mg by mouth at bedtime.    Marland Kitchen aspirin-acetaminophen-caffeine (EXCEDRIN MIGRAINE) 250-250-65 MG tablet Take 2 tablets by mouth daily as needed for headache.    . Calcium-Magnesium-Zinc (CAL-MAG-ZINC PO) Take 1 tablet by mouth at bedtime.    . carvedilol (COREG) 3.125 MG tablet Take 1 tablet (3.125 mg total) by mouth 2 (two) times daily with a meal. 60 tablet 6  . diclofenac sodium (VOLTAREN) 1 % GEL Apply 1 application topically See admin instructions. Apply topically daily as needed for pain - use with roll-on menthol "Stopain"    . DULoxetine (CYMBALTA) 60 MG capsule Take 60 mg by mouth at bedtime.     . fluticasone (FLONASE) 50 MCG/ACT nasal spray Place 2-3 sprays into both nostrils daily as needed (congestion).     Marland Kitchen glipiZIDE (GLUCOTROL XL) 10 MG 24 hr tablet Take 10 mg by mouth at bedtime.    . isosorbide mononitrate (IMDUR) 30 MG 24 hr tablet Take 1 tablet (30 mg total) by mouth daily. 30 tablet 6  . lisinopril (PRINIVIL,ZESTRIL) 2.5 MG tablet Take 2.5 mg by mouth at bedtime.     Marland Kitchen loratadine (CLARITIN) 10  MG tablet Take 10 mg by mouth daily.    . Menthol, Topical Analgesic, (STOPAIN ROLL-ON) 8 % LIQD Apply 1 application topically See admin instructions. Apply topicaly  daily as needed for pain - use with diclofenac gel    . metFORMIN (GLUCOPHAGE-XR) 500 MG 24 hr tablet Take 2 tablets (1,000 mg total) by mouth at bedtime.  3  . metoprolol succinate (TOPROL-XL) 25 MG 24 hr tablet Take 25 mg by mouth at bedtime.     . Multiple Minerals (CALCIUM/MAGNESIUM/ZINC PO) Take 2 tablets by mouth at bedtime.     . nicotine (NICODERM CQ - DOSED IN MG/24 HOURS) 14 mg/24hr patch Place 1 patch (14 mg total) onto the skin daily. 28 patch 0  . nitroGLYCERIN (NITROSTAT) 0.4 MG SL tablet Place 0.4 mg under the tongue every 5 (five) minutes as needed for chest pain.     Marland Kitchen omeprazole (PRILOSEC) 20 MG capsule Take 20 mg by mouth 2 (two) times daily as needed (acid reflux).     Marland Kitchen oxycodone (ROXICODONE) 30 MG immediate release tablet Take 30 mg by mouth every 4 (four) hours. Scheduled per RX    . pravastatin (PRAVACHOL) 40 MG tablet Take 1 tablet (  40 mg total) by mouth every evening. 90 tablet 3   No current facility-administered medications for this visit.     Discontinued Meds:   There are no discontinued medications.  Patient Active Problem List   Diagnosis Date Noted  . Type 2 diabetes mellitus with complication (HCC) 01/31/2017  . Accelerating angina (HCC) 01/30/2017  . TOBACCO ABUSE 12/24/2009  . EPIGASTRIC PAIN 12/24/2009  . Hyperlipidemia 06/15/2009  . DEPRESSION 06/15/2009  . Essential hypertension 06/15/2009  . CORONARY ATHEROSCLEROSIS NATIVE CORONARY ARTERY 06/15/2009  . FIBROMYALGIA 06/15/2009  . CHEST PAIN 06/15/2009    LABS    Component Value Date/Time   NA 141 01/31/2017 1044   NA 138 01/29/2017 2321   NA 136 01/29/2017 1606   K 4.0 01/31/2017 1044   K 4.0 01/29/2017 2321   K 4.5 01/29/2017 1606   CL 107 01/31/2017 1044   CL 106 01/29/2017 2321   CL 102 01/29/2017 1606   CO2 28  01/31/2017 1044   CO2 26 01/29/2017 2321   CO2 28 01/29/2017 1606   GLUCOSE 134 (H) 01/31/2017 1044   GLUCOSE 224 (H) 01/29/2017 2321   GLUCOSE 150 (H) 01/29/2017 1606   BUN 12 01/31/2017 1044   BUN 17 01/29/2017 2321   BUN 17 01/29/2017 1606   CREATININE 0.76 01/31/2017 1044   CREATININE 0.81 01/29/2017 2321   CREATININE 0.78 01/29/2017 1606   CALCIUM 9.0 01/31/2017 1044   CALCIUM 8.7 (L) 01/29/2017 2321   CALCIUM 9.1 01/29/2017 1606   GFRNONAA >60 01/31/2017 1044   GFRNONAA >60 01/29/2017 2321   GFRNONAA >60 01/29/2017 1606   GFRAA >60 01/31/2017 1044   GFRAA >60 01/29/2017 2321   GFRAA >60 01/29/2017 1606   CMP     Component Value Date/Time   NA 141 01/31/2017 1044   K 4.0 01/31/2017 1044   CL 107 01/31/2017 1044   CO2 28 01/31/2017 1044   GLUCOSE 134 (H) 01/31/2017 1044   BUN 12 01/31/2017 1044   CREATININE 0.76 01/31/2017 1044   CALCIUM 9.0 01/31/2017 1044   PROT 6.6 01/29/2017 2321   ALBUMIN 3.7 01/29/2017 2321   AST 41 01/29/2017 2321   ALT 92 (H) 01/29/2017 2321   ALKPHOS 89 01/29/2017 2321   BILITOT 0.4 01/29/2017 2321   GFRNONAA >60 01/31/2017 1044   GFRAA >60 01/31/2017 1044       Component Value Date/Time   WBC 5.2 01/31/2017 1044   WBC 5.8 01/30/2017 0945   WBC 7.7 01/29/2017 2321   HGB 13.6 01/31/2017 1044   HGB 12.7 (L) 01/30/2017 0945   HGB 14.2 01/29/2017 2321   HCT 40.6 01/31/2017 1044   HCT 37.9 (L) 01/30/2017 0945   HCT 40.0 01/29/2017 2321   MCV 92.5 01/31/2017 1044   MCV 91.3 01/30/2017 0945   MCV 90.9 01/29/2017 2321    Lipid Panel     Component Value Date/Time   CHOL 213 (H) 01/29/2017 2322   TRIG 806 (H) 01/29/2017 2322   HDL 29 (L) 01/29/2017 2322   CHOLHDL 7.3 01/29/2017 2322   VLDL UNABLE TO CALCULATE IF TRIGLYCERIDE OVER 400 mg/dL 16/10/960402/22/2018 [id number]   LDLCALC UNABLE TO CALCULATE IF TRIGLYCERIDE OVER 400 mg/dL 81/19/147802/22/2018 [id number]    ABG No results found for: PHART, PCO2ART, PO2ART, HCO3, TCO2, ACIDBASEDEF, O2SAT   Lab  Results  Component Value Date   TSH 0.979 01/30/2017   BNP (last 3 results) No results for input(s): BNP in the last 8760 hours.  ProBNP (last 3 results) No  results for input(s): PROBNP in the last 8760 hours.  Cardiac Panel (last 3 results) No results for input(s): CKTOTAL, CKMB, TROPONINI, RELINDX in the last 72 hours.  Iron/TIBC/Ferritin/ %Sat No results found for: IRON, TIBC, FERRITIN, IRONPCTSAT   EKG Orders placed or performed during the hospital encounter of 01/29/17  . EKG 12-Lead  . EKG 12-Lead  . EKG 12-Lead     Prior Assessment and Plan Problem List as of 02/10/2017 Reviewed: 01/29/2017  4:38 PM by Joni Reining, NP     Cardiovascular and Mediastinum   Essential hypertension   CORONARY ATHEROSCLEROSIS NATIVE CORONARY ARTERY   Accelerating angina (HCC)     Endocrine   Type 2 diabetes mellitus with complication (HCC)     Musculoskeletal and Integument   FIBROMYALGIA     Other   Hyperlipidemia   TOBACCO ABUSE   DEPRESSION   CHEST PAIN   EPIGASTRIC PAIN       Imaging: Dg Chest 2 View  Result Date: 01/29/2017 CLINICAL DATA:  Preoperative examination prior to cardiac catheterization. History of coronary artery disease and 3 previous MIs with stent placement. Current smoker. EXAM: CHEST  2 VIEW COMPARISON:  Chest x-ray dated January 27, 2017 and chest CT scan of the same date. FINDINGS: The lungs are well-expanded and clear. The heart and pulmonary vascularity are normal. The mediastinum is normal in width. There is no pleural effusion. There is multilevel degenerative disc disease of the thoracic spine. There is chronic widening of the AC joints greatest on the left. IMPRESSION: There is no active cardiopulmonary disease. Electronically Signed   By: David  [place] M.D.   On: 01/29/2017 16:42

## 2017-02-10 NOTE — Patient Instructions (Signed)
Your physician wants you to follow-up in: 6 months You will receive a reminder letter in the mail two months in advance. If you don't receive a letter, please call our office to schedule the follow-up appointment.    Your physician recommends that you continue on your current medications as directed. Please refer to the Current Medication list given to you today.    If you need a refill on your cardiac medications before your next appointment, please call your pharmacy.     Thank you for choosing [facility] !        

## 2017-02-26 DIAGNOSIS — Z6828 Body mass index (BMI) 28.0-28.9, adult: Secondary | ICD-10-CM | POA: Diagnosis not present

## 2017-02-26 DIAGNOSIS — I2510 Atherosclerotic heart disease of native coronary artery without angina pectoris: Secondary | ICD-10-CM | POA: Diagnosis not present

## 2017-03-04 DIAGNOSIS — K210 Gastro-esophageal reflux disease with esophagitis: Secondary | ICD-10-CM | POA: Diagnosis not present

## 2017-03-04 DIAGNOSIS — E119 Type 2 diabetes mellitus without complications: Secondary | ICD-10-CM | POA: Diagnosis not present

## 2017-03-04 DIAGNOSIS — M1990 Unspecified osteoarthritis, unspecified site: Secondary | ICD-10-CM | POA: Diagnosis not present

## 2017-03-04 DIAGNOSIS — I10 Essential (primary) hypertension: Secondary | ICD-10-CM | POA: Diagnosis not present

## 2017-03-04 DIAGNOSIS — E8881 Metabolic syndrome: Secondary | ICD-10-CM | POA: Diagnosis not present

## 2017-03-04 DIAGNOSIS — E782 Mixed hyperlipidemia: Secondary | ICD-10-CM | POA: Diagnosis not present

## 2017-03-10 DIAGNOSIS — Z23 Encounter for immunization: Secondary | ICD-10-CM | POA: Diagnosis not present

## 2017-03-10 DIAGNOSIS — E8881 Metabolic syndrome: Secondary | ICD-10-CM | POA: Diagnosis not present

## 2017-03-10 DIAGNOSIS — I10 Essential (primary) hypertension: Secondary | ICD-10-CM | POA: Diagnosis not present

## 2017-03-10 DIAGNOSIS — E782 Mixed hyperlipidemia: Secondary | ICD-10-CM | POA: Diagnosis not present

## 2017-03-10 DIAGNOSIS — F331 Major depressive disorder, recurrent, moderate: Secondary | ICD-10-CM | POA: Diagnosis not present

## 2017-03-10 DIAGNOSIS — F17210 Nicotine dependence, cigarettes, uncomplicated: Secondary | ICD-10-CM | POA: Diagnosis not present

## 2017-03-10 DIAGNOSIS — E119 Type 2 diabetes mellitus without complications: Secondary | ICD-10-CM | POA: Diagnosis not present

## 2017-03-10 DIAGNOSIS — Z6827 Body mass index (BMI) 27.0-27.9, adult: Secondary | ICD-10-CM | POA: Diagnosis not present

## 2017-04-01 DIAGNOSIS — Z6829 Body mass index (BMI) 29.0-29.9, adult: Secondary | ICD-10-CM | POA: Diagnosis not present

## 2017-04-01 DIAGNOSIS — L0390 Cellulitis, unspecified: Secondary | ICD-10-CM | POA: Diagnosis not present

## 2017-04-28 DIAGNOSIS — G8929 Other chronic pain: Secondary | ICD-10-CM | POA: Diagnosis not present

## 2017-04-28 DIAGNOSIS — M797 Fibromyalgia: Secondary | ICD-10-CM | POA: Diagnosis not present

## 2017-04-28 DIAGNOSIS — M5136 Other intervertebral disc degeneration, lumbar region: Secondary | ICD-10-CM | POA: Diagnosis not present

## 2017-04-28 DIAGNOSIS — M150 Primary generalized (osteo)arthritis: Secondary | ICD-10-CM | POA: Diagnosis not present

## 2017-04-28 DIAGNOSIS — M4712 Other spondylosis with myelopathy, cervical region: Secondary | ICD-10-CM | POA: Diagnosis not present

## 2017-04-28 DIAGNOSIS — Z79899 Other long term (current) drug therapy: Secondary | ICD-10-CM | POA: Diagnosis not present

## 2017-04-28 DIAGNOSIS — M47817 Spondylosis without myelopathy or radiculopathy, lumbosacral region: Secondary | ICD-10-CM | POA: Diagnosis not present

## 2017-05-01 DIAGNOSIS — M4712 Other spondylosis with myelopathy, cervical region: Secondary | ICD-10-CM | POA: Diagnosis not present

## 2017-05-01 DIAGNOSIS — M47812 Spondylosis without myelopathy or radiculopathy, cervical region: Secondary | ICD-10-CM | POA: Diagnosis not present

## 2017-05-11 DIAGNOSIS — M5136 Other intervertebral disc degeneration, lumbar region: Secondary | ICD-10-CM | POA: Diagnosis not present

## 2017-05-11 DIAGNOSIS — M47817 Spondylosis without myelopathy or radiculopathy, lumbosacral region: Secondary | ICD-10-CM | POA: Diagnosis not present

## 2017-05-23 IMAGING — DX DG CHEST 2V
2 series · 2 of 2 positions shown · non-contrast
Comparison: Chest x-ray dated January 27, 2017 and chest CT scan
of the same date.

CLINICAL DATA: Preoperative examination prior to cardiac
catheterization. History of coronary artery disease and 3 previous
MIs with stent placement. Current smoker.

EXAM:
CHEST  2 VIEW

[chest pa]
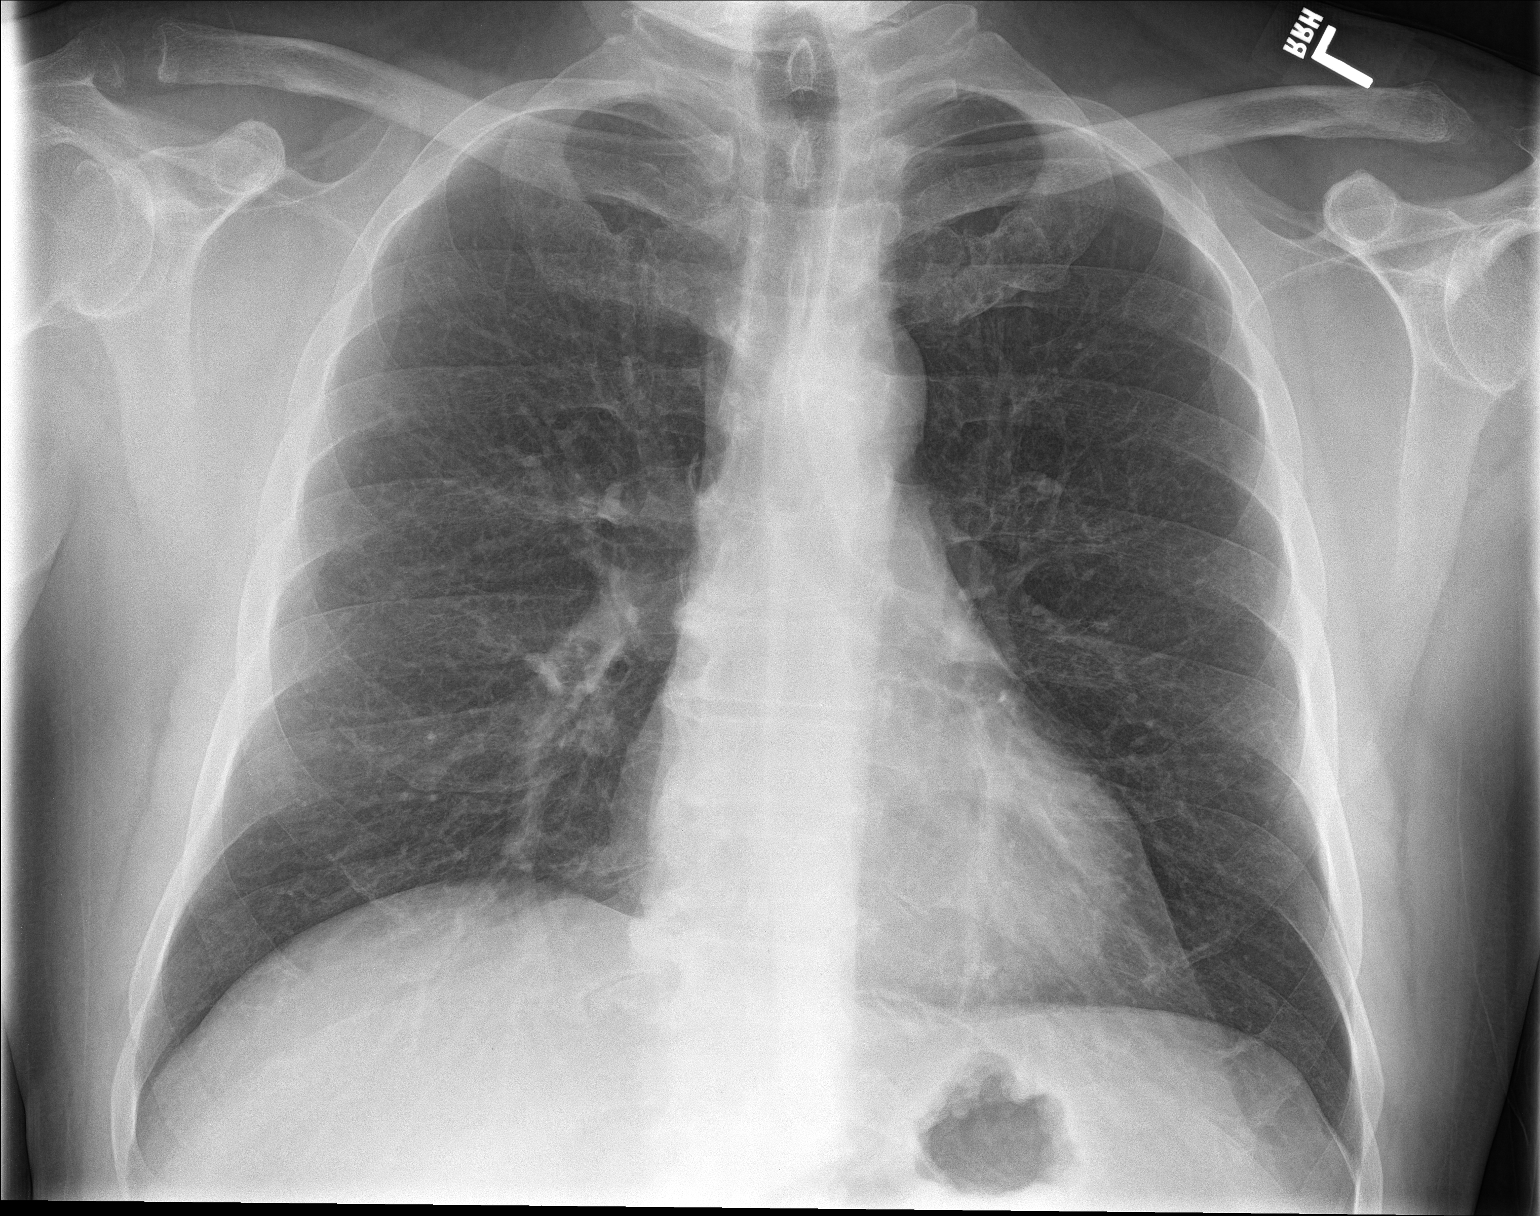

[chest lat]
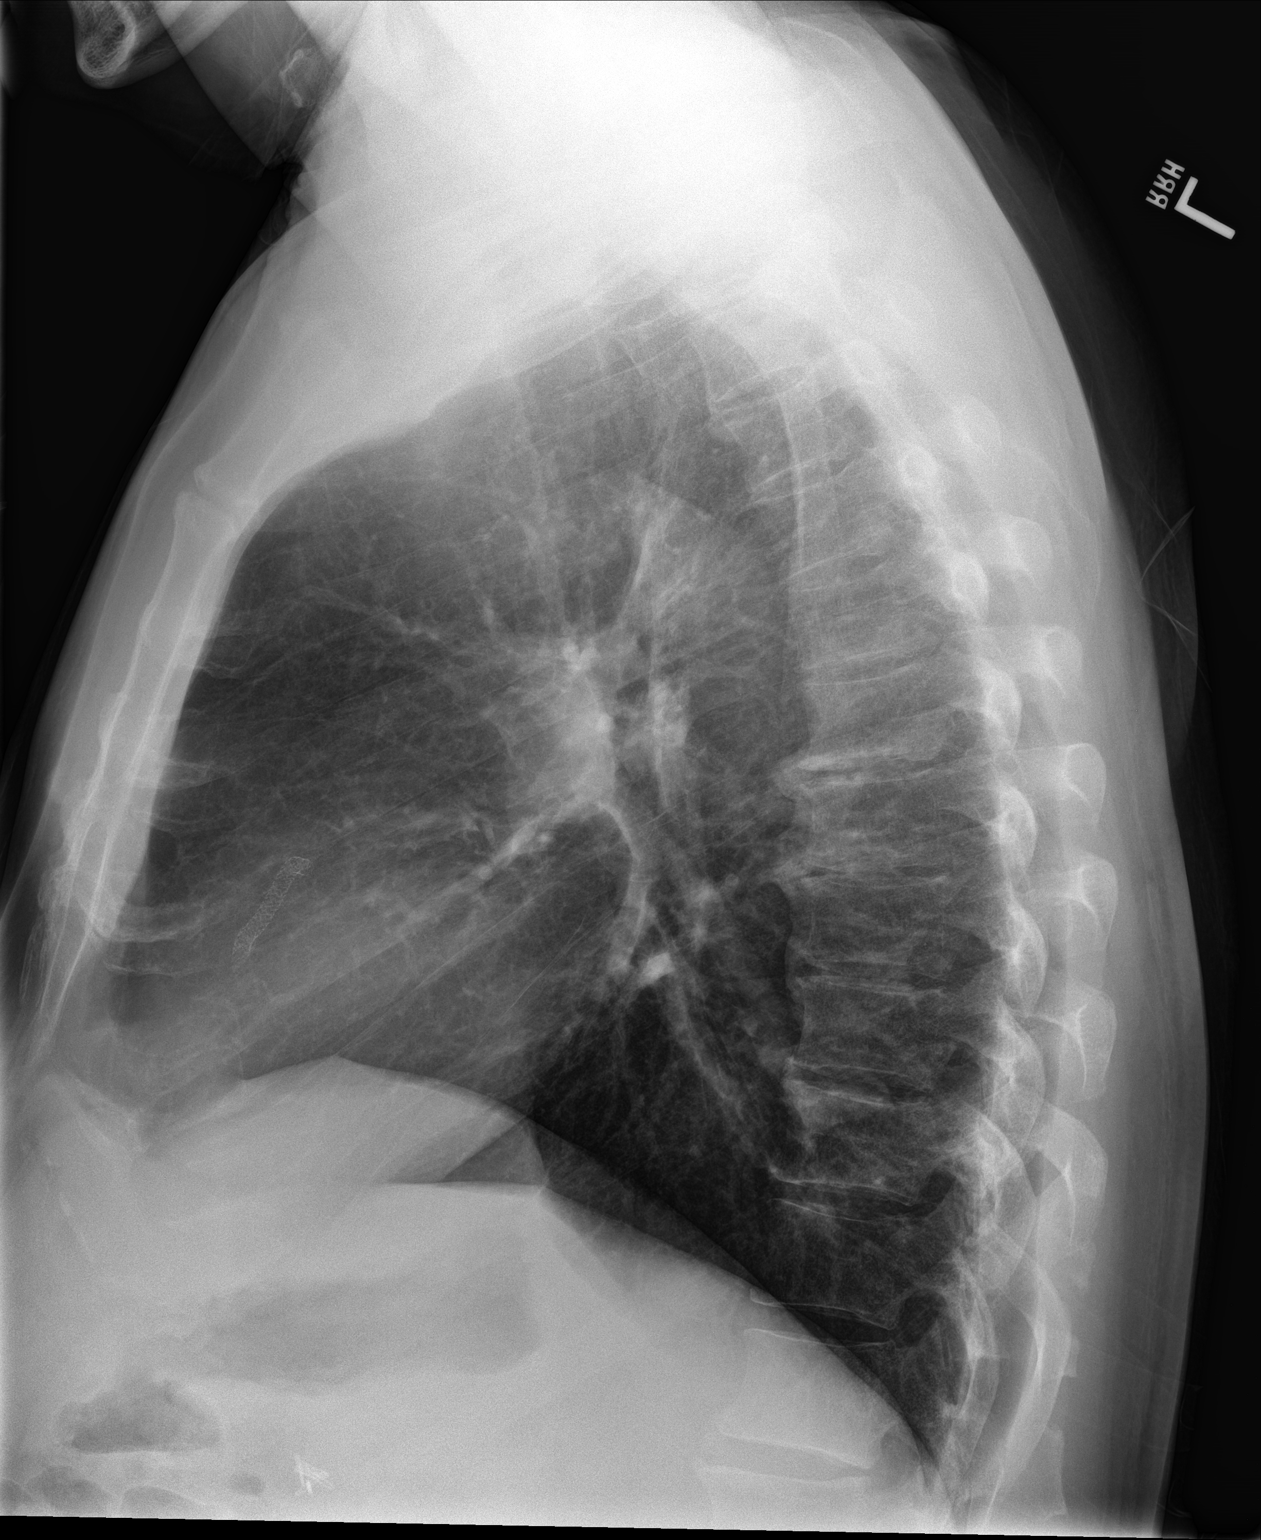

[2 of 2 positions shown; findings below may reference images not displayed]

FINDINGS: The lungs are well-expanded and clear. The heart and pulmonary
vascularity are normal. The mediastinum is normal in width. There is
no pleural effusion. There is multilevel degenerative disc disease
of the thoracic spine. There is chronic widening of the AC joints
greatest on the left.
IMPRESSION: There is no active cardiopulmonary disease.

## 2017-06-15 DIAGNOSIS — E8881 Metabolic syndrome: Secondary | ICD-10-CM | POA: Diagnosis not present

## 2017-06-15 DIAGNOSIS — E782 Mixed hyperlipidemia: Secondary | ICD-10-CM | POA: Diagnosis not present

## 2017-06-15 DIAGNOSIS — E119 Type 2 diabetes mellitus without complications: Secondary | ICD-10-CM | POA: Diagnosis not present

## 2017-06-17 DIAGNOSIS — I10 Essential (primary) hypertension: Secondary | ICD-10-CM | POA: Diagnosis not present

## 2017-06-17 DIAGNOSIS — F17210 Nicotine dependence, cigarettes, uncomplicated: Secondary | ICD-10-CM | POA: Diagnosis not present

## 2017-06-17 DIAGNOSIS — I25111 Atherosclerotic heart disease of native coronary artery with angina pectoris with documented spasm: Secondary | ICD-10-CM | POA: Diagnosis not present

## 2017-06-17 DIAGNOSIS — E119 Type 2 diabetes mellitus without complications: Secondary | ICD-10-CM | POA: Diagnosis not present

## 2017-06-17 DIAGNOSIS — Z6829 Body mass index (BMI) 29.0-29.9, adult: Secondary | ICD-10-CM | POA: Diagnosis not present

## 2017-06-17 DIAGNOSIS — E782 Mixed hyperlipidemia: Secondary | ICD-10-CM | POA: Diagnosis not present

## 2017-06-17 DIAGNOSIS — F331 Major depressive disorder, recurrent, moderate: Secondary | ICD-10-CM | POA: Diagnosis not present

## 2017-06-17 DIAGNOSIS — E8881 Metabolic syndrome: Secondary | ICD-10-CM | POA: Diagnosis not present

## 2017-07-17 DIAGNOSIS — M797 Fibromyalgia: Secondary | ICD-10-CM | POA: Diagnosis not present

## 2017-07-17 DIAGNOSIS — M545 Low back pain: Secondary | ICD-10-CM | POA: Diagnosis not present

## 2017-07-17 DIAGNOSIS — M5136 Other intervertebral disc degeneration, lumbar region: Secondary | ICD-10-CM | POA: Diagnosis not present

## 2017-07-17 DIAGNOSIS — E119 Type 2 diabetes mellitus without complications: Secondary | ICD-10-CM | POA: Diagnosis not present

## 2017-07-17 DIAGNOSIS — Z6829 Body mass index (BMI) 29.0-29.9, adult: Secondary | ICD-10-CM | POA: Diagnosis not present

## 2017-08-05 DIAGNOSIS — M545 Low back pain: Secondary | ICD-10-CM | POA: Diagnosis not present

## 2017-08-05 DIAGNOSIS — G8929 Other chronic pain: Secondary | ICD-10-CM | POA: Diagnosis not present

## 2017-08-05 DIAGNOSIS — Z79899 Other long term (current) drug therapy: Secondary | ICD-10-CM | POA: Diagnosis not present

## 2017-08-11 DIAGNOSIS — M545 Low back pain: Secondary | ICD-10-CM | POA: Diagnosis not present

## 2017-08-11 DIAGNOSIS — M797 Fibromyalgia: Secondary | ICD-10-CM | POA: Diagnosis not present

## 2017-08-11 DIAGNOSIS — Z6829 Body mass index (BMI) 29.0-29.9, adult: Secondary | ICD-10-CM | POA: Diagnosis not present

## 2017-10-02 DIAGNOSIS — M545 Low back pain: Secondary | ICD-10-CM | POA: Diagnosis not present

## 2017-10-02 DIAGNOSIS — G8929 Other chronic pain: Secondary | ICD-10-CM | POA: Diagnosis not present

## 2017-10-02 DIAGNOSIS — Z79899 Other long term (current) drug therapy: Secondary | ICD-10-CM | POA: Diagnosis not present

## 2017-10-26 DIAGNOSIS — M5136 Other intervertebral disc degeneration, lumbar region: Secondary | ICD-10-CM | POA: Diagnosis not present

## 2017-10-26 DIAGNOSIS — Z79899 Other long term (current) drug therapy: Secondary | ICD-10-CM | POA: Diagnosis not present

## 2017-11-10 DIAGNOSIS — Z23 Encounter for immunization: Secondary | ICD-10-CM | POA: Diagnosis not present

## 2017-11-10 DIAGNOSIS — Z6828 Body mass index (BMI) 28.0-28.9, adult: Secondary | ICD-10-CM | POA: Diagnosis not present

## 2017-11-10 DIAGNOSIS — K219 Gastro-esophageal reflux disease without esophagitis: Secondary | ICD-10-CM | POA: Diagnosis not present

## 2017-11-10 DIAGNOSIS — I10 Essential (primary) hypertension: Secondary | ICD-10-CM | POA: Diagnosis not present

## 2017-11-10 DIAGNOSIS — F17210 Nicotine dependence, cigarettes, uncomplicated: Secondary | ICD-10-CM | POA: Diagnosis not present

## 2017-11-10 DIAGNOSIS — E782 Mixed hyperlipidemia: Secondary | ICD-10-CM | POA: Diagnosis not present

## 2017-11-10 DIAGNOSIS — M545 Low back pain: Secondary | ICD-10-CM | POA: Diagnosis not present

## 2017-11-10 DIAGNOSIS — M5136 Other intervertebral disc degeneration, lumbar region: Secondary | ICD-10-CM | POA: Diagnosis not present

## 2017-11-10 DIAGNOSIS — E8881 Metabolic syndrome: Secondary | ICD-10-CM | POA: Diagnosis not present

## 2017-11-10 DIAGNOSIS — I25111 Atherosclerotic heart disease of native coronary artery with angina pectoris with documented spasm: Secondary | ICD-10-CM | POA: Diagnosis not present

## 2017-11-10 DIAGNOSIS — F331 Major depressive disorder, recurrent, moderate: Secondary | ICD-10-CM | POA: Diagnosis not present

## 2017-11-10 DIAGNOSIS — M797 Fibromyalgia: Secondary | ICD-10-CM | POA: Diagnosis not present

## 2017-11-10 DIAGNOSIS — E119 Type 2 diabetes mellitus without complications: Secondary | ICD-10-CM | POA: Diagnosis not present

## 2017-11-10 DIAGNOSIS — J301 Allergic rhinitis due to pollen: Secondary | ICD-10-CM | POA: Diagnosis not present

## 2017-11-25 DIAGNOSIS — M5136 Other intervertebral disc degeneration, lumbar region: Secondary | ICD-10-CM | POA: Diagnosis not present

## 2017-11-25 DIAGNOSIS — M545 Low back pain: Secondary | ICD-10-CM | POA: Diagnosis not present

## 2017-11-25 DIAGNOSIS — Z79899 Other long term (current) drug therapy: Secondary | ICD-10-CM | POA: Diagnosis not present

## 2017-11-25 DIAGNOSIS — G8929 Other chronic pain: Secondary | ICD-10-CM | POA: Diagnosis not present

## 2017-11-26 DIAGNOSIS — M549 Dorsalgia, unspecified: Secondary | ICD-10-CM | POA: Diagnosis not present

## 2017-11-26 DIAGNOSIS — G8929 Other chronic pain: Secondary | ICD-10-CM | POA: Diagnosis not present

## 2017-11-27 DIAGNOSIS — M545 Low back pain: Secondary | ICD-10-CM | POA: Diagnosis not present

## 2017-11-27 DIAGNOSIS — E119 Type 2 diabetes mellitus without complications: Secondary | ICD-10-CM | POA: Diagnosis not present

## 2017-11-27 DIAGNOSIS — G8929 Other chronic pain: Secondary | ICD-10-CM | POA: Diagnosis not present

## 2017-11-27 DIAGNOSIS — M1990 Unspecified osteoarthritis, unspecified site: Secondary | ICD-10-CM | POA: Diagnosis not present

## 2017-11-27 DIAGNOSIS — I2510 Atherosclerotic heart disease of native coronary artery without angina pectoris: Secondary | ICD-10-CM | POA: Diagnosis not present

## 2017-11-27 DIAGNOSIS — I252 Old myocardial infarction: Secondary | ICD-10-CM | POA: Diagnosis not present

## 2017-11-27 DIAGNOSIS — Z7982 Long term (current) use of aspirin: Secondary | ICD-10-CM | POA: Diagnosis not present

## 2017-11-27 DIAGNOSIS — Z79899 Other long term (current) drug therapy: Secondary | ICD-10-CM | POA: Diagnosis not present

## 2017-11-27 DIAGNOSIS — F17200 Nicotine dependence, unspecified, uncomplicated: Secondary | ICD-10-CM | POA: Diagnosis not present

## 2017-11-27 DIAGNOSIS — Z955 Presence of coronary angioplasty implant and graft: Secondary | ICD-10-CM | POA: Diagnosis not present

## 2017-11-27 DIAGNOSIS — M549 Dorsalgia, unspecified: Secondary | ICD-10-CM | POA: Diagnosis not present

## 2017-11-27 DIAGNOSIS — I10 Essential (primary) hypertension: Secondary | ICD-10-CM | POA: Diagnosis not present

## 2017-11-27 DIAGNOSIS — K219 Gastro-esophageal reflux disease without esophagitis: Secondary | ICD-10-CM | POA: Diagnosis not present

## 2017-11-27 DIAGNOSIS — Z7984 Long term (current) use of oral hypoglycemic drugs: Secondary | ICD-10-CM | POA: Diagnosis not present

## 2017-11-27 DIAGNOSIS — Z7951 Long term (current) use of inhaled steroids: Secondary | ICD-10-CM | POA: Diagnosis not present

## 2017-11-27 DIAGNOSIS — M797 Fibromyalgia: Secondary | ICD-10-CM | POA: Diagnosis not present

## 2017-11-27 DIAGNOSIS — Z9049 Acquired absence of other specified parts of digestive tract: Secondary | ICD-10-CM | POA: Diagnosis not present

## 2017-12-16 DIAGNOSIS — M4807 Spinal stenosis, lumbosacral region: Secondary | ICD-10-CM | POA: Diagnosis not present

## 2017-12-16 DIAGNOSIS — M5126 Other intervertebral disc displacement, lumbar region: Secondary | ICD-10-CM | POA: Diagnosis not present

## 2017-12-16 DIAGNOSIS — M545 Low back pain: Secondary | ICD-10-CM | POA: Diagnosis not present

## 2017-12-16 DIAGNOSIS — M5127 Other intervertebral disc displacement, lumbosacral region: Secondary | ICD-10-CM | POA: Diagnosis not present

## 2017-12-16 DIAGNOSIS — M5136 Other intervertebral disc degeneration, lumbar region: Secondary | ICD-10-CM | POA: Diagnosis not present

## 2017-12-29 DIAGNOSIS — G8929 Other chronic pain: Secondary | ICD-10-CM | POA: Diagnosis not present

## 2017-12-29 DIAGNOSIS — M5136 Other intervertebral disc degeneration, lumbar region: Secondary | ICD-10-CM | POA: Diagnosis not present

## 2017-12-29 DIAGNOSIS — M545 Low back pain: Secondary | ICD-10-CM | POA: Diagnosis not present

## 2017-12-29 DIAGNOSIS — Z79899 Other long term (current) drug therapy: Secondary | ICD-10-CM | POA: Diagnosis not present

## 2018-01-13 ENCOUNTER — Encounter: Payer: Self-pay | Admitting: *Deleted

## 2018-01-14 ENCOUNTER — Encounter: Payer: Self-pay | Admitting: Neurology

## 2018-01-14 ENCOUNTER — Encounter (INDEPENDENT_AMBULATORY_CARE_PROVIDER_SITE_OTHER): Payer: Self-pay

## 2018-01-14 ENCOUNTER — Ambulatory Visit (INDEPENDENT_AMBULATORY_CARE_PROVIDER_SITE_OTHER): Payer: Medicare Other | Admitting: Neurology

## 2018-01-14 VITALS — BP 130/84 | HR 82 | Ht 70.0 in | Wt 207.0 lb

## 2018-01-14 DIAGNOSIS — G44221 Chronic tension-type headache, intractable: Secondary | ICD-10-CM | POA: Diagnosis not present

## 2018-01-14 DIAGNOSIS — R519 Headache, unspecified: Secondary | ICD-10-CM

## 2018-01-14 DIAGNOSIS — R51 Headache: Secondary | ICD-10-CM | POA: Diagnosis not present

## 2018-01-14 DIAGNOSIS — H539 Unspecified visual disturbance: Secondary | ICD-10-CM | POA: Diagnosis not present

## 2018-01-14 MED ORDER — AMITRIPTYLINE HCL 25 MG PO TABS: ORAL_TABLET | ORAL | 3 refills | Status: DC

## 2018-01-14 NOTE — Patient Instructions (Addendum)
Amitriptyline 25mg (1 pill) at bedtime. In one-two weeks if no side effects increase to 50mg  at bedtime (2 pills)   Tension Headache A tension headache is a feeling of pain, pressure, or aching that is often felt over the front and sides of the head. The pain can be dull, or it can feel tight (constricting). Tension headaches are not normally associated with nausea or vomiting, and they do not get worse with physical activity. Tension headaches can last from 30 minutes to several days. This is the most common type of headache. CAUSES The exact cause of this condition is not known. Tension headaches often begin after stress, anxiety, or depression. Other triggers may include:  Alcohol.  Too much caffeine, or caffeine withdrawal.  Respiratory infections, such as colds, flu, or sinus infections.  Dental problems or teeth clenching.  Fatigue.  Holding your head and neck in the same position for a long period of time, such as while using a computer.  Smoking. SYMPTOMS Symptoms of this condition include:  A feeling of pressure around the head.  Dull, aching head pain.  Pain felt over the front and sides of the head.  Tenderness in the muscles of the head, neck, and shoulders. DIAGNOSIS This condition may be diagnosed based on your symptoms and a physical exam. Tests may be done, such as a CT scan or an MRI of your head. These tests may be done if your symptoms are severe or unusual. TREATMENT This condition may be treated with lifestyle changes and medicines to help relieve symptoms. HOME CARE INSTRUCTIONS Managing Pain  Take over-the-counter and prescription medicines only as told by your health care provider.  Lie down in a dark, quiet room when you have a headache.  If directed, apply ice to the head and neck area: ? Put ice in a plastic bag. ? Place a towel between your skin and the bag. ? Leave the ice on for 20 minutes, 2-3 times per day.  Use a heating pad or a hot  shower to apply heat to the head and neck area as told by your health care provider. Eating and Drinking  Eat meals on a regular schedule.  Limit alcohol use.  Decrease your caffeine intake, or stop using caffeine. General Instructions  Keep all follow-up visits as told by your health care provider. This is important.  Keep a headache journal to help find out what may trigger your headaches. For example, write down: ? What you eat and drink. ? How much sleep you get. ? Any change to your diet or medicines.  Try massage or other relaxation techniques.  Limit stress.  Sit up straight, and avoid tensing your muscles.  Do not use tobacco products, including cigarettes, chewing tobacco, or e-cigarettes. If you need help quitting, ask your health care provider.  Exercise regularly as told by your health care provider.  Get 7-9 hours of sleep, or the amount recommended by your health care provider. SEEK MEDICAL CARE IF:  Your symptoms are not helped by medicine.  You have a headache that is different from what you normally experience.  You have nausea or you vomit.  You have a fever. SEEK IMMEDIATE MEDICAL CARE IF:  Your headache becomes severe.  You have repeated vomiting.  You have a stiff neck.  You have a loss of vision.  You have problems with speech.  You have pain in your eye or ear.  You have muscular weakness or loss of muscle control.  You lose  your balance or you have trouble walking.  You feel faint or you pass out.  You have confusion. This information is not intended to replace advice given to you by your health care provider. Make sure you discuss any questions you have with your health care provider. Document Released: 11/24/2005 Document Revised: 08/15/2015 Document Reviewed: 03/19/2015 Elsevier Interactive Patient Education  2017 [employer]  Amitriptyline tablets What is this medicine? AMITRIPTYLINE (a mee TRIP ti leen) is used to treat  depression. This medicine may be used for other purposes; ask your health care provider or pharmacist if you have questions. COMMON BRAND NAME(S): Elavil, Vanatrip What should I tell my health care provider before I take this medicine? They need to know if you have any of these conditions: -an alcohol problem -asthma, difficulty breathing -bipolar disorder or schizophrenia -difficulty passing urine, prostate trouble -glaucoma -heart disease or previous heart attack -liver disease -over active thyroid -seizures -thoughts or plans of suicide, a previous suicide attempt, or family history of suicide attempt -an unusual or allergic reaction to amitriptyline, other medicines, foods, dyes, or preservatives -pregnant or trying to get pregnant -breast-feeding How should I use this medicine? Take this medicine by mouth with a drink of water. Follow the directions on the prescription label. You can take the tablets with or without food. Take your medicine at regular intervals. Do not take it more often than directed. Do not stop taking this medicine suddenly except upon the advice of your doctor. Stopping this medicine too quickly may cause serious side effects or your condition may worsen. A special MedGuide will be given to you by the pharmacist with each prescription and refill. Be sure to read this information carefully each time. Talk to your pediatrician regarding the use of this medicine in children. Special care may be needed. Overdosage: If you think you have taken too much of this medicine contact a poison control center or emergency room at once. NOTE: This medicine is only for you. Do not share this medicine with others. What if I miss a dose? If you miss a dose, take it as soon as you can. If it is almost time for your next dose, take only that dose. Do not take double or extra doses. What may interact with this medicine? Do not take this medicine with any of the following  medications: -arsenic trioxide -certain medicines used to regulate abnormal heartbeat or to treat other heart conditions -cisapride -droperidol -halofantrine -linezolid -MAOIs like Carbex, Eldepryl, Marplan, Nardil, and Parnate -methylene blue -other medicines for mental depression -phenothiazines like perphenazine, thioridazine and chlorpromazine -pimozide -probucol -procarbazine -sparfloxacin -St. John's Wort -ziprasidone This medicine may also interact with the following medications: -atropine and related drugs like hyoscyamine, scopolamine, tolterodine and others -barbiturate medicines for inducing sleep or treating seizures, like phenobarbital -cimetidine -disulfiram -ethchlorvynol -thyroid hormones such as levothyroxine This list may not describe all possible interactions. Give your health care provider a list of all the medicines, herbs, non-prescription drugs, or dietary supplements you use. Also tell them if you smoke, drink alcohol, or use illegal drugs. Some items may interact with your medicine. What should I watch for while using this medicine? Tell your doctor if your symptoms do not get better or if they get worse. Visit your doctor or health care professional for regular checks on your progress. Because it may take several weeks to see the full effects of this medicine, it is important to continue your treatment as prescribed by your doctor. Patients and  their families should watch out for new or worsening thoughts of suicide or depression. Also watch out for sudden changes in feelings such as feeling anxious, agitated, panicky, irritable, hostile, aggressive, impulsive, severely restless, overly excited and hyperactive, or not being able to sleep. If this happens, especially at the beginning of treatment or after a change in dose, call your health care professional. Bonita Quin may get drowsy or dizzy. Do not drive, use machinery, or do anything that needs mental alertness until  you know how this medicine affects you. Do not stand or sit up quickly, especially if you are an older patient. This reduces the risk of dizzy or fainting spells. Alcohol may interfere with the effect of this medicine. Avoid alcoholic drinks. Do not treat yourself for coughs, colds, or allergies without asking your doctor or health care professional for advice. Some ingredients can increase possible side effects. Your mouth may get dry. Chewing sugarless gum or sucking hard candy, and drinking plenty of water will help. Contact your doctor if the problem does not go away or is severe. This medicine may cause dry eyes and blurred vision. If you wear contact lenses you may feel some discomfort. Lubricating drops may help. See your eye doctor if the problem does not go away or is severe. This medicine can cause constipation. Try to have a bowel movement at least every 2 to 3 days. If you do not have a bowel movement for 3 days, call your doctor or health care professional. This medicine can make you more sensitive to the sun. Keep out of the sun. If you cannot avoid being in the sun, wear protective clothing and use sunscreen. Do not use sun lamps or tanning beds/booths. What side effects may I notice from receiving this medicine? Side effects that you should report to your doctor or health care professional as soon as possible: -allergic reactions like skin rash, itching or hives, swelling of the face, lips, or tongue -anxious -breathing problems -changes in vision -confusion -elevated mood, decreased need for sleep, racing thoughts, impulsive behavior -eye pain -fast, irregular heartbeat -feeling faint or lightheaded, falls -feeling agitated, angry, or irritable -fever with increased sweating -hallucination, loss of contact with reality -seizures -stiff muscles -suicidal thoughts or other mood changes -tingling, pain, or numbness in the feet or hands -trouble passing urine or change in the  amount of urine -trouble sleeping -unusually weak or tired -vomiting -yellowing of the eyes or skin Side effects that usually do not require medical attention (report to your doctor or health care professional if they continue or are bothersome): -change in sex drive or performance -change in appetite or weight -constipation -dizziness -dry mouth -nausea -tired -tremors -upset stomach This list may not describe all possible side effects. Call your doctor for medical advice about side effects. You may report side effects to FDA at 1-800-FDA-1088. Where should I keep my medicine? Keep out of the reach of children. Store at room temperature between 20 and 25 degrees C (68 and 77 degrees F). Throw away any unused medicine after the expiration date. NOTE: This sheet is a summary. It may not cover all possible information. If you have questions about this medicine, talk to your doctor, pharmacist, or health care provider.  2018 Elsevier/Gold Standard (2016-04-25 12:14:15)  Amitriptyline tablets What is this medicine? AMITRIPTYLINE (a mee TRIP ti leen) is used to treat depression. This medicine may be used for other purposes; ask your health care provider or pharmacist if you have questions. COMMON  BRAND NAME(S): Elavil, Vanatrip What should I tell my health care provider before I take this medicine? They need to know if you have any of these conditions: -an alcohol problem -asthma, difficulty breathing -bipolar disorder or schizophrenia -difficulty passing urine, prostate trouble -glaucoma -heart disease or previous heart attack -liver disease -over active thyroid -seizures -thoughts or plans of suicide, a previous suicide attempt, or family history of suicide attempt -an unusual or allergic reaction to amitriptyline, other medicines, foods, dyes, or preservatives -pregnant or trying to get pregnant -breast-feeding How should I use this medicine? Take this medicine by mouth with  a drink of water. Follow the directions on the prescription label. You can take the tablets with or without food. Take your medicine at regular intervals. Do not take it more often than directed. Do not stop taking this medicine suddenly except upon the advice of your doctor. Stopping this medicine too quickly may cause serious side effects or your condition may worsen. A special MedGuide will be given to you by the pharmacist with each prescription and refill. Be sure to read this information carefully each time. Talk to your pediatrician regarding the use of this medicine in children. Special care may be needed. Overdosage: If you think you have taken too much of this medicine contact a poison control center or emergency room at once. NOTE: This medicine is only for you. Do not share this medicine with others. What if I miss a dose? If you miss a dose, take it as soon as you can. If it is almost time for your next dose, take only that dose. Do not take double or extra doses. What may interact with this medicine? Do not take this medicine with any of the following medications: -arsenic trioxide -certain medicines used to regulate abnormal heartbeat or to treat other heart conditions -cisapride -droperidol -halofantrine -linezolid -MAOIs like Carbex, Eldepryl, Marplan, Nardil, and Parnate -methylene blue -other medicines for mental depression -phenothiazines like perphenazine, thioridazine and chlorpromazine -pimozide -probucol -procarbazine -sparfloxacin -St. John's Wort -ziprasidone This medicine may also interact with the following medications: -atropine and related drugs like hyoscyamine, scopolamine, tolterodine and others -barbiturate medicines for inducing sleep or treating seizures, like phenobarbital -cimetidine -disulfiram -ethchlorvynol -thyroid hormones such as levothyroxine This list may not describe all possible interactions. Give your health care provider a list of all  the medicines, herbs, non-prescription drugs, or dietary supplements you use. Also tell them if you smoke, drink alcohol, or use illegal drugs. Some items may interact with your medicine. What should I watch for while using this medicine? Tell your doctor if your symptoms do not get better or if they get worse. Visit your doctor or health care professional for regular checks on your progress. Because it may take several weeks to see the full effects of this medicine, it is important to continue your treatment as prescribed by your doctor. Patients and their families should watch out for new or worsening thoughts of suicide or depression. Also watch out for sudden changes in feelings such as feeling anxious, agitated, panicky, irritable, hostile, aggressive, impulsive, severely restless, overly excited and hyperactive, or not being able to sleep. If this happens, especially at the beginning of treatment or after a change in dose, call your health care professional. Bonita QuinYou may get drowsy or dizzy. Do not drive, use machinery, or do anything that needs mental alertness until you know how this medicine affects you. Do not stand or sit up quickly, especially if you are an older  patient. This reduces the risk of dizzy or fainting spells. Alcohol may interfere with the effect of this medicine. Avoid alcoholic drinks. Do not treat yourself for coughs, colds, or allergies without asking your doctor or health care professional for advice. Some ingredients can increase possible side effects. Your mouth may get dry. Chewing sugarless gum or sucking hard candy, and drinking plenty of water will help. Contact your doctor if the problem does not go away or is severe. This medicine may cause dry eyes and blurred vision. If you wear contact lenses you may feel some discomfort. Lubricating drops may help. See your eye doctor if the problem does not go away or is severe. This medicine can cause constipation. Try to have a bowel  movement at least every 2 to 3 days. If you do not have a bowel movement for 3 days, call your doctor or health care professional. This medicine can make you more sensitive to the sun. Keep out of the sun. If you cannot avoid being in the sun, wear protective clothing and use sunscreen. Do not use sun lamps or tanning beds/booths. What side effects may I notice from receiving this medicine? Side effects that you should report to your doctor or health care professional as soon as possible: -allergic reactions like skin rash, itching or hives, swelling of the face, lips, or tongue -anxious -breathing problems -changes in vision -confusion -elevated mood, decreased need for sleep, racing thoughts, impulsive behavior -eye pain -fast, irregular heartbeat -feeling faint or lightheaded, falls -feeling agitated, angry, or irritable -fever with increased sweating -hallucination, loss of contact with reality -seizures -stiff muscles -suicidal thoughts or other mood changes -tingling, pain, or numbness in the feet or hands -trouble passing urine or change in the amount of urine -trouble sleeping -unusually weak or tired -vomiting -yellowing of the eyes or skin Side effects that usually do not require medical attention (report to your doctor or health care professional if they continue or are bothersome): -change in sex drive or performance -change in appetite or weight -constipation -dizziness -dry mouth -nausea -tired -tremors -upset stomach This list may not describe all possible side effects. Call your doctor for medical advice about side effects. You may report side effects to FDA at 1-800-FDA-1088. Where should I keep my medicine? Keep out of the reach of children. Store at room temperature between 20 and 25 degrees C (68 and 77 degrees F). Throw away any unused medicine after the expiration date. NOTE: This sheet is a summary. It may not cover all possible information. If you have  questions about this medicine, talk to your doctor, pharmacist, or health care provider.  2018 Elsevier/Gold Standard (2016-04-25 12:14:15)

## 2018-01-14 NOTE — Progress Notes (Signed)
[facility]    Provider:  Dr Lucia Gaskins Referring Provider: Richardean Chimera, MD Primary Care Physician:  Richardean Chimera, MD  CC:  headache  HPI:  Howard Nettles Sr. is a 57 y.o. male here as a referral from Dr. Reuel Boom for headache.  Patient has a past medical history of coronary artery disease, diabetes, hypertension, hyperlipidemia, active tobacco abuse and fibromyalgia, anxiety, depression, low back pain, irritable bowel syndrome.  He is on chronic opioid therapy for his fibromyalgia. He has had headaches for a little over a year, 15-16 months, at that time he went to pain management and they said he was not taking his medication. He got another pain management and then a third pain manager who he is seeing now. He has pain right across the top of the head. Bilaterally in the forehead. Can be severe. Continuous all day long. Laying down helps. Light hurts him. No nausea or vomiting. No migraines. He does not snore. Most of the time he does not wake up with headaches but they start afterwards. He stopped all medications for 2 months and this did not help. He has blurry vision and vision changes. Headache can be worse when bending over. No other focal neurologic deficits, associated symptoms, inciting events or modifiable factors.  He has info on stent from [facility] 2007, he had another stent at [facility] a few years ago  Reviewed notes, labs and imaging from outside physicians, which showed:  Reviewed CT of the head report I do not have the images this was compared with one previously completed in November 2011, exam was completed October 2018, unremarkable report without acute intracranial abnormality or any stated abnormality.  tsh normal  hgba1c 7.8 01/2017  Reviewed primary care notes.  Patient has not been checking his sugar.  He has been noncompliant with medications.  Metformin caused diarrhea.  He reports that he hurts all the time.  His pain is under good control it, his  fibromyalgia pain, which is chronic.  He is going to a pain clinic.  The complaint moderately limits activities unable to work.  He has pain daily in his legs and elbows.  The pain is bad in his elbows at times.  Episodes occur in the afternoon he is stable on oxycodone and his bowels are working okay.  Important triggers include activity.  He feels better if he sleeps better.  Symptom is exacerbated by exertion.  He has daily pain in his pain medications help.  He is also a chronic active cigarette smoker.  His examination was overall unremarkable, he is overweight.  Review of Systems: Patient complains of symptoms per HPI as well as the following symptoms: Fatigue, blurred vision, shortness of breath, feeling hot, joint pain, aching muscles, headache, weakness, dizziness, insomnia, sleepiness, depression, migraine, fibromyalgia, diabetes, high cholesterol, heart disease. Pertinent negatives and positives per HPI. All others negative.   Social History   Socioeconomic History  . Marital status: Married    Spouse name: Not on file  . Number of children: 3  . Years of education: 10  . Highest education level: Not on file  Social Needs  . Financial resource strain: Not on file  . Food insecurity - worry: Not on file  . Food insecurity - inability: Not on file  . Transportation needs - medical: Not on file  . Transportation needs - non-medical: Not on file  Occupational History  . Not on file  Tobacco Use  . Smoking status: Current Every  Day Smoker    Packs/day: 1.50    Types: Cigarettes    Start date: 12/01/1972  . Smokeless tobacco: Never Used  . Tobacco comment: 1.5-2 packs per day  Substance and Sexual Activity  . Alcohol use: No    Alcohol/week: 0.0 oz    Frequency: Never  . Drug use: No  . Sexual activity: Not on file  Other Topics Concern  . Not on file  Social History Narrative   Lives at home with his wife & 2 sons   Right handed   No caffeine    Family History    Problem Relation Age of Onset  . Coronary artery disease Mother   . Coronary artery disease Father   . Diabetes Father     Past Medical History:  Diagnosis Date  . Chest pain   . Chronic low back pain   . Coronary atherosclerosis of native coronary artery 2009   stent  . Depression   . Diabetes mellitus (HCC)   . Dyslipidemia   . Fibromyalgia   . GERD (gastroesophageal reflux disease)   . Headache   . Heart attack (HCC)    three times  . HTN (hypertension)   . NSTEMI (non-ST elevated myocardial infarction) [facility](HCC)     Past Surgical History:  Procedure Laterality Date  . APPENDECTOMY    . CARDIAC CATHETERIZATION     [phone],2009  . CHOLECYSTECTOMY  2009  . CORONARY STENT PLACEMENT    . KNEE SURGERY Right   . LEFT HEART CATH AND CORONARY ANGIOGRAPHY N/A 01/30/2017   Procedure: Left Heart Cath and Coronary Angiography;  Surgeon: Yvonne Kendallhristopher End, MD;  [facility];  Service: Cardiovascular;  Laterality: N/A;  . NASAL SINUS SURGERY    . SHOULDER SURGERY     [phone],2005  . SHOULDER SURGERY     R & L    Current Outpatient Medications  Medication Sig Dispense Refill  . Calcium-Magnesium-Zinc (CAL-MAG-ZINC PO) Take 2 tablets by mouth at bedtime.     . DULoxetine (CYMBALTA) 60 MG capsule Take 60 mg by mouth at bedtime.     . fluticasone (FLONASE) 50 MCG/ACT nasal spray Place 2-3 sprays into both nostrils daily as needed (congestion).     . gabapentin (NEURONTIN) 400 MG capsule Take 400 mg by mouth 3 (three) times daily.    Marland Kitchen. glipiZIDE (GLUCOTROL XL) 10 MG 24 hr tablet Take 10 mg by mouth at bedtime.    Marland Kitchen. lisinopril (PRINIVIL,ZESTRIL) 2.5 MG tablet Take 2.5 mg by mouth at bedtime.     . metoprolol succinate (TOPROL-XL) 25 MG 24 hr tablet Take 25 mg by mouth at bedtime.     . NUCYNTA 50 MG tablet Take 50 mg by mouth every 8 (eight) hours as needed.    Marland Kitchen. omeprazole (PRILOSEC) 20 MG capsule Take 20 mg by mouth 2 (two) times daily as needed (acid reflux).     .  pravastatin (PRAVACHOL) 40 MG tablet Take 1 tablet (40 mg total) by mouth every evening. 90 tablet 3  . amitriptyline (ELAVIL) 25 MG tablet Start 25mg (1 pill) at bedtime. In one-two weeks if no side effects increase to 50mg  at bedtime (2 pills) 60 tablet 3  . nitroGLYCERIN (NITROSTAT) 0.4 MG SL tablet Place 0.4 mg under the tongue every 5 (five) minutes as needed for chest pain.     Marland Kitchen. oxycodone (ROXICODONE) 30 MG immediate release tablet Take 30 mg by mouth every 4 (four) hours. Scheduled per RX  No current facility-administered medications for this visit.     Allergies as of 01/14/2018 - Review Complete 01/14/2018  Allergen Reaction Noted  . Ampicillin Other (See Comments)   . Other  01/13/2018  . Penicillins Diarrhea 01/13/2018    Vitals: BP 130/84 (BP Location: Right Arm, Patient Position: Sitting)   Pulse 82   Ht 5\' 10"  (1.778 m)   Wt 207 lb (93.9 kg)   BMI 29.70 kg/m  Last Weight:  Wt Readings from Last 1 Encounters:  01/14/18 207 lb (93.9 kg)   Last Height:   Ht Readings from Last 1 Encounters:  01/14/18 5\' 10"  (1.778 m)    Physical exam: Exam: Gen: NAD, conversant, well nourised, obese, well groomed                     CV: RRR, no MRG. No Carotid Bruits. No peripheral edema, warm, nontender Eyes: Conjunctivae clear without exudates or hemorrhage  Neuro: Detailed Neurologic Exam  Speech:    Speech is normal; fluent and spontaneous with normal comprehension.  Cognition:    The patient is oriented to person, place, and time;     recent and remote memory intact;     language fluent;     normal attention, concentration,     fund of knowledge Cranial Nerves:    The pupils are equal, round, and reactive to light. The fundi are normal and spontaneous venous pulsations are present. Visual fields are full to finger confrontation. Extraocular movements are intact. Trigeminal sensation is intact and the muscles of mastication are normal. The face is symmetric. The  palate elevates in the midline. Hearing intact. Voice is normal. Shoulder shrug is normal. The tongue has normal motion without fasciculations.   Coordination:    Normal finger to nose and heel to shin. Normal rapid alternating movements.   Gait:    Heel-toe and tandem gait are normal.   Motor Observation:    No asymmetry, no atrophy, and no involuntary movements noted. Tone:    Normal muscle tone.    Posture:    Posture is normal. normal erect    Strength:    Strength is V/V in the upper and lower limbs.      Sensation: intact to LT     Reflex Exam:  DTR's:    Deep tendon reflexes in the upper and lower extremities are normal bilaterally.   Toes:    The toes are downgoing bilaterally.   Clonus:    Clonus is absent.      Assessment/Plan:  Patient with new onset headache after the age of 71.   - Needs MRi brain, new onset headache after 48 to eval for space occupying mass or other lesion such as chiari malformation or intracranial HTN especially given positional quality and vision changes.   - Amitriptyline 25mg (1 pill) at bedtime. In one-two weeks if no side effects increase to 50mg  at bedtime (2 pills). Last qtc normal (reviewed EKG tracing)  - Medication overuse may be playing a role, discussed with him   Orders Placed This Encounter  Procedures  . MR BRAIN W WO CONTRAST  . Sedimentation rate  . C-reactive protein  . Basic Metabolic Panel   Discussed: To prevent or relieve headaches, try the following: Cool Compress. Lie down and place a cool compress on your head.  Avoid headache triggers. If certain foods or odors seem to have triggered your migraines in the past, avoid them. A headache diary might help you identify  triggers.  Include physical activity in your daily routine. Try a daily walk or other moderate aerobic exercise.  Manage stress. Find healthy ways to cope with the stressors, such as delegating tasks on your to-do list.  Practice relaxation  techniques. Try deep breathing, yoga, massage and visualization.  Eat regularly. Eating regularly scheduled meals and maintaining a healthy diet might help prevent headaches. Also, drink plenty of fluids.  Follow a regular sleep schedule. Sleep deprivation might contribute to headaches Consider biofeedback. With this mind-body technique, you learn to control certain bodily functions - such as muscle tension, heart rate and blood pressure - to prevent headaches or reduce headache pain.    Proceed to emergency room if you experience new or worsening symptoms or symptoms do not resolve, if you have new neurologic symptoms or if headache is severe, or for any concerning symptom.   Provided education and documentation from American headache Society toolbox including articles on: chronic migraine medication overuse headache, chronic migraines, prevention of migraines, behavioral and other nonpharmacologic treatments for headache.    Naomie Dean, MD  [facility] [address] [place], [place] [address]  Phone [phone] Fax [phone]

## 2018-01-15 LAB — BASIC METABOLIC PANEL
BUN / CREAT RATIO: 14 (ref 9–20)
BUN: 12 mg/dL (ref 6–24)
CO2: 23 mmol/L (ref 20–29)
CREATININE: 0.85 mg/dL (ref 0.76–1.27)
Calcium: 9 mg/dL (ref 8.7–10.2)
Chloride: 106 mmol/L (ref 96–106)
GFR calc Af Amer: 111 mL/min/{1.73_m2} (ref >59)
GFR, EST NON AFRICAN AMERICAN: 96 mL/min/{1.73_m2} (ref >59)
GLUCOSE: 134 mg/dL — AB (ref 65–99)
Potassium: 4.1 mmol/L (ref 3.5–5.2)
SODIUM: 145 mmol/L — AB (ref 134–144)

## 2018-01-15 LAB — C-REACTIVE PROTEIN: CRP: 0.6 mg/L (ref 0.0–4.9)

## 2018-01-15 LAB — SEDIMENTATION RATE: Sed Rate: 3 mm/hr (ref 0–30)

## 2018-01-20 ENCOUNTER — Telehealth: Payer: Self-pay | Admitting: Neurology

## 2018-01-20 NOTE — Telephone Encounter (Signed)
Called to go over lab work. No answer. LVM for the patient to call back

## 2018-01-25 NOTE — Telephone Encounter (Signed)
Spoke with patient. He is aware that his glucose was elevated on labs but otherwise labs are unremarkable. He is aware that glucose has been elevated in the past. I encouraged pt to continue f/u with PCP. He verbalized understanding.

## 2018-01-28 ENCOUNTER — Telehealth: Payer: Self-pay | Admitting: *Deleted

## 2018-01-28 DIAGNOSIS — M545 Low back pain: Secondary | ICD-10-CM | POA: Diagnosis not present

## 2018-01-28 DIAGNOSIS — M5136 Other intervertebral disc degeneration, lumbar region: Secondary | ICD-10-CM | POA: Diagnosis not present

## 2018-01-28 DIAGNOSIS — G8929 Other chronic pain: Secondary | ICD-10-CM | POA: Diagnosis not present

## 2018-01-28 DIAGNOSIS — Z79899 Other long term (current) drug therapy: Secondary | ICD-10-CM | POA: Diagnosis not present

## 2018-01-28 NOTE — Telephone Encounter (Signed)
[facility]

## 2018-02-02 DIAGNOSIS — F17200 Nicotine dependence, unspecified, uncomplicated: Secondary | ICD-10-CM | POA: Diagnosis not present

## 2018-02-02 DIAGNOSIS — E119 Type 2 diabetes mellitus without complications: Secondary | ICD-10-CM | POA: Diagnosis not present

## 2018-02-02 DIAGNOSIS — M5136 Other intervertebral disc degeneration, lumbar region: Secondary | ICD-10-CM | POA: Diagnosis not present

## 2018-02-02 DIAGNOSIS — I25110 Atherosclerotic heart disease of native coronary artery with unstable angina pectoris: Secondary | ICD-10-CM | POA: Diagnosis not present

## 2018-02-16 ENCOUNTER — Other Ambulatory Visit: Payer: Self-pay | Admitting: Adult Health

## 2018-02-25 DIAGNOSIS — Z79899 Other long term (current) drug therapy: Secondary | ICD-10-CM | POA: Diagnosis not present

## 2018-02-25 DIAGNOSIS — M5136 Other intervertebral disc degeneration, lumbar region: Secondary | ICD-10-CM | POA: Diagnosis not present

## 2018-02-25 DIAGNOSIS — I10 Essential (primary) hypertension: Secondary | ICD-10-CM | POA: Diagnosis not present

## 2018-03-20 ENCOUNTER — Other Ambulatory Visit: Payer: Self-pay | Admitting: Cardiology

## 2018-03-25 DIAGNOSIS — Z79899 Other long term (current) drug therapy: Secondary | ICD-10-CM | POA: Diagnosis not present

## 2018-03-25 DIAGNOSIS — M5136 Other intervertebral disc degeneration, lumbar region: Secondary | ICD-10-CM | POA: Diagnosis not present

## 2018-04-23 DIAGNOSIS — Z79899 Other long term (current) drug therapy: Secondary | ICD-10-CM | POA: Diagnosis not present

## 2018-04-23 DIAGNOSIS — M5136 Other intervertebral disc degeneration, lumbar region: Secondary | ICD-10-CM | POA: Diagnosis not present

## 2018-05-07 DIAGNOSIS — G894 Chronic pain syndrome: Secondary | ICD-10-CM | POA: Diagnosis not present

## 2018-05-07 DIAGNOSIS — Z79899 Other long term (current) drug therapy: Secondary | ICD-10-CM | POA: Diagnosis not present

## 2018-05-07 DIAGNOSIS — M5136 Other intervertebral disc degeneration, lumbar region: Secondary | ICD-10-CM | POA: Diagnosis not present

## 2018-05-12 DIAGNOSIS — K219 Gastro-esophageal reflux disease without esophagitis: Secondary | ICD-10-CM | POA: Diagnosis not present

## 2018-05-12 DIAGNOSIS — I25111 Atherosclerotic heart disease of native coronary artery with angina pectoris with documented spasm: Secondary | ICD-10-CM | POA: Diagnosis not present

## 2018-05-12 DIAGNOSIS — E782 Mixed hyperlipidemia: Secondary | ICD-10-CM | POA: Diagnosis not present

## 2018-05-12 DIAGNOSIS — F17210 Nicotine dependence, cigarettes, uncomplicated: Secondary | ICD-10-CM | POA: Diagnosis not present

## 2018-05-12 DIAGNOSIS — I10 Essential (primary) hypertension: Secondary | ICD-10-CM | POA: Diagnosis not present

## 2018-05-12 DIAGNOSIS — Z1212 Encounter for screening for malignant neoplasm of rectum: Secondary | ICD-10-CM | POA: Diagnosis not present

## 2018-05-12 DIAGNOSIS — Z0001 Encounter for general adult medical examination with abnormal findings: Secondary | ICD-10-CM | POA: Diagnosis not present

## 2018-05-12 DIAGNOSIS — E8881 Metabolic syndrome: Secondary | ICD-10-CM | POA: Diagnosis not present

## 2018-05-12 DIAGNOSIS — M545 Low back pain: Secondary | ICD-10-CM | POA: Diagnosis not present

## 2018-05-12 DIAGNOSIS — F331 Major depressive disorder, recurrent, moderate: Secondary | ICD-10-CM | POA: Diagnosis not present

## 2018-05-12 DIAGNOSIS — J301 Allergic rhinitis due to pollen: Secondary | ICD-10-CM | POA: Diagnosis not present

## 2018-05-12 DIAGNOSIS — E119 Type 2 diabetes mellitus without complications: Secondary | ICD-10-CM | POA: Diagnosis not present

## 2018-05-12 DIAGNOSIS — M797 Fibromyalgia: Secondary | ICD-10-CM | POA: Diagnosis not present

## 2018-05-12 DIAGNOSIS — M5136 Other intervertebral disc degeneration, lumbar region: Secondary | ICD-10-CM | POA: Diagnosis not present

## 2018-05-19 ENCOUNTER — Encounter: Payer: Self-pay | Admitting: Adult Health

## 2018-05-19 ENCOUNTER — Ambulatory Visit: Payer: Medicare Other | Admitting: Adult Health

## 2018-06-04 DIAGNOSIS — Z79899 Other long term (current) drug therapy: Secondary | ICD-10-CM | POA: Diagnosis not present

## 2018-06-04 DIAGNOSIS — G894 Chronic pain syndrome: Secondary | ICD-10-CM | POA: Diagnosis not present

## 2018-06-04 DIAGNOSIS — M5136 Other intervertebral disc degeneration, lumbar region: Secondary | ICD-10-CM | POA: Diagnosis not present

## 2018-06-24 ENCOUNTER — Other Ambulatory Visit: Payer: Self-pay | Admitting: Neurology

## 2018-06-24 DIAGNOSIS — G44221 Chronic tension-type headache, intractable: Secondary | ICD-10-CM

## 2018-06-24 DIAGNOSIS — R519 Headache, unspecified: Secondary | ICD-10-CM

## 2018-06-24 DIAGNOSIS — R51 Headache: Secondary | ICD-10-CM

## 2018-06-24 DIAGNOSIS — H539 Unspecified visual disturbance: Secondary | ICD-10-CM

## 2018-06-25 ENCOUNTER — Telehealth: Payer: Self-pay | Admitting: *Deleted

## 2018-06-25 NOTE — Telephone Encounter (Signed)
Thanks, please ensure follow up with RN
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
## 2018-06-29 ENCOUNTER — Ambulatory Visit: Payer: Self-pay | Admitting: Adult Health

## 2018-06-29 NOTE — Telephone Encounter (Addendum)
Noted thank you. This is pt's second no show.

## 2018-06-29 NOTE — Telephone Encounter (Signed)
Patient was no show for follow up with NP today.  

## 2018-06-30 ENCOUNTER — Encounter: Payer: Self-pay | Admitting: Adult Health

## 2018-07-01 DIAGNOSIS — E119 Type 2 diabetes mellitus without complications: Secondary | ICD-10-CM | POA: Diagnosis not present

## 2018-07-01 DIAGNOSIS — E7800 Pure hypercholesterolemia, unspecified: Secondary | ICD-10-CM | POA: Diagnosis not present

## 2018-07-01 DIAGNOSIS — R4182 Altered mental status, unspecified: Secondary | ICD-10-CM | POA: Diagnosis not present

## 2018-07-01 DIAGNOSIS — Z6827 Body mass index (BMI) 27.0-27.9, adult: Secondary | ICD-10-CM | POA: Diagnosis not present

## 2018-07-01 DIAGNOSIS — E669 Obesity, unspecified: Secondary | ICD-10-CM | POA: Diagnosis not present

## 2018-07-01 DIAGNOSIS — I639 Cerebral infarction, unspecified: Secondary | ICD-10-CM | POA: Diagnosis not present

## 2018-07-01 DIAGNOSIS — R5383 Other fatigue: Secondary | ICD-10-CM | POA: Diagnosis not present

## 2018-07-01 DIAGNOSIS — R410 Disorientation, unspecified: Secondary | ICD-10-CM | POA: Diagnosis not present

## 2018-07-01 DIAGNOSIS — I10 Essential (primary) hypertension: Secondary | ICD-10-CM | POA: Diagnosis not present

## 2018-07-02 DIAGNOSIS — E669 Obesity, unspecified: Secondary | ICD-10-CM | POA: Diagnosis present

## 2018-07-02 DIAGNOSIS — Z8249 Family history of ischemic heart disease and other diseases of the circulatory system: Secondary | ICD-10-CM | POA: Diagnosis not present

## 2018-07-02 DIAGNOSIS — I639 Cerebral infarction, unspecified: Secondary | ICD-10-CM | POA: Diagnosis not present

## 2018-07-02 DIAGNOSIS — R4182 Altered mental status, unspecified: Secondary | ICD-10-CM | POA: Diagnosis not present

## 2018-07-02 DIAGNOSIS — Z7984 Long term (current) use of oral hypoglycemic drugs: Secondary | ICD-10-CM | POA: Diagnosis not present

## 2018-07-02 DIAGNOSIS — R410 Disorientation, unspecified: Secondary | ICD-10-CM | POA: Diagnosis not present

## 2018-07-02 DIAGNOSIS — Z79899 Other long term (current) drug therapy: Secondary | ICD-10-CM | POA: Diagnosis not present

## 2018-07-02 DIAGNOSIS — I6523 Occlusion and stenosis of bilateral carotid arteries: Secondary | ICD-10-CM | POA: Diagnosis not present

## 2018-07-02 DIAGNOSIS — M797 Fibromyalgia: Secondary | ICD-10-CM | POA: Diagnosis present

## 2018-07-02 DIAGNOSIS — E7800 Pure hypercholesterolemia, unspecified: Secondary | ICD-10-CM | POA: Diagnosis present

## 2018-07-02 DIAGNOSIS — I2510 Atherosclerotic heart disease of native coronary artery without angina pectoris: Secondary | ICD-10-CM | POA: Diagnosis present

## 2018-07-02 DIAGNOSIS — E119 Type 2 diabetes mellitus without complications: Secondary | ICD-10-CM | POA: Diagnosis not present

## 2018-07-02 DIAGNOSIS — G894 Chronic pain syndrome: Secondary | ICD-10-CM | POA: Diagnosis present

## 2018-07-02 DIAGNOSIS — F17210 Nicotine dependence, cigarettes, uncomplicated: Secondary | ICD-10-CM | POA: Diagnosis present

## 2018-07-02 DIAGNOSIS — I10 Essential (primary) hypertension: Secondary | ICD-10-CM | POA: Diagnosis present

## 2018-07-02 DIAGNOSIS — I252 Old myocardial infarction: Secondary | ICD-10-CM | POA: Diagnosis not present

## 2018-07-02 DIAGNOSIS — Z6827 Body mass index (BMI) 27.0-27.9, adult: Secondary | ICD-10-CM | POA: Diagnosis not present

## 2018-07-02 DIAGNOSIS — M549 Dorsalgia, unspecified: Secondary | ICD-10-CM | POA: Diagnosis present

## 2018-07-02 DIAGNOSIS — R5383 Other fatigue: Secondary | ICD-10-CM | POA: Diagnosis not present

## 2018-07-04 DIAGNOSIS — I639 Cerebral infarction, unspecified: Secondary | ICD-10-CM | POA: Diagnosis not present

## 2018-07-04 DIAGNOSIS — E119 Type 2 diabetes mellitus without complications: Secondary | ICD-10-CM | POA: Diagnosis not present

## 2018-07-05 ENCOUNTER — Telehealth: Payer: Self-pay | Admitting: Adult Health

## 2018-07-05 NOTE — Telephone Encounter (Signed)
Pts wife Bonita QuinLinda returning RNs call, advised of what had been noted. Bonita QuinLinda wish to discuss with RN

## 2018-07-05 NOTE — Telephone Encounter (Signed)
Spoke with wife who had not listened to this RN's VM. Advised her of VM. She stated "That's what he wanted to know." She verbalized understanding, appreciation of call back.

## 2018-07-05 NOTE — Telephone Encounter (Signed)
Pt's wife Howard Nelson [facility]) advised he had a stroke on 7/22 and [facility]. Pt had an appt with Megan on 7/23. He is having difficulty with memory and word finding. Please call to advise if he needs to reshedule a follow up appt.

## 2018-07-05 NOTE — Telephone Encounter (Signed)
[facility] [facility]. Advised her that if he needs to be seen in our office for stroke follow up, either the hospital Dr or his PCP would refer back to us for stroke follow up. Advised if he needs to be seen for his headaches which Dr Lucia GaskinsAhern has seen him for in past, she can call back after he is feeling better and reschedule his follow up for headaches.  Left office number.

## 2018-07-12 DIAGNOSIS — Z79899 Other long term (current) drug therapy: Secondary | ICD-10-CM | POA: Diagnosis not present

## 2018-07-12 DIAGNOSIS — Z6829 Body mass index (BMI) 29.0-29.9, adult: Secondary | ICD-10-CM | POA: Diagnosis not present

## 2018-07-12 DIAGNOSIS — M5136 Other intervertebral disc degeneration, lumbar region: Secondary | ICD-10-CM | POA: Diagnosis not present

## 2018-07-22 DIAGNOSIS — E119 Type 2 diabetes mellitus without complications: Secondary | ICD-10-CM | POA: Diagnosis not present

## 2018-07-22 DIAGNOSIS — I2510 Atherosclerotic heart disease of native coronary artery without angina pectoris: Secondary | ICD-10-CM | POA: Diagnosis not present

## 2018-07-22 DIAGNOSIS — I10 Essential (primary) hypertension: Secondary | ICD-10-CM | POA: Diagnosis not present

## 2018-07-22 DIAGNOSIS — I69320 Aphasia following cerebral infarction: Secondary | ICD-10-CM | POA: Diagnosis not present

## 2018-08-02 DIAGNOSIS — Z6826 Body mass index (BMI) 26.0-26.9, adult: Secondary | ICD-10-CM | POA: Diagnosis not present

## 2018-08-02 DIAGNOSIS — E1165 Type 2 diabetes mellitus with hyperglycemia: Secondary | ICD-10-CM | POA: Diagnosis not present

## 2018-08-02 DIAGNOSIS — I69320 Aphasia following cerebral infarction: Secondary | ICD-10-CM | POA: Diagnosis not present

## 2018-08-02 DIAGNOSIS — I10 Essential (primary) hypertension: Secondary | ICD-10-CM | POA: Diagnosis not present

## 2018-08-02 DIAGNOSIS — E782 Mixed hyperlipidemia: Secondary | ICD-10-CM | POA: Diagnosis not present

## 2018-08-03 DIAGNOSIS — G43909 Migraine, unspecified, not intractable, without status migrainosus: Secondary | ICD-10-CM | POA: Diagnosis not present

## 2018-08-03 DIAGNOSIS — I10 Essential (primary) hypertension: Secondary | ICD-10-CM | POA: Diagnosis not present

## 2018-08-03 DIAGNOSIS — G894 Chronic pain syndrome: Secondary | ICD-10-CM | POA: Diagnosis not present

## 2018-08-03 DIAGNOSIS — Z79899 Other long term (current) drug therapy: Secondary | ICD-10-CM | POA: Diagnosis not present

## 2018-08-03 DIAGNOSIS — M5136 Other intervertebral disc degeneration, lumbar region: Secondary | ICD-10-CM | POA: Diagnosis not present

## 2018-08-16 DIAGNOSIS — I10 Essential (primary) hypertension: Secondary | ICD-10-CM | POA: Diagnosis not present

## 2018-08-16 DIAGNOSIS — E8881 Metabolic syndrome: Secondary | ICD-10-CM | POA: Diagnosis not present

## 2018-08-16 DIAGNOSIS — Z79891 Long term (current) use of opiate analgesic: Secondary | ICD-10-CM | POA: Diagnosis not present

## 2018-08-16 DIAGNOSIS — E782 Mixed hyperlipidemia: Secondary | ICD-10-CM | POA: Diagnosis not present

## 2018-08-16 DIAGNOSIS — E1165 Type 2 diabetes mellitus with hyperglycemia: Secondary | ICD-10-CM | POA: Diagnosis not present

## 2018-08-16 DIAGNOSIS — K210 Gastro-esophageal reflux disease with esophagitis: Secondary | ICD-10-CM | POA: Diagnosis not present

## 2018-08-16 DIAGNOSIS — Z9189 Other specified personal risk factors, not elsewhere classified: Secondary | ICD-10-CM | POA: Diagnosis not present

## 2018-08-16 DIAGNOSIS — M797 Fibromyalgia: Secondary | ICD-10-CM | POA: Diagnosis not present

## 2018-08-19 DIAGNOSIS — E782 Mixed hyperlipidemia: Secondary | ICD-10-CM | POA: Diagnosis not present

## 2018-08-19 DIAGNOSIS — Z6826 Body mass index (BMI) 26.0-26.9, adult: Secondary | ICD-10-CM | POA: Diagnosis not present

## 2018-08-19 DIAGNOSIS — E1159 Type 2 diabetes mellitus with other circulatory complications: Secondary | ICD-10-CM | POA: Diagnosis not present

## 2018-08-19 DIAGNOSIS — Z79891 Long term (current) use of opiate analgesic: Secondary | ICD-10-CM | POA: Diagnosis not present

## 2018-08-19 DIAGNOSIS — I25111 Atherosclerotic heart disease of native coronary artery with angina pectoris with documented spasm: Secondary | ICD-10-CM | POA: Diagnosis not present

## 2018-08-19 DIAGNOSIS — Z23 Encounter for immunization: Secondary | ICD-10-CM | POA: Diagnosis not present

## 2018-08-19 DIAGNOSIS — M545 Low back pain: Secondary | ICD-10-CM | POA: Diagnosis not present

## 2018-08-19 DIAGNOSIS — F17210 Nicotine dependence, cigarettes, uncomplicated: Secondary | ICD-10-CM | POA: Diagnosis not present

## 2018-08-23 DIAGNOSIS — R41841 Cognitive communication deficit: Secondary | ICD-10-CM | POA: Diagnosis not present

## 2018-08-23 DIAGNOSIS — I69320 Aphasia following cerebral infarction: Secondary | ICD-10-CM | POA: Diagnosis not present

## 2018-08-26 DIAGNOSIS — R41841 Cognitive communication deficit: Secondary | ICD-10-CM | POA: Diagnosis not present

## 2018-08-26 DIAGNOSIS — I69320 Aphasia following cerebral infarction: Secondary | ICD-10-CM | POA: Diagnosis not present

## 2018-08-27 DIAGNOSIS — M5136 Other intervertebral disc degeneration, lumbar region: Secondary | ICD-10-CM | POA: Diagnosis not present

## 2018-08-27 DIAGNOSIS — Z79899 Other long term (current) drug therapy: Secondary | ICD-10-CM | POA: Diagnosis not present

## 2018-08-27 DIAGNOSIS — G894 Chronic pain syndrome: Secondary | ICD-10-CM | POA: Diagnosis not present

## 2018-08-30 DIAGNOSIS — I69320 Aphasia following cerebral infarction: Secondary | ICD-10-CM | POA: Diagnosis not present

## 2018-08-30 DIAGNOSIS — R41841 Cognitive communication deficit: Secondary | ICD-10-CM | POA: Diagnosis not present

## 2018-09-02 DIAGNOSIS — R41841 Cognitive communication deficit: Secondary | ICD-10-CM | POA: Diagnosis not present

## 2018-09-02 DIAGNOSIS — I69320 Aphasia following cerebral infarction: Secondary | ICD-10-CM | POA: Diagnosis not present

## 2018-09-13 DIAGNOSIS — R41841 Cognitive communication deficit: Secondary | ICD-10-CM | POA: Diagnosis not present

## 2018-09-13 DIAGNOSIS — I69320 Aphasia following cerebral infarction: Secondary | ICD-10-CM | POA: Diagnosis not present

## 2018-09-16 ENCOUNTER — Other Ambulatory Visit: Payer: Self-pay | Admitting: Cardiology

## 2018-09-16 DIAGNOSIS — R41841 Cognitive communication deficit: Secondary | ICD-10-CM | POA: Diagnosis not present

## 2018-09-16 DIAGNOSIS — I69320 Aphasia following cerebral infarction: Secondary | ICD-10-CM | POA: Diagnosis not present

## 2018-09-19 ENCOUNTER — Other Ambulatory Visit: Payer: Self-pay | Admitting: Neurology

## 2018-09-19 DIAGNOSIS — R519 Headache, unspecified: Secondary | ICD-10-CM

## 2018-09-19 DIAGNOSIS — H539 Unspecified visual disturbance: Secondary | ICD-10-CM

## 2018-09-19 DIAGNOSIS — G44221 Chronic tension-type headache, intractable: Secondary | ICD-10-CM

## 2018-09-19 DIAGNOSIS — R51 Headache: Secondary | ICD-10-CM

## 2018-09-20 DIAGNOSIS — I69320 Aphasia following cerebral infarction: Secondary | ICD-10-CM | POA: Diagnosis not present

## 2018-09-20 DIAGNOSIS — R41841 Cognitive communication deficit: Secondary | ICD-10-CM | POA: Diagnosis not present

## 2018-09-24 DIAGNOSIS — M5136 Other intervertebral disc degeneration, lumbar region: Secondary | ICD-10-CM | POA: Diagnosis not present

## 2018-09-24 DIAGNOSIS — G894 Chronic pain syndrome: Secondary | ICD-10-CM | POA: Diagnosis not present

## 2018-09-24 DIAGNOSIS — I10 Essential (primary) hypertension: Secondary | ICD-10-CM | POA: Diagnosis not present

## 2018-09-24 DIAGNOSIS — Z79899 Other long term (current) drug therapy: Secondary | ICD-10-CM | POA: Diagnosis not present

## 2018-09-30 DIAGNOSIS — G894 Chronic pain syndrome: Secondary | ICD-10-CM | POA: Diagnosis not present

## 2018-09-30 DIAGNOSIS — Z79899 Other long term (current) drug therapy: Secondary | ICD-10-CM | POA: Diagnosis not present

## 2018-09-30 DIAGNOSIS — I69320 Aphasia following cerebral infarction: Secondary | ICD-10-CM | POA: Diagnosis not present

## 2018-09-30 DIAGNOSIS — M5136 Other intervertebral disc degeneration, lumbar region: Secondary | ICD-10-CM | POA: Diagnosis not present

## 2018-09-30 DIAGNOSIS — R41841 Cognitive communication deficit: Secondary | ICD-10-CM | POA: Diagnosis not present

## 2018-10-04 DIAGNOSIS — R41841 Cognitive communication deficit: Secondary | ICD-10-CM | POA: Diagnosis not present

## 2018-10-04 DIAGNOSIS — I69320 Aphasia following cerebral infarction: Secondary | ICD-10-CM | POA: Diagnosis not present

## 2018-10-07 DIAGNOSIS — I69320 Aphasia following cerebral infarction: Secondary | ICD-10-CM | POA: Diagnosis not present

## 2018-10-07 DIAGNOSIS — R41841 Cognitive communication deficit: Secondary | ICD-10-CM | POA: Diagnosis not present

## 2018-10-11 DIAGNOSIS — I69320 Aphasia following cerebral infarction: Secondary | ICD-10-CM | POA: Diagnosis not present

## 2018-10-14 DIAGNOSIS — I69320 Aphasia following cerebral infarction: Secondary | ICD-10-CM | POA: Diagnosis not present

## 2018-10-28 DIAGNOSIS — I10 Essential (primary) hypertension: Secondary | ICD-10-CM | POA: Diagnosis not present

## 2018-10-28 DIAGNOSIS — G894 Chronic pain syndrome: Secondary | ICD-10-CM | POA: Diagnosis not present

## 2018-10-28 DIAGNOSIS — Z79899 Other long term (current) drug therapy: Secondary | ICD-10-CM | POA: Diagnosis not present

## 2018-10-28 DIAGNOSIS — M5136 Other intervertebral disc degeneration, lumbar region: Secondary | ICD-10-CM | POA: Diagnosis not present

## 2018-11-23 DIAGNOSIS — E1165 Type 2 diabetes mellitus with hyperglycemia: Secondary | ICD-10-CM | POA: Diagnosis not present

## 2018-11-23 DIAGNOSIS — Z79891 Long term (current) use of opiate analgesic: Secondary | ICD-10-CM | POA: Diagnosis not present

## 2018-11-23 DIAGNOSIS — I10 Essential (primary) hypertension: Secondary | ICD-10-CM | POA: Diagnosis not present

## 2018-11-23 DIAGNOSIS — K210 Gastro-esophageal reflux disease with esophagitis: Secondary | ICD-10-CM | POA: Diagnosis not present

## 2018-11-23 DIAGNOSIS — M545 Low back pain: Secondary | ICD-10-CM | POA: Diagnosis not present

## 2018-11-23 DIAGNOSIS — E8881 Metabolic syndrome: Secondary | ICD-10-CM | POA: Diagnosis not present

## 2018-11-23 DIAGNOSIS — E782 Mixed hyperlipidemia: Secondary | ICD-10-CM | POA: Diagnosis not present

## 2018-11-23 DIAGNOSIS — M797 Fibromyalgia: Secondary | ICD-10-CM | POA: Diagnosis not present

## 2018-11-26 DIAGNOSIS — Z23 Encounter for immunization: Secondary | ICD-10-CM | POA: Diagnosis not present

## 2018-11-26 DIAGNOSIS — M5136 Other intervertebral disc degeneration, lumbar region: Secondary | ICD-10-CM | POA: Diagnosis not present

## 2018-11-26 DIAGNOSIS — I25111 Atherosclerotic heart disease of native coronary artery with angina pectoris with documented spasm: Secondary | ICD-10-CM | POA: Diagnosis not present

## 2018-11-26 DIAGNOSIS — Z6825 Body mass index (BMI) 25.0-25.9, adult: Secondary | ICD-10-CM | POA: Diagnosis not present

## 2018-11-26 DIAGNOSIS — F17210 Nicotine dependence, cigarettes, uncomplicated: Secondary | ICD-10-CM | POA: Diagnosis not present

## 2018-11-26 DIAGNOSIS — E1159 Type 2 diabetes mellitus with other circulatory complications: Secondary | ICD-10-CM | POA: Diagnosis not present

## 2018-11-26 DIAGNOSIS — E1165 Type 2 diabetes mellitus with hyperglycemia: Secondary | ICD-10-CM | POA: Diagnosis not present

## 2018-11-26 DIAGNOSIS — M797 Fibromyalgia: Secondary | ICD-10-CM | POA: Diagnosis not present

## 2018-12-06 DIAGNOSIS — M62830 Muscle spasm of back: Secondary | ICD-10-CM | POA: Diagnosis not present

## 2018-12-06 DIAGNOSIS — M5136 Other intervertebral disc degeneration, lumbar region: Secondary | ICD-10-CM | POA: Diagnosis not present

## 2018-12-06 DIAGNOSIS — Z79899 Other long term (current) drug therapy: Secondary | ICD-10-CM | POA: Diagnosis not present

## 2018-12-06 DIAGNOSIS — M545 Low back pain: Secondary | ICD-10-CM | POA: Diagnosis not present

## 2018-12-06 DIAGNOSIS — G894 Chronic pain syndrome: Secondary | ICD-10-CM | POA: Diagnosis not present

## 2018-12-22 DIAGNOSIS — Z6826 Body mass index (BMI) 26.0-26.9, adult: Secondary | ICD-10-CM | POA: Diagnosis not present

## 2018-12-22 DIAGNOSIS — L209 Atopic dermatitis, unspecified: Secondary | ICD-10-CM | POA: Diagnosis not present

## 2018-12-22 DIAGNOSIS — M79643 Pain in unspecified hand: Secondary | ICD-10-CM | POA: Diagnosis not present

## 2019-01-03 DIAGNOSIS — I10 Essential (primary) hypertension: Secondary | ICD-10-CM | POA: Diagnosis not present

## 2019-01-03 DIAGNOSIS — M5136 Other intervertebral disc degeneration, lumbar region: Secondary | ICD-10-CM | POA: Diagnosis not present

## 2019-01-03 DIAGNOSIS — Z79899 Other long term (current) drug therapy: Secondary | ICD-10-CM | POA: Diagnosis not present

## 2019-01-03 DIAGNOSIS — G894 Chronic pain syndrome: Secondary | ICD-10-CM | POA: Diagnosis not present

## 2019-01-11 ENCOUNTER — Other Ambulatory Visit: Payer: Self-pay

## 2019-01-11 ENCOUNTER — Emergency Department (HOSPITAL_COMMUNITY): Payer: Medicare Other

## 2019-01-11 ENCOUNTER — Observation Stay (HOSPITAL_COMMUNITY)
Admission: EM | Admit: 2019-01-11 | Discharge: 2019-01-12 | Disposition: A | Discharge status: Home / Self Care | Payer: Medicare Other | Attending: Internal Medicine | Admitting: Internal Medicine

## 2019-01-11 ENCOUNTER — Encounter (HOSPITAL_COMMUNITY): Payer: Self-pay | Admitting: *Deleted

## 2019-01-11 DIAGNOSIS — F17200 Nicotine dependence, unspecified, uncomplicated: Secondary | ICD-10-CM | POA: Diagnosis present

## 2019-01-11 DIAGNOSIS — I252 Old myocardial infarction: Secondary | ICD-10-CM | POA: Diagnosis not present

## 2019-01-11 DIAGNOSIS — R0602 Shortness of breath: Secondary | ICD-10-CM | POA: Diagnosis not present

## 2019-01-11 DIAGNOSIS — K219 Gastro-esophageal reflux disease without esophagitis: Secondary | ICD-10-CM | POA: Diagnosis not present

## 2019-01-11 DIAGNOSIS — Z955 Presence of coronary angioplasty implant and graft: Secondary | ICD-10-CM | POA: Insufficient documentation

## 2019-01-11 DIAGNOSIS — I200 Unstable angina: Secondary | ICD-10-CM | POA: Diagnosis not present

## 2019-01-11 DIAGNOSIS — Z7984 Long term (current) use of oral hypoglycemic drugs: Secondary | ICD-10-CM | POA: Diagnosis not present

## 2019-01-11 DIAGNOSIS — I2510 Atherosclerotic heart disease of native coronary artery without angina pectoris: Secondary | ICD-10-CM | POA: Diagnosis not present

## 2019-01-11 DIAGNOSIS — Z7902 Long term (current) use of antithrombotics/antiplatelets: Secondary | ICD-10-CM | POA: Insufficient documentation

## 2019-01-11 DIAGNOSIS — E785 Hyperlipidemia, unspecified: Secondary | ICD-10-CM | POA: Diagnosis present

## 2019-01-11 DIAGNOSIS — E118 Type 2 diabetes mellitus with unspecified complications: Secondary | ICD-10-CM | POA: Diagnosis present

## 2019-01-11 DIAGNOSIS — I10 Essential (primary) hypertension: Secondary | ICD-10-CM | POA: Diagnosis not present

## 2019-01-11 DIAGNOSIS — F329 Major depressive disorder, single episode, unspecified: Secondary | ICD-10-CM | POA: Diagnosis present

## 2019-01-11 DIAGNOSIS — R079 Chest pain, unspecified: Secondary | ICD-10-CM | POA: Diagnosis present

## 2019-01-11 DIAGNOSIS — D696 Thrombocytopenia, unspecified: Secondary | ICD-10-CM | POA: Diagnosis not present

## 2019-01-11 DIAGNOSIS — F17210 Nicotine dependence, cigarettes, uncomplicated: Secondary | ICD-10-CM | POA: Insufficient documentation

## 2019-01-11 DIAGNOSIS — Z79899 Other long term (current) drug therapy: Secondary | ICD-10-CM | POA: Diagnosis not present

## 2019-01-11 DIAGNOSIS — F32A Depression, unspecified: Secondary | ICD-10-CM | POA: Diagnosis present

## 2019-01-11 DIAGNOSIS — R0789 Other chest pain: Secondary | ICD-10-CM | POA: Diagnosis not present

## 2019-01-11 DIAGNOSIS — E119 Type 2 diabetes mellitus without complications: Secondary | ICD-10-CM | POA: Insufficient documentation

## 2019-01-11 DIAGNOSIS — E876 Hypokalemia: Secondary | ICD-10-CM | POA: Diagnosis not present

## 2019-01-11 HISTORY — DX: Type 2 diabetes mellitus without complications: E11.9

## 2019-01-11 HISTORY — DX: Essential (primary) hypertension: I10

## 2019-01-11 MED ORDER — ASPIRIN 81 MG PO CHEW
324.0000 mg | CHEWABLE_TABLET | Freq: Once | ORAL | Status: AC
  Administered 2019-01-12: 324 mg via ORAL
  Filled 2019-01-11: qty 4

## 2019-01-11 MED ORDER — SODIUM CHLORIDE 0.9% FLUSH
3.0000 mL | Freq: Once | INTRAVENOUS | Status: AC
  Administered 2019-01-12: 3 mL via INTRAVENOUS

## 2019-01-11 NOTE — ED Triage Notes (Signed)
Pt c/o intermittent chest pain x 3 days that keeps getting progressively worse; pt states nothing really makes it worse or better and the pain stays in the middle of the chest, no radiation

## 2019-01-11 NOTE — ED Provider Notes (Signed)
[facility] EMERGENCY DEPARTMENT Provider Note   CSN: [id number] Arrival date & time: 01/11/19  2314     History   Chief Complaint Chief Complaint  Patient presents with  . Chest Pain    HPI Howard NettlesBryan L Mak Sr. is a 58 y.o. male.  Patient with history of CAD with stents, diabetes, hyperlipidemia, hypertension, tobacco abuse presenting with intermittent right-sided chest pain for the past 3 days.  Reports no pain at this time but had lasting for several hours today.  Last episode of pain was just prior to arrival.  States he is having intermittent pain for the past 3 days it is not necessarily exertional or pleuritic and nothing in particular seems to bring it on.  Last for several minutes to hours at a time.  Associate with some shortness of breath and nausea.  No diaphoresis.  No radiation of the pain.  Pain reminds him of his cardiac type pain.  Believes his last stress test was 2 years ago.  No leg pain or leg swelling.  Did not take any nitroglycerin at home as he is out.  Denies any pain to his neck, back or arm.  The history is provided by the patient.  Chest Pain  Associated symptoms: shortness of breath   Associated symptoms: no abdominal pain, no dizziness, no fever, no headache, no nausea, no vomiting and no weakness     Past Medical History:  Diagnosis Date  . Chest pain   . Chronic low back pain   . Coronary atherosclerosis of native coronary artery 2009   stent  . Depression   . Diabetes mellitus (HCC)   . Dyslipidemia   . Fibromyalgia   . GERD (gastroesophageal reflux disease)   . Headache   . Heart attack (HCC)    three times  . HTN (hypertension)   . NSTEMI (non-ST elevated myocardial infarction) [facility](HCC)     Patient Active Problem List   Diagnosis Date Noted  . Type 2 diabetes mellitus with complication (HCC) 01/31/2017  . Accelerating angina (HCC) 01/30/2017  . TOBACCO ABUSE 12/24/2009  . EPIGASTRIC PAIN 12/24/2009  . Hyperlipidemia 06/15/2009  .  DEPRESSION 06/15/2009  . Essential hypertension 06/15/2009  . CORONARY ATHEROSCLEROSIS NATIVE CORONARY ARTERY 06/15/2009  . FIBROMYALGIA 06/15/2009  . CHEST PAIN 06/15/2009    Past Surgical History:  Procedure Laterality Date  . APPENDECTOMY    . CARDIAC CATHETERIZATION     [phone],2009  . CHOLECYSTECTOMY  2009  . CORONARY STENT PLACEMENT    . KNEE SURGERY Right   . LEFT HEART CATH AND CORONARY ANGIOGRAPHY N/A 01/30/2017   Procedure: Left Heart Cath and Coronary Angiography;  Surgeon: Yvonne Kendallhristopher End, MD;  [facility];  Service: Cardiovascular;  Laterality: N/A;  . NASAL SINUS SURGERY    . SHOULDER SURGERY     [phone],2005  . SHOULDER SURGERY     R & L        Home Medications    Prior to Admission medications   Medication Sig Start Date End Date Taking? Authorizing Provider  amitriptyline (ELAVIL) 25 MG tablet Take 2 tablets (50 mg total) by mouth at bedtime. 06/25/18   Anson FretAhern, Antonia B, MD  Calcium-Magnesium-Zinc (CAL-MAG-ZINC PO) Take 2 tablets by mouth at bedtime.     [provider]  DULoxetine (CYMBALTA) 60 MG capsule Take 60 mg by mouth at bedtime.     [provider]  fluticasone (FLONASE) 50 MCG/ACT nasal spray Place 2-3 sprays into both nostrils  daily as needed (congestion).  10/20/15   [provider]  gabapentin (NEURONTIN) 400 MG capsule Take 400 mg by mouth 3 (three) times daily.    [provider]  glipiZIDE (GLUCOTROL XL) 10 MG 24 hr tablet Take 10 mg by mouth at bedtime.    [provider]  lisinopril (PRINIVIL,ZESTRIL) 2.5 MG tablet Take 2.5 mg by mouth at bedtime.  10/19/15   [provider]  metoprolol succinate (TOPROL-XL) 25 MG 24 hr tablet Take 25 mg by mouth at bedtime.     [provider]  nitroGLYCERIN (NITROSTAT) 0.4 MG SL tablet Place 0.4 mg under the tongue every 5 (five) minutes as needed for chest pain.     [provider]  NUCYNTA 50 MG tablet Take 50 mg by mouth  every 8 (eight) hours as needed. 12/31/17   [provider]  omeprazole (PRILOSEC) 20 MG capsule Take 20 mg by mouth 2 (two) times daily as needed (acid reflux).     [provider]  oxycodone (ROXICODONE) 30 MG immediate release tablet Take 30 mg by mouth every 4 (four) hours. Scheduled per RX    [provider]  pravastatin (PRAVACHOL) 40 MG tablet TAKE 1 TABLET BY MOUTH EVERY DAY IN THE EVENING 09/16/18   Antoine Poche, MD    Family History Family History  Problem Relation Age of Onset  . Coronary artery disease Mother   . Coronary artery disease Father   . Diabetes Father     Social History Social History   Tobacco Use  . Smoking status: Current Every Day Smoker    Packs/day: 1.00    Types: Cigarettes    Start date: 12/01/1972  . Smokeless tobacco: Never Used  . Tobacco comment: 1.5-2 packs per day  Substance Use Topics  . Alcohol use: No    Alcohol/week: 0.0 standard drinks    Frequency: Never  . Drug use: No     Allergies   Ampicillin; Other; and Penicillins   Review of Systems Review of Systems  Constitutional: Negative for activity change, appetite change and fever.  HENT: Negative for congestion and rhinorrhea.   Eyes: Negative for visual disturbance.  Respiratory: Positive for chest tightness and shortness of breath.   Cardiovascular: Positive for chest pain.  Gastrointestinal: Negative for abdominal pain, nausea and vomiting.  Genitourinary: Negative for dysuria, hematuria and testicular pain.  Musculoskeletal: Negative for arthralgias and myalgias.  Skin: Negative for rash.  Neurological: Negative for dizziness, weakness and headaches.    all other systems are negative except as noted in the HPI and PMH.   Physical Exam Updated Vital Signs BP (!) 150/88 (BP Location: Right Arm)   Pulse 92   Temp 98 F (36.7 C) (Oral)   Resp 20   Ht 5' 10.5" (1.791 m)   Wt 83.9 kg   SpO2 95%   BMI 26.17 kg/m   Physical  Exam Vitals signs and nursing note reviewed.  Constitutional:      General: He is not in acute distress.    Appearance: He is well-developed.  HENT:     Head: Normocephalic and atraumatic.     Mouth/Throat:     Pharynx: No oropharyngeal exudate.  Eyes:     Conjunctiva/sclera: Conjunctivae normal.     Pupils: Pupils are equal, round, and reactive to light.  Neck:     Musculoskeletal: Normal range of motion and neck supple.     Comments: No meningismus. Cardiovascular:     Rate  and Rhythm: Normal rate and regular rhythm.     Heart sounds: Normal heart sounds. No murmur.     Comments: Equal radial pulses and grip strength bilaterally Pulmonary:     Effort: Pulmonary effort is normal. No respiratory distress.     Breath sounds: Normal breath sounds.  Abdominal:     Palpations: Abdomen is soft.     Tenderness: There is no abdominal tenderness. There is no guarding or rebound.  Musculoskeletal: Normal range of motion.        General: No tenderness.  Skin:    General: Skin is warm.  Neurological:     Mental Status: He is alert and oriented to person, place, and time.     Cranial Nerves: No cranial nerve deficit.     Motor: No abnormal muscle tone.     Coordination: Coordination normal.     Comments: No ataxia on finger to nose bilaterally. No pronator drift. 5/5 strength throughout. CN 2-12 intact.Equal grip strength. Sensation intact.   Psychiatric:        Behavior: Behavior normal.      ED Treatments / Results  Labs (all labs ordered are listed, but only abnormal results are displayed) Labs Reviewed  BASIC METABOLIC PANEL - Abnormal; Notable for the following components:      Result Value   Potassium 3.2 (*)    Glucose, Bld 299 (*)    Calcium 8.4 (*)    All other components within normal limits  CBC - Abnormal; Notable for the following components:   Platelets 107 (*)    All other components within normal limits  HEPATIC FUNCTION PANEL - Abnormal; Notable for the  following components:   AST 14 (*)    All other components within normal limits  TROPONIN I  LIPASE, BLOOD  TROPONIN I  TROPONIN I  MAGNESIUM  PHOSPHORUS  VITAMIN D 25 HYDROXY (VIT D DEFICIENCY, FRACTURES)    EKG EKG Interpretation  Date/Time:  Tuesday January 11 2019 23:22:58 EST Ventricular Rate:  93 PR Interval:    QRS Duration: 92 QT Interval:  357 QTC Calculation: 444 R Axis:   10 Text Interpretation:  Sinus rhythm Consider left atrial enlargement Low voltage, precordial leads Baseline wander in lead(s) I III aVL No significant change was found Confirmed by Glynn Octaveancour, Jerilyn Gillaspie [phone]([id number]) on 01/11/2019 11:31:41 PM   Radiology Dg Chest 2 View  Result Date: 01/12/2019 CLINICAL DATA:  Mid chest pain for 3 days EXAM: CHEST - 2 VIEW COMPARISON:  07/01/2018 FINDINGS: The heart size and mediastinal contours are within normal limits. Both lungs are clear. The visualized skeletal structures are unremarkable. IMPRESSION: No active cardiopulmonary disease. Electronically Signed   By: Elige KoHetal  Patel   On: 01/12/2019 00:38    Procedures Procedures (including critical care time)  Medications Ordered in ED Medications  sodium chloride flush (NS) 0.9 % injection 3 mL (has no administration in time range)     Initial Impression / Assessment and Plan / ED Course  I have reviewed the triage vital signs and the nursing notes.  Pertinent labs & imaging results that were available during my care of the patient were reviewed by me and considered in my medical decision making (see chart for details).    3 days of intermittent right-sided chest pain with shortness of breath, similar to previous episodes of angina.  EKG with T wave flattening without acute changes.  CATH 2/18 1. Mild to moderate, non-obstructive CAD involving the major epicardial coronary arteries. There is  tapering of the distal branches, likely representing small vessel disease in the setting of diabetes and tobacco  use. 2. Patent stents in the proximal and mid RCA with minimal in-stent restenosis. 3. Normal left ventricular contraction and filling pressure.  Patient given aspirin.  Remains chest pain-free in the ED.  We will hold nitroglycerin at this time.  Troponin negative x1.  Heart score is 5..  Concern for unstable angina the patient is chest pain-free at this time. Low suspicion for aortic dissection, pulmonary embolism.  Continue aspirin and IV heparin for unstable angina.  Plan admission to the hospital.  Discussed with Dr. Robb Matar.  CRITICAL CARE Performed by: Glynn Octave Total critical care time: 32 minutes Critical care time was exclusive of separately billable procedures and treating other patients. Critical care was necessary to treat or prevent imminent or life-threatening deterioration. Critical care was time spent personally by me on the following activities: development of treatment plan with patient and/or surrogate as well as nursing, discussions with consultants, evaluation of patient's response to treatment, examination of patient, obtaining history from patient or surrogate, ordering and performing treatments and interventions, ordering and review of laboratory studies, ordering and review of radiographic studies, pulse oximetry and re-evaluation of patient's condition.    Final Clinical Impressions(s) / ED Diagnoses   Final diagnoses:  Unstable angina [facility])    ED Discharge Orders    None       Glynn Octave, MD 01/12/19 0139

## 2019-01-12 ENCOUNTER — Other Ambulatory Visit: Payer: Self-pay

## 2019-01-12 ENCOUNTER — Encounter (HOSPITAL_COMMUNITY): Payer: Self-pay | Admitting: Internal Medicine

## 2019-01-12 ENCOUNTER — Observation Stay (HOSPITAL_BASED_OUTPATIENT_CLINIC_OR_DEPARTMENT_OTHER): Payer: Medicare Other

## 2019-01-12 DIAGNOSIS — E782 Mixed hyperlipidemia: Secondary | ICD-10-CM | POA: Diagnosis not present

## 2019-01-12 DIAGNOSIS — I10 Essential (primary) hypertension: Secondary | ICD-10-CM | POA: Diagnosis not present

## 2019-01-12 DIAGNOSIS — F17200 Nicotine dependence, unspecified, uncomplicated: Secondary | ICD-10-CM

## 2019-01-12 DIAGNOSIS — I25119 Atherosclerotic heart disease of native coronary artery with unspecified angina pectoris: Secondary | ICD-10-CM | POA: Diagnosis not present

## 2019-01-12 DIAGNOSIS — E876 Hypokalemia: Secondary | ICD-10-CM

## 2019-01-12 DIAGNOSIS — R079 Chest pain, unspecified: Secondary | ICD-10-CM

## 2019-01-12 DIAGNOSIS — E118 Type 2 diabetes mellitus with unspecified complications: Secondary | ICD-10-CM

## 2019-01-12 DIAGNOSIS — D696 Thrombocytopenia, unspecified: Secondary | ICD-10-CM

## 2019-01-12 DIAGNOSIS — F329 Major depressive disorder, single episode, unspecified: Secondary | ICD-10-CM

## 2019-01-12 DIAGNOSIS — K219 Gastro-esophageal reflux disease without esophagitis: Secondary | ICD-10-CM | POA: Diagnosis present

## 2019-01-12 LAB — CBC
HCT: 43.3 % (ref 39.0–52.0)
Hemoglobin: 14.3 g/dL (ref 13.0–17.0)
MCH: 30.4 pg (ref 26.0–34.0)
MCHC: 33 g/dL (ref 30.0–36.0)
MCV: 92.1 fL (ref 80.0–100.0)
PLATELETS: 107 10*3/uL — AB (ref 150–400)
RBC: 4.7 MIL/uL (ref 4.22–5.81)
RDW: 12.3 % (ref 11.5–15.5)
WBC: 6.1 10*3/uL (ref 4.0–10.5)
nRBC: 0 % (ref 0.0–0.2)

## 2019-01-12 LAB — BASIC METABOLIC PANEL
Anion gap: 7 (ref 5–15)
BUN: 16 mg/dL (ref 6–20)
CALCIUM: 8.4 mg/dL — AB (ref 8.9–10.3)
CO2: 23 mmol/L (ref 22–32)
CREATININE: 0.8 mg/dL (ref 0.61–1.24)
Chloride: 107 mmol/L (ref 98–111)
GFR calc Af Amer: >60 mL/min (ref >60)
GFR calc non Af Amer: >60 mL/min (ref >60)
GLUCOSE: 299 mg/dL — AB (ref 70–99)
Potassium: 3.2 mmol/L — ABNORMAL LOW (ref 3.5–5.1)
Sodium: 137 mmol/L (ref 135–145)

## 2019-01-12 LAB — TROPONIN I
Troponin I: <0.03 ng/mL (ref <0.03)
Troponin I: <0.03 ng/mL (ref <0.03)
Troponin I: <0.03 ng/mL (ref <0.03)

## 2019-01-12 LAB — ECHOCARDIOGRAM COMPLETE
Height: 70.5 in
Weight: 2960 oz

## 2019-01-12 LAB — PHOSPHORUS: PHOSPHORUS: 3.4 mg/dL (ref 2.5–4.6)

## 2019-01-12 LAB — HEPATIC FUNCTION PANEL
ALT: 17 U/L (ref 0–44)
AST: 14 U/L — ABNORMAL LOW (ref 15–41)
Albumin: 3.9 g/dL (ref 3.5–5.0)
Alkaline Phosphatase: 75 U/L (ref 38–126)
BILIRUBIN TOTAL: 0.4 mg/dL (ref 0.3–1.2)
Bilirubin, Direct: 0.1 mg/dL (ref 0.0–0.2)
Indirect Bilirubin: 0.3 mg/dL (ref 0.3–0.9)
Total Protein: 6.8 g/dL (ref 6.5–8.1)

## 2019-01-12 LAB — PROTIME-INR
INR: 1
Prothrombin Time: 13.1 seconds (ref 11.4–15.2)

## 2019-01-12 LAB — APTT: aPTT: 29 seconds (ref 24–36)

## 2019-01-12 LAB — LIPASE, BLOOD: Lipase: 41 U/L (ref 11–51)

## 2019-01-12 LAB — HEPARIN LEVEL (UNFRACTIONATED): Heparin Unfractionated: 0.13 IU/mL — ABNORMAL LOW (ref 0.30–0.70)

## 2019-01-12 LAB — MAGNESIUM: Magnesium: 1.9 mg/dL (ref 1.7–2.4)

## 2019-01-12 MED ORDER — PROCHLORPERAZINE EDISYLATE 10 MG/2ML IJ SOLN: 5.0000 mg | INTRAMUSCULAR | Status: DC | PRN

## 2019-01-12 MED ORDER — PRAVASTATIN SODIUM 40 MG PO TABS: 40.0000 mg | ORAL_TABLET | Freq: Every day | ORAL | 3 refills | Status: DC

## 2019-01-12 MED ORDER — ISOSORBIDE MONONITRATE ER 60 MG PO TB24
30.0000 mg | ORAL_TABLET | Freq: Every day | ORAL | Status: DC
  Administered 2019-01-12: 30 mg via ORAL
  Filled 2019-01-12: qty 1

## 2019-01-12 MED ORDER — HEPARIN BOLUS VIA INFUSION
4000.0000 [IU] | Freq: Once | INTRAVENOUS | Status: AC
  Administered 2019-01-12: 4000 [IU] via INTRAVENOUS

## 2019-01-12 MED ORDER — ISOSORBIDE MONONITRATE ER 30 MG PO TB24: 30.0000 mg | ORAL_TABLET | Freq: Every day | ORAL | 3 refills | Status: DC

## 2019-01-12 MED ORDER — ACETAMINOPHEN 650 MG RE SUPP: 650.0000 mg | Freq: Four times a day (QID) | RECTAL | Status: DC | PRN

## 2019-01-12 MED ORDER — ASPIRIN EC 81 MG PO TBEC
81.0000 mg | DELAYED_RELEASE_TABLET | Freq: Every day | ORAL | Status: DC
  Administered 2019-01-12: 81 mg via ORAL
  Filled 2019-01-12: qty 1

## 2019-01-12 MED ORDER — ENOXAPARIN SODIUM 40 MG/0.4ML ~~LOC~~ SOLN: 40.0000 mg | SUBCUTANEOUS | Status: DC

## 2019-01-12 MED ORDER — PANTOPRAZOLE SODIUM 40 MG PO TBEC
40.0000 mg | DELAYED_RELEASE_TABLET | Freq: Every day | ORAL | Status: DC
  Administered 2019-01-12: 40 mg via ORAL
  Filled 2019-01-12: qty 1

## 2019-01-12 MED ORDER — ACETAMINOPHEN 325 MG PO TABS: 650.0000 mg | ORAL_TABLET | Freq: Four times a day (QID) | ORAL | Status: DC | PRN

## 2019-01-12 MED ORDER — METOPROLOL SUCCINATE ER 25 MG PO TB24: 12.5000 mg | ORAL_TABLET | Freq: Every day | ORAL | 3 refills | Status: DC

## 2019-01-12 MED ORDER — HEPARIN (PORCINE) 25000 UT/250ML-% IV SOLN
1000.0000 [IU]/h | INTRAVENOUS | Status: DC
  Administered 2019-01-12: 1000 [IU]/h via INTRAVENOUS
  Filled 2019-01-12: qty 250

## 2019-01-12 MED ORDER — PRAVASTATIN SODIUM 40 MG PO TABS
40.0000 mg | ORAL_TABLET | Freq: Every day | ORAL | Status: DC
  Administered 2019-01-12: 40 mg via ORAL
  Filled 2019-01-12: qty 1

## 2019-01-12 MED ORDER — METOPROLOL SUCCINATE ER 25 MG PO TB24
12.5000 mg | ORAL_TABLET | Freq: Every day | ORAL | Status: DC
  Administered 2019-01-12: 12.5 mg via ORAL
  Filled 2019-01-12: qty 1

## 2019-01-12 MED ORDER — POTASSIUM CHLORIDE CRYS ER 20 MEQ PO TBCR
40.0000 meq | EXTENDED_RELEASE_TABLET | Freq: Once | ORAL | Status: AC
  Administered 2019-01-12: 40 meq via ORAL
  Filled 2019-01-12: qty 2

## 2019-01-12 MED ORDER — NICOTINE 21 MG/24HR TD PT24: 21.0000 mg | MEDICATED_PATCH | TRANSDERMAL | 2 refills | Status: DC

## 2019-01-12 MED ORDER — LISINOPRIL 2.5 MG PO TABS: 2.5000 mg | ORAL_TABLET | Freq: Every day | ORAL | 3 refills | Status: DC

## 2019-01-12 MED ORDER — MAGNESIUM SULFATE 2 GM/50ML IV SOLN
2.0000 g | Freq: Once | INTRAVENOUS | Status: AC
  Administered 2019-01-12: 2 g via INTRAVENOUS
  Filled 2019-01-12: qty 50

## 2019-01-12 MED ORDER — ALPRAZOLAM 0.25 MG PO TABS: 0.2500 mg | ORAL_TABLET | Freq: Two times a day (BID) | ORAL | Status: DC | PRN

## 2019-01-12 MED ORDER — LISINOPRIL 5 MG PO TABS
2.5000 mg | ORAL_TABLET | Freq: Every day | ORAL | Status: DC
  Administered 2019-01-12: 2.5 mg via ORAL
  Filled 2019-01-12: qty 1

## 2019-01-12 MED ORDER — ASPIRIN 81 MG PO TBEC: 81.0000 mg | DELAYED_RELEASE_TABLET | Freq: Every day | ORAL | 3 refills | Status: AC

## 2019-01-12 NOTE — ED Notes (Signed)
Pt reports he was sitting on couch at home after eating when chest pain worsened on right side of chest.

## 2019-01-12 NOTE — H&P (Signed)
History and Physical    Howard Nelson. ZOX:[id number] DOB: Apr 22, 1961 DOA: 01/11/2019  PCP: Howard Nelson, Terry G, MD   Patient coming from: Home.  I have personally briefly reviewed [facility]  Chief Complaint: Chest pain.  HPI: Howard Nelson. is a 58 y.o. male with medical history significant of chest pain, chronic lower back pain, CAD with history of NSTEMI x3 and stent placement, depression, type 2 diabetes, hyperlipidemia, fibromyalgia, GERD, history of headache, hypertension who is coming to the emergency department with complaints of a 3-day history of precordial chest pain very strong, but could not specify quality, non-radiated, with dyspnea, dizziness, palpitations, nausea or diaphoresis.  He denies recent lower extremity edema, PND or orthopnea.  He denies fever, chills, sore throat, wheezing, hemoptysis, abdominal pain, diarrhea, constipation, melena or hematochezia.  No dysuria, frequency or hematuria.  Denies polyuria, polydipsia, polyphagia and blurry vision.  ED Course: Vital signs temperature 98 F, pulse 92, respirations 20, blood pressure 150/88 mmHg and O2 sat 95% on room air.  The patient was started on a heparin infusion.  I added K. Dur 40 mEq p.o. x1 and magnesium sulfate.  His EKG was nonischemic.  Troponin so far negative.  CBC showed a white count of 6.1, hemoglobin 14.3 Nelson/dL and platelets [id number].  PT/INR/PTT within normal limits.  LFTs show low AST of 14 units/L but is otherwise unremarkable.  Lipase was normal.  Potassium was 3.2 mmol/L, glucose 299 and calcium 8.4 mg/dL.  All other values are within normal limits.  Chest radiograph did not show any active cardiopulmonary pathology.  Review of Systems: As per HPI otherwise 10 point review of systems negative.   Past Medical History:  Diagnosis Date  . Chest pain   . Chronic low back pain   . Coronary atherosclerosis of native coronary artery 2009   stent  . Depression   .  Diabetes mellitus (HCC)   . Dyslipidemia   . Fibromyalgia   . GERD (gastroesophageal reflux disease)   . Headache   . Heart attack (HCC)    three times  . HTN (hypertension)   . NSTEMI (non-ST elevated myocardial infarction) [facility](HCC)     Past Surgical History:  Procedure Laterality Date  . APPENDECTOMY    . CARDIAC CATHETERIZATION     [phone],2009  . CHOLECYSTECTOMY  2009  . CORONARY STENT PLACEMENT    . KNEE SURGERY Right   . LEFT HEART CATH AND CORONARY ANGIOGRAPHY N/A 01/30/2017   Procedure: Left Heart Cath and Coronary Angiography;  Surgeon: Yvonne Kendallhristopher End, MD;  [facility];  Service: Cardiovascular;  Laterality: N/A;  . NASAL SINUS SURGERY    . SHOULDER SURGERY     [phone],2005  . SHOULDER SURGERY     R & L     reports that he has been smoking cigarettes. He started smoking about 46 years ago. He has been smoking about 1.00 pack per day. He has never used smokeless tobacco. He reports that he does not drink alcohol or use drugs.  Allergies  Allergen Reactions  . Ampicillin Other (See Comments)    REACTION: Headache, diarrhea  . Other     HMG-COA Reductase Inhibitors - elevated liver tests  . Penicillins Diarrhea    Family History  Problem Relation Age of Onset  . Coronary artery disease Mother   . Coronary artery disease Father   . Diabetes Father     Prior to Admission medications  Medication Sig Start Date End Date Taking? Authorizing Provider  amitriptyline (ELAVIL) 25 MG tablet Take 2 tablets (50 mg total) by mouth at bedtime. 06/25/18   Anson Fret, MD  Calcium-Magnesium-Zinc (CAL-MAG-ZINC PO) Take 2 tablets by mouth at bedtime.     [provider]  DULoxetine (CYMBALTA) 60 MG capsule Take 60 mg by mouth at bedtime.     [provider]  fluticasone (FLONASE) 50 MCG/ACT nasal spray Place 2-3 sprays into both nostrils daily as needed (congestion).  10/20/15   [provider]  gabapentin (NEURONTIN) 400 MG capsule Take  400 mg by mouth 3 (three) times daily.    [provider]  glipiZIDE (GLUCOTROL XL) 10 MG 24 hr tablet Take 10 mg by mouth at bedtime.    [provider]  lisinopril (PRINIVIL,ZESTRIL) 2.5 MG tablet Take 2.5 mg by mouth at bedtime.  10/19/15   [provider]  metoprolol succinate (TOPROL-XL) 25 MG 24 hr tablet Take 25 mg by mouth at bedtime.     [provider]  nitroGLYCERIN (NITROSTAT) 0.4 MG SL tablet Place 0.4 mg under the tongue every 5 (five) minutes as needed for chest pain.     [provider]  NUCYNTA 50 MG tablet Take 50 mg by mouth every 8 (eight) hours as needed. 12/31/17   [provider]  omeprazole (PRILOSEC) 20 MG capsule Take 20 mg by mouth 2 (two) times daily as needed (acid reflux).     [provider]  oxycodone (ROXICODONE) 30 MG immediate release tablet Take 30 mg by mouth every 4 (four) hours. Scheduled per RX    [provider]  pravastatin (PRAVACHOL) 40 MG tablet TAKE 1 TABLET BY MOUTH EVERY DAY IN THE EVENING 09/16/18   Antoine Poche, MD    Physical Exam: Vitals:   01/11/19 2325 01/11/19 2330 01/12/19 0000 01/12/19 0100  BP: (!) 150/88 (!) 153/84 (!) 144/80 (!) 159/86  Pulse: 92 90 86 92  Resp: 20 16 19 19   Temp: 98 F (36.7 C)     TempSrc: Oral     SpO2: 95% 94% 96% 96%  Weight:      Height:        Constitutional: NAD, calm, comfortable Eyes: PERRL, lids and conjunctivae normal ENMT: Mucous membranes are moist. Posterior pharynx clear of any exudate or lesions. Neck: normal, supple, no masses, no thyromegaly Respiratory: clear to auscultation bilaterally, no wheezing, no crackles. Normal respiratory effort. No accessory muscle use.  Cardiovascular: Regular rate and rhythm, no murmurs / rubs / gallops. No extremity edema. 2+ pedal pulses. No carotid bruits.  Abdomen: Soft, no tenderness, no masses palpated. No hepatosplenomegaly. Bowel sounds positive.  Musculoskeletal: no clubbing  / cyanosis. No joint deformity upper and lower extremities. Good ROM, no contractures. Normal muscle tone.  Skin: no rashes, lesions, ulcers. No induration on limited dermatological examination. Neurologic: CN 2-12 grossly intact. Sensation intact, DTR normal. Strength 5/5 in all 4.  Psychiatric: Normal judgment and insight. Alert and oriented x 3. Normal mood.   Labs on Admission: I have personally reviewed following labs and imaging studies  CBC: Recent Labs  Lab 01/11/19 2327  WBC 6.1  HGB 14.3  HCT 43.3  MCV 92.1  PLT 107*   Basic Metabolic Panel: Recent Labs  Lab 01/11/19 2327  NA 137  K 3.2*  CL 107  CO2 23  GLUCOSE 299*  BUN 16  CREATININE 0.80  CALCIUM 8.4*   GFR: Estimated Creatinine Clearance: 104.3  mL/min (by C-Nelson formula based on SCr of 0.8 mg/dL). Liver Function Tests: Recent Labs  Lab 01/11/19 2327  AST 14*  ALT 17  ALKPHOS 75  BILITOT 0.4  PROT 6.8  ALBUMIN 3.9   Recent Labs  Lab 01/11/19 2327  LIPASE 41   No results for input(s): AMMONIA in the last 168 hours. Coagulation Profile: No results for input(s): INR, PROTIME in the last 168 hours. Cardiac Enzymes: Recent Labs  Lab 01/11/19 2327  TROPONINI <0.03   BNP (last 3 results) No results for input(s): PROBNP in the last 8760 hours. HbA1C: No results for input(s): HGBA1C in the last 72 hours. CBG: No results for input(s): GLUCAP in the last 168 hours. Lipid Profile: No results for input(s): CHOL, HDL, LDLCALC, TRIG, CHOLHDL, LDLDIRECT in the last 72 hours. Thyroid Function Tests: No results for input(s): TSH, T4TOTAL, FREET4, T3FREE, THYROIDAB in the last 72 hours. Anemia Panel: No results for input(s): VITAMINB12, FOLATE, FERRITIN, TIBC, IRON, RETICCTPCT in the last 72 hours. Urine analysis: No results found for: COLORURINE, APPEARANCEUR, LABSPEC, PHURINE, GLUCOSEU, HGBUR, BILIRUBINUR, KETONESUR, PROTEINUR, UROBILINOGEN, NITRITE, LEUKOCYTESUR  Radiological Exams on Admission: Dg  Chest 2 View  Result Date: 01/12/2019 CLINICAL DATA:  Mid chest pain for 3 days EXAM: CHEST - 2 VIEW COMPARISON:  07/01/2018 FINDINGS: The heart size and mediastinal contours are within normal limits. Both lungs are clear. The visualized skeletal structures are unremarkable. IMPRESSION: No active cardiopulmonary disease. Electronically Signed   By: Elige Ko   On: 01/12/2019 00:38    EKG: Independently reviewed.  Vent. rate 93 BPM PR interval * ms QRS duration 92 ms QT/QTc 357/444 ms P-R-T axes 49 10 38 Sinus rhythm Consider left atrial enlargement Low voltage, precordial leads Baseline wander in lead(s) I III aVL  Assessment/Plan Principal Problem:   Chest pain   CORONARY ATHEROSCLEROSIS NATIVE CORONARY ARTERY Observation/telemetry. Continue supplemental oxygen. Continue metoprolol once dose confirmed. Trend troponin levels. Repeat EKG in a.m. Check echocardiogram. Consult cardiology.  Active Problems:   Hyperlipidemia Continue pravastatin 40 mg p.o. daily once dose confirmed.    TOBACCO ABUSE Declined nicotine replacement therapy. Staff to provide tobacco cessation information.    Depression On duloxetine, but did not know the name of his medication or dosage.    Essential hypertension Continue metoprolol and lisinopril once dose is confirmed.    Type 2 diabetes mellitus with complication (HCC) Carbohydrate modified diet. CBG monitoring with regular insulin sliding scale.    GERD (gastroesophageal reflux disease) Protonix 40 mg p.o. daily.    Thrombocytopenia (HCC) Closely monitor platelet count while on heparin.    Hypokalemia Replaced. Magnesium was supplemented. Follow-up potassium level as needed.    Hypocalcemia  Check vitamin D level.    DVT prophylaxis: On heparin infusion. Code Status: Full code. Family Communication:  Disposition Plan: Observation for troponin level trending, serial EKGs and cardiology evaluation. Consults called: Routine  cardiology consult. Admission status: Observation/telemetry.   Bobette Mo MD [facility]  01/12/2019, 1:40 AM

## 2019-01-12 NOTE — Discharge Summary (Signed)
Physician Discharge Summary  Howard AROCHE Sr. NLZ:[id number] DOB: May 11, 1961 DOA: 01/11/2019  PCP: Richardean Chimera, MD  Admit date: 01/11/2019 Discharge date: 01/12/2019  Time spent: 30 minutes  Recommendations for Outpatient Follow-up:  1. Repeat basic metabolic panel to follow electrolytes and renal function 2. Follow CBG/A1c to further adjust hypoglycemic regimen as needed 3. Reassess blood pressure and further adjust antihypertensive medication as required.   Discharge Diagnoses:  Principal Problem:   Chest pain Active Problems:   Hyperlipidemia   TOBACCO ABUSE   Depression   Essential hypertension   CORONARY ATHEROSCLEROSIS NATIVE CORONARY ARTERY   Type 2 diabetes mellitus with complication (HCC)   GERD (gastroesophageal reflux disease)   Thrombocytopenia (HCC)   Hypokalemia   Hypocalcemia   Discharge Condition: Stable and improved.  Patient discharged home with instruction to follow-up with PCP in 10 days and with cardiology service in the next couple weeks.  Diet recommendation: Heart healthy diet and modify carbohydrates.  Filed Weights   01/11/19 2323  Weight: 83.9 kg    Brief history of present illness:  58 year old male with a past medical history of coronary artery disease status post stent; hypertension, hyperlipidemia, type 2 diabetes and tobacco abuse who presented to the ED secondary to chest pain.  Please refer to H&P written by Dr. Robb Matar on 01/12/2019 for further info/details on admission.  Hospital Course:  1-chest pain: Heart score 4-5 -Negative troponin x3 -Telemetry and EKG without acute ischemic changes -2D echo reassuring (preserved ejection fraction no wall motion normalities). -Patient is currently chest pain-free and decision has been made to reestablish medication management with outpatient follow-up for Myoview while on medical therapy in order to evaluate ischemic burden. -Patient educated about importance of medication compliance, was also  motivated to quit smoking. -Advised to follow heart healthy diet.  2-coronary artery disease status post stent intervention  -Chest pain-free at discharge -Will continue management with aspirin, statins, beta-blocker, ACE inhibitor and Imdur. -as mentioned above outpatient follow-up with cardiology service.  3-essential hypertension -Continue lisinopril and Toprol -Heart healthy diet has been recommended.  4-hyperlipidemia -Continue the use of Pravachol.  5-type 2 diabetes mellitus -Modified carbohydrate diet and resumption of oral hypoglycemic agents has been decided. -Outpatient follow-up with PCP to further adjust regimen as needed.  6-tobacco abuse -Cessation counseling has been provided -Nicotine patch prescribed at discharge.  Procedures:  See below for x-ray reports  2D echo:  1. The left ventricle has normal systolic function of 55-60%. The cavity size is normal. There is moderately increased left ventricular wall thickness. Indeterminate diastolic function.  2. The right ventricle has normal systolic function. The cavity in normal in size. There is no increase in right ventricular wall thickness. Right ventricular systolic pressure could not be assessed.  3. The aortic valve is tricuspid. There is mild aortic annular calcification noted.  4. The mitral valve is normal in structure.  5. The pulmonic valve is grossly normal.  6. The aortic root is normal in size and structure.  Consultations:  Cardiology service  Discharge Exam: Vitals:   01/12/19 1019 01/12/19 1423  BP: (!) 158/84 (!) 152/81  Pulse: 68   Resp:    Temp:  98.5 F (36.9 C)  SpO2:  100%    General: Afebrile, no chest pain, no nausea, no vomiting, no shortness of breath. Cardiovascular: S1 and S2, no rubs, no gallops, no JVD. Respiratory: Good air movement bilaterally, positive scattered rhonchi; no wheezing, no crackles. Abdomen: Soft, nontender, nondistended, positive bowel  sounds Extremities: No edema, no cyanosis.  Discharge Instructions   Discharge Instructions    Diet - low sodium heart healthy   Complete by:  As directed    Discharge instructions   Complete by:  As directed    Take medications as prescribed Follow heart healthy and modify carbohydrates diet Arrange follow-up with PCP in 10 days Follow-up with cardiology service as instructed (they will contact you with appointment details). -Stop smoking.   Increase activity slowly   Complete by:  As directed      Allergies as of 01/12/2019      Reactions   Ampicillin Other (See Comments)   REACTION: Headache, diarrhea   Other    HMG-COA Reductase Inhibitors - elevated liver tests   Penicillins Diarrhea   .Did it involve swelling of the face/tongue/throat, SOB, or low BP? No Did it involve sudden or severe rash/hives, skin peeling, or any reaction on the inside of your mouth or nose? No Did you need to seek medical attention at a hospital or doctor's office? No When did it last happen?Unknown If all above answers are "NO", may proceed with cephalosporin use.      Medication List    TAKE these medications   amitriptyline 25 MG tablet Commonly known as:  ELAVIL Take 2 tablets (50 mg total) by mouth at bedtime.   aspirin 81 MG EC tablet Take 1 tablet (81 mg total) by mouth daily. Start taking on:  January 13, 2019   CAL-MAG-ZINC PO Take 2 tablets by mouth at bedtime.   DULoxetine 60 MG capsule Commonly known as:  CYMBALTA Take 60 mg by mouth at bedtime.   fluticasone 50 MCG/ACT nasal spray Commonly known as:  FLONASE Place 2-3 sprays into both nostrils daily as needed (congestion).   gabapentin 400 MG capsule Commonly known as:  NEURONTIN Take 400 mg by mouth 3 (three) times daily.   glipiZIDE 10 MG 24 hr tablet Commonly known as:  GLUCOTROL XL Take 10 mg by mouth at bedtime.   isosorbide mononitrate 30 MG 24 hr tablet Commonly known as:  IMDUR Take 1 tablet (30 mg  total) by mouth daily. Start taking on:  January 13, 2019   lisinopril 2.5 MG tablet Commonly known as:  PRINIVIL,ZESTRIL Take 1 tablet (2.5 mg total) by mouth daily. Start taking on:  January 13, 2019 What changed:  when to take this   metoprolol succinate 25 MG 24 hr tablet Commonly known as:  TOPROL-XL Take 0.5 tablets (12.5 mg total) by mouth daily. Start taking on:  January 13, 2019 What changed:    how much to take  when to take this   nicotine 21 mg/24hr patch Commonly known as:  NICODERM CQ - dosed in mg/24 hours Place 1 patch (21 mg total) onto the skin daily.   nitroGLYCERIN 0.4 MG SL tablet Commonly known as:  NITROSTAT Place 0.4 mg under the tongue every 5 (five) minutes as needed for chest pain.   omeprazole 20 MG capsule Commonly known as:  PRILOSEC Take 20 mg by mouth 2 (two) times daily as needed (acid reflux).   oxycodone 30 MG immediate release tablet Commonly known as:  ROXICODONE Take 30 mg by mouth every 4 (four) hours. Scheduled per RX   pravastatin 40 MG tablet Commonly known as:  PRAVACHOL Take 1 tablet (40 mg total) by mouth daily. Start taking on:  January 13, 2019 What changed:    how much to take  how to take this  when to take this      Allergies  Allergen Reactions  . Ampicillin Other (See Comments)    REACTION: Headache, diarrhea  . Other     HMG-COA Reductase Inhibitors - elevated liver tests  . Penicillins Diarrhea    .Did it involve swelling of the face/tongue/throat, SOB, or low BP? No Did it involve sudden or severe rash/hives, skin peeling, or any reaction on the inside of your mouth or nose? No Did you need to seek medical attention at a hospital or doctor's office? No When did it last happen?Unknown If all above answers are "NO", may proceed with cephalosporin use.    Follow-up Information    Ellsworth LennoxStrader, Brittany M, PA-C Follow up on 01/28/2019.   Specialties:  Physician Assistant, Cardiology Why:  Cardiology  Hospital Follow-up on 01/28/2019 at 3:30PM.  Contact information: [address] [place] [address] [phone]        Richardean Chimeraaniel, Terry G, MD. Schedule an appointment as soon as possible for a visit in 10 day(s).   Specialty:  Family Medicine Contact information: [address] [place] [address] [phone]           The results of significant diagnostics from this hospitalization (including imaging, microbiology, ancillary and laboratory) are listed below for reference.    Significant Diagnostic Studies: Dg Chest 2 View  Result Date: 01/12/2019 CLINICAL DATA:  Mid chest pain for 3 days EXAM: CHEST - 2 VIEW COMPARISON:  07/01/2018 FINDINGS: The heart size and mediastinal contours are within normal limits. Both lungs are clear. The visualized skeletal structures are unremarkable. IMPRESSION: No active cardiopulmonary disease. Electronically Signed   By: Elige KoHetal  Patel   On: 01/12/2019 00:38   Labs: Basic Metabolic Panel: Recent Labs  Lab 01/11/19 2327  NA 137  K 3.2*  CL 107  CO2 23  GLUCOSE 299*  BUN 16  CREATININE 0.80  CALCIUM 8.4*  MG 1.9  PHOS 3.4   Liver Function Tests: Recent Labs  Lab 01/11/19 2327  AST 14*  ALT 17  ALKPHOS 75  BILITOT 0.4  PROT 6.8  ALBUMIN 3.9   Recent Labs  Lab 01/11/19 2327  LIPASE 41   CBC: Recent Labs  Lab 01/11/19 2327  WBC 6.1  HGB 14.3  HCT 43.3  MCV 92.1  PLT 107*   Cardiac Enzymes: Recent Labs  Lab 01/11/19 2327 01/12/19 0524 01/12/19 1044  TROPONINI <0.03 <0.03 <0.03    Signed:  Vassie Lollarlos [place] Kappes MD.  [facility] 01/12/2019, 3:12 PM

## 2019-01-12 NOTE — ED Notes (Signed)
Patient transported to X-ray 

## 2019-01-12 NOTE — Progress Notes (Signed)
*  PRELIMINARY RESULTS* Echocardiogram 2D Echocardiogram has been performed.  Howard Nelson 01/12/2019, 11:57 AM

## 2019-01-12 NOTE — Consult Note (Signed)
Cardiology Consultation:   Patient ID: [name]; [id number]; 11-26-1961   Admit date: 01/11/2019 Date of Consult: 01/12/2019  Primary Care Provider: Richardean Chimera, MD Primary Cardiologist: Dr. Dina Rich   Patient Profile:   Howard Nettles Sr. is a 58 y.o. male with a history of CAD status post stent intervention to the proximal and mid RCA in 2007, hypertension, hyperlipidemia, and type 2 diabetes mellitus who is being seen today for the evaluation of chest pain at the request of Dr. Gwenlyn Perking.  History of Present Illness:   [name] presents describing onset of right sided chest discomfort about 3 days ago while he was watching television.  He states that it is both sharp and burning, has been waxing and waning since that time.  He came to the hospital yesterday for evaluation and was admitted for observation.  Cardiac markers are negative for ACS and his ECG shows nonspecific T wave changes.  This morning he states that his symptoms have completely resolved.  He has not been seen in the cardiology clinic since 2018, a patient of Dr. Wyline Mood.  In talking with him in more detail, it is not clear that he has been on regular cardiac medications.  He does not report any progressing exertional chest pain.  He underwent diagnostic cardiac catheterization in February 2018 at which time he had mild to moderate nonobstructive CAD with patent stent sites in the proximal to mid RCA.  Medical therapy was recommended.  Past Medical History:  Diagnosis Date  . Chronic low back pain   . Coronary atherosclerosis of native coronary artery 2007   Stent intervention to proximal and mid RCA  . Depression   . Dyslipidemia   . Essential hypertension   . Fibromyalgia   . GERD (gastroesophageal reflux disease)   . Headache   . NSTEMI (non-ST elevated myocardial infarction) (HCC)   . Type 2 diabetes mellitus (HCC)     Past Surgical History:  Procedure Laterality Date  . APPENDECTOMY    .  CARDIAC CATHETERIZATION     [id number]  . CHOLECYSTECTOMY  2009  . CORONARY STENT PLACEMENT    . KNEE SURGERY Right   . LEFT HEART CATH AND CORONARY ANGIOGRAPHY N/A 01/30/2017   Procedure: Left Heart Cath and Coronary Angiography;  Surgeon: Yvonne Kendall, MD;  Location[facility] INVASIVE CV LAB;  Service: Cardiovascular;  Laterality: N/A;  . NASAL SINUS SURGERY    . SHOULDER SURGERY     [phone]  . SHOULDER SURGERY     R & L     Inpatient Medications: Scheduled Meds: . pantoprazole  40 mg Oral Daily   Continuous Infusions: . heparin 1,000 Units/hr (01/12/19 0600)   PRN Meds: acetaminophen **OR** acetaminophen, ALPRAZolam, prochlorperazine  Allergies:    Allergies  Allergen Reactions  . Ampicillin Other (See Comments)    REACTION: Headache, diarrhea  . Other     HMG-COA Reductase Inhibitors - elevated liver tests  . Penicillins Diarrhea    Social History:   Social History   Socioeconomic History  . Marital status: Married    Spouse name: Not on file  . Number of children: 3  . Years of education: 10  . Highest education level: Not on file  Occupational History  . Not on file  Social Needs  . Financial resource strain: Not on file  . Food insecurity:    Worry: Not on file    Inability: Not on file  . Transportation needs:  Medical: Not on file    Non-medical: Not on file  Tobacco Use  . Smoking status: Current Every Day Smoker    Packs/day: 1.00    Types: Cigarettes    Start date: 12/01/1972  . Smokeless tobacco: Never Used  . Tobacco comment: 1.5-2 packs per day  Substance and Sexual Activity  . Alcohol use: No    Alcohol/week: 0.0 standard drinks    Frequency: Never  . Drug use: No  . Sexual activity: Not on file  Lifestyle  . Physical activity:    Days per week: Not on file    Minutes per session: Not on file  . Stress: Not on file  Relationships  . Social connections:    Talks on phone: Not on file    Gets together: Not on file    Attends  religious service: Not on file    Active member of club or organization: Not on file    Attends meetings of clubs or organizations: Not on file    Relationship status: Not on file  . Intimate partner violence:    Fear of current or ex partner: Not on file    Emotionally abused: Not on file    Physically abused: Not on file    Forced sexual activity: Not on file  Other Topics Concern  . Not on file  Social History Narrative   Lives at home with his wife & 2 sons   Right handed   No caffeine    Family History:   The patient's family history includes Coronary artery disease in his father and mother; Diabetes in his father.  ROS:  Please see the history of present illness.  All other ROS reviewed and negative.     Physical Exam/Data:   Vitals:   01/12/19 0200 01/12/19 0248 01/12/19 0542 01/12/19 0942  BP: (!) 151/88 (!) 149/90 139/74 (!) 154/85  Pulse: 84 76 68 66  Resp: 16 14 18 18   Temp:  98 F (36.7 C) (!) 97.5 F (36.4 C) 97.8 F (36.6 C)  TempSrc:  Oral Oral Oral  SpO2: 90% 97% 98% 100%  Weight:      Height:        Intake/Output Summary (Last 24 hours) at 01/12/2019 1000 Last data filed at 01/12/2019 0900 Gross per 24 hour  Intake 549.95 ml  Output -  Net 549.95 ml   Filed Weights   01/11/19 2323  Weight: 83.9 kg   Body mass index is 26.17 kg/m.   Gen: Patient appears comfortable at rest. HEENT: Conjunctiva and lids normal, oropharynx clear. Neck: Supple, no elevated JVP or carotid bruits, no thyromegaly. Lungs: Clear to auscultation, nonlabored breathing at rest. Cardiac: Regular rate and rhythm, no S3 or significant systolic murmur, no pericardial rub. Abdomen: Soft, nontender, bowel sounds present, no guarding or rebound. Extremities: No pitting edema, distal pulses 2+. Skin: Warm and dry. Musculoskeletal: No kyphosis. Neuropsychiatric: Alert and oriented x3, affect grossly appropriate.  EKG:  I personally reviewed the tracing from 01/11/2019 which  showed sinus rhythm with low voltage and nonspecific T wave changes.  Telemetry:  I personally reviewed telemetry which shows sinus rhythm.  Relevant CV Studies:  Cardiac catheterization 01/30/2017: Conclusions: 1. Mild to moderate, non-obstructive CAD involving the major epicardial coronary arteries. There is tapering of the distal branches, likely representing small vessel disease in the setting of diabetes and tobacco use. 2. Patent stents in the proximal and mid RCA with minimal in-stent restenosis. 3. Normal left ventricular contraction  and filling pressure.  Echocardiogram 01/30/2017: Study Conclusions  - Left ventricle: The cavity size was normal. Wall thickness was   normal. Systolic function was normal. The estimated ejection   fraction was in the range of 55% to 60%. Wall motion was normal;   there were no regional wall motion abnormalities. Left   ventricular diastolic function parameters were normal.  Laboratory Data:  Chemistry Recent Labs  Lab 01/11/19 2327  NA 137  K 3.2*  CL 107  CO2 23  GLUCOSE 299*  BUN 16  CREATININE 0.80  CALCIUM 8.4*  GFRNONAA >60  GFRAA >60  ANIONGAP 7    Recent Labs  Lab 01/11/19 2327  PROT 6.8  ALBUMIN 3.9  AST 14*  ALT 17  ALKPHOS 75  BILITOT 0.4   Hematology Recent Labs  Lab 01/11/19 2327  WBC 6.1  RBC 4.70  HGB 14.3  HCT 43.3  MCV 92.1  MCH 30.4  MCHC 33.0  RDW 12.3  PLT 107*   Cardiac Enzymes Recent Labs  Lab 01/11/19 2327 01/12/19 0524  TROPONINI <0.03 <0.03   No results for input(s): TROPIPOC in the last 168 hours.   Radiology/Studies:  Dg Chest 2 View  Result Date: 01/12/2019 CLINICAL DATA:  Mid chest pain for 3 days EXAM: CHEST - 2 VIEW COMPARISON:  07/01/2018 FINDINGS: The heart size and mediastinal contours are within normal limits. Both lungs are clear. The visualized skeletal structures are unremarkable. IMPRESSION: No active cardiopulmonary disease. Electronically Signed   By: Elige Ko    On: 01/12/2019 00:38    Assessment and Plan:   1.  Prolonged episode of chest pain in a 58 year old male with known CAD, mild to moderate nonobstructive disease with patent stent sites in the proximal and mid RCA documented as of 2018.  Cardiac markers argue against ACS and ECG is nonspecific.  2.  CAD status post stent intervention to the proximal and mid RCA in 2007.  3.  Mixed hyperlipidemia, on Pravachol.  4.  Essential hypertension, on lisinopril and Toprol-XL.  5.  Tobacco abuse, smoking cessation discussed.  I reviewed the patient's history and discussed his prior follow-up.  It was not entirely clear to me whether he was taking regular cardiac medications per our discussion, he could not recall the list.  I reinstituted his previous stable cardiac regimen including aspirin, Toprol-XL, lisinopril, Imdur, and Pravachol.  Would go ahead and stop heparin.  Have him ambulate today and observe.  If no further chest pain, he could be considered for discharge later this afternoon with plan to follow-up in our [place] office with Dr. Wyline Mood or APP in the next few weeks and at that time consideration can be given to a follow-up Myoview on medical therapy to evaluate ischemic burden.  If he continues to have chest discomfort however, we will need to proceed with further inpatient ischemic work-up.  Signed, Nona Dell, MD  01/12/2019 10:00 AM

## 2019-01-12 NOTE — Progress Notes (Signed)
ANTICOAGULATION CONSULT NOTE - Preliminary  Pharmacy Consult for Heparin Indication: ACS/STEMI  Allergies  Allergen Reactions  . Ampicillin Other (See Comments)    REACTION: Headache, diarrhea  . Other     HMG-COA Reductase Inhibitors - elevated liver tests  . Penicillins Diarrhea    Patient Measurements: Height: 5' 10.5" (179.1 cm) Weight: 185 lb (83.9 kg) IBW/kg (Calculated) : 74.15 HEPARIN DW (KG): 83.9   Vital Signs: Temp: 98 F (36.7 C) (02/04 2325) Temp Source: Oral (02/04 2325) BP: 159/86 (02/05 0100) Pulse Rate: 92 (02/05 0100)  Labs: Recent Labs    01/11/19 2327  HGB 14.3  HCT 43.3  PLT 107*  CREATININE 0.80  TROPONINI <0.03   Estimated Creatinine Clearance: 104.3 mL/min (by C-G formula based on SCr of 0.8 mg/dL).  Medical History: Past Medical History:  Diagnosis Date  . Chest pain   . Chronic low back pain   . Coronary atherosclerosis of native coronary artery 2009   stent  . Depression   . Diabetes mellitus (HCC)   . Dyslipidemia   . Fibromyalgia   . GERD (gastroesophageal reflux disease)   . Headache   . Heart attack (HCC)    three times  . HTN (hypertension)   . NSTEMI (non-ST elevated myocardial infarction) (HCC)     Medications:   Assessment: 58 yo male presented to the ED for 3 day history of SOB and intermittent chest pain. Pt has history of CAD with stents. Pharmacy has been consulted for IV heparin dosing.  Goal of Therapy:  Heparin level 0.3-0.7 units/ml Monitor platelets by anticoagulation protocol: Yes  Plan:  Heparin 4000 unit bolus Heparin infusion at 1000 units/hr Heparin level in 6-8 hrs  Preliminary review of pertinent patient information completed.  Jeani HawkingAnnie Penn clinical pharmacist will complete review during morning rounds to assess the patient and finalize treatment regimen.  Arelia SneddonMason, Oskar Cretella Anne, [facility] HospitalRPH 01/12/2019,1:45 AM

## 2019-01-12 NOTE — Care Management Obs Status (Signed)
MEDICARE OBSERVATION STATUS NOTIFICATION   Patient Details  Name: Howard GARG Sr. MRN: [id number] Date of Birth: 16-Jun-1961   Medicare Observation Status Notification Given:  Yes    Renie Ora 01/12/2019, 2:37 PM

## 2019-01-13 LAB — VITAMIN D 25 HYDROXY (VIT D DEFICIENCY, FRACTURES): Vit D, 25-Hydroxy: 13.4 ng/mL — ABNORMAL LOW (ref 30.0–100.0)

## 2019-01-25 NOTE — Progress Notes (Signed)
Post discharge lab work follow up.  I left a message for Howard Nelson at [phone] to increase his vitamin D intake and follow up with his primary care provider.  Sanda Klein, MD

## 2019-01-28 ENCOUNTER — Encounter: Payer: Self-pay | Admitting: Student

## 2019-01-28 ENCOUNTER — Ambulatory Visit (INDEPENDENT_AMBULATORY_CARE_PROVIDER_SITE_OTHER): Payer: Medicare Other | Admitting: Student

## 2019-01-28 VITALS — BP 124/64 | HR 88 | Ht 70.5 in | Wt 189.0 lb

## 2019-01-28 DIAGNOSIS — I2510 Atherosclerotic heart disease of native coronary artery without angina pectoris: Secondary | ICD-10-CM | POA: Diagnosis not present

## 2019-01-28 DIAGNOSIS — E785 Hyperlipidemia, unspecified: Secondary | ICD-10-CM

## 2019-01-28 DIAGNOSIS — I200 Unstable angina: Secondary | ICD-10-CM

## 2019-01-28 DIAGNOSIS — I10 Essential (primary) hypertension: Secondary | ICD-10-CM

## 2019-01-28 DIAGNOSIS — Z720 Tobacco use: Secondary | ICD-10-CM | POA: Diagnosis not present

## 2019-01-28 MED ORDER — METOPROLOL SUCCINATE ER 25 MG PO TB24: 12.5000 mg | ORAL_TABLET | Freq: Every day | ORAL | 3 refills | Status: DC

## 2019-01-28 MED ORDER — LISINOPRIL 2.5 MG PO TABS: 2.5000 mg | ORAL_TABLET | Freq: Every day | ORAL | 3 refills | Status: AC

## 2019-01-28 MED ORDER — PRAVASTATIN SODIUM 40 MG PO TABS: 40.0000 mg | ORAL_TABLET | Freq: Every day | ORAL | 3 refills | Status: DC

## 2019-01-28 MED ORDER — ISOSORBIDE MONONITRATE ER 30 MG PO TB24: 30.0000 mg | ORAL_TABLET | Freq: Every day | ORAL | 3 refills | Status: DC

## 2019-01-28 NOTE — Progress Notes (Signed)
Cardiology Office Note    Date:  01/28/2019   ID:  Howard FENOGLIO Sr., DOB Dec 15, 1960, MRN [id number]  PCP:  Richardean Chimera, MD  Cardiologist: Dina Rich, MD    Chief Complaint  Patient presents with  . Hospitalization Follow-up    History of Present Illness:    Howard Northup. is a 58 y.o. male with past medical history of CAD (s/p stenting to proximal and mid-RCA in 2007, patent stents by cath in 01/2017), HTN, HLD, Type 2 DM, and continued tobacco use who presents to the office today for hospital follow-up.   He was most recently admitted to [facility] on 01/11/2019 for evaluation of right-sided chest discomfort which he described as a sharp sensation which would occur at rest or with activity. Cyclic troponin values remained negative and EKG showed no acute ischemic changes. It was unclear if he was taking any medications regularly at that time and he was restarted on ASA, Toprol-XL, Lisinopril, Imdur, and Pravachol. Close follow-up was arranged for if he had recurrent symptoms, stress testing could be considered for further evaluation.  In talking with the patient today, he reports overall doing well from a cardiac perspective since his recent hospitalization. He denies any recurrent episodes of chest pain. No recent dyspnea on exertion, orthopnea, PND, lower extremity edema, or palpitations. He does not exercise regularly but reports walking his 2 dogs 4 times per day without any anginal symptoms.  Reports good compliance with his current medication regimen. Says he had previously stopped most of his cardiac medications in the past due to one of them causing a worsening headache but Howard been able to tolerate his current medications without any noted side effects.  Past Medical History:  Diagnosis Date  . Chronic low back pain   . Coronary atherosclerosis of native coronary artery 2007   a. s/p stenting to proximal and mid-RCA in 2007 b. patent stents by cath in 01/2017  .  Depression   . Dyslipidemia   . Essential hypertension   . Fibromyalgia   . GERD (gastroesophageal reflux disease)   . Headache   . NSTEMI (non-ST elevated myocardial infarction) (HCC)   . Type 2 diabetes mellitus (HCC)     Past Surgical History:  Procedure Laterality Date  . APPENDECTOMY    . CARDIAC CATHETERIZATION     [id number]  . CHOLECYSTECTOMY  2009  . CORONARY STENT PLACEMENT    . KNEE SURGERY Right   . LEFT HEART CATH AND CORONARY ANGIOGRAPHY N/A 01/30/2017   Procedure: Left Heart Cath and Coronary Angiography;  Surgeon: Yvonne Kendall, MD;  Location: [facility] INVASIVE CV LAB;  Service: Cardiovascular;  Laterality: N/A;  . NASAL SINUS SURGERY    . SHOULDER SURGERY     [phone]  . SHOULDER SURGERY     R & L    Current Medications: Outpatient Medications Prior to Visit  Medication Sig Dispense Refill  . amitriptyline (ELAVIL) 25 MG tablet Take 2 tablets (50 mg total) by mouth at bedtime. 180 tablet 0  . aspirin EC 81 MG EC tablet Take 1 tablet (81 mg total) by mouth daily. 30 tablet 3  . Calcium-Magnesium-Zinc (CAL-MAG-ZINC PO) Take 2 tablets by mouth at bedtime.     . DULoxetine (CYMBALTA) 60 MG capsule Take 60 mg by mouth at bedtime.     . fluticasone (FLONASE) 50 MCG/ACT nasal spray Place 2-3 sprays into both nostrils daily as needed (congestion).     . gabapentin (NEURONTIN) 400  MG capsule Take 400 mg by mouth 3 (three) times daily.    Marland Kitchen glipiZIDE (GLUCOTROL XL) 10 MG 24 hr tablet Take 10 mg by mouth at bedtime.    . nitroGLYCERIN (NITROSTAT) 0.4 MG SL tablet Place 0.4 mg under the tongue every 5 (five) minutes as needed for chest pain.     Marland Kitchen omeprazole (PRILOSEC) 20 MG capsule Take 20 mg by mouth 2 (two) times daily as needed (acid reflux).     Marland Kitchen oxycodone (ROXICODONE) 30 MG immediate release tablet Take 30 mg by mouth every 4 (four) hours. Scheduled per RX    . isosorbide mononitrate (IMDUR) 30 MG 24 hr tablet Take 1 tablet (30 mg total) by mouth daily. 30 tablet 3  .  lisinopril (PRINIVIL,ZESTRIL) 2.5 MG tablet Take 1 tablet (2.5 mg total) by mouth daily. 30 tablet 3  . metoprolol succinate (TOPROL-XL) 25 MG 24 hr tablet Take 0.5 tablets (12.5 mg total) by mouth daily. 30 tablet 3  . nicotine (NICODERM CQ - DOSED IN MG/24 HOURS) 21 mg/24hr patch Place 1 patch (21 mg total) onto the skin daily. 30 patch 2  . pravastatin (PRAVACHOL) 40 MG tablet Take 1 tablet (40 mg total) by mouth daily. 30 tablet 3   No facility-administered medications prior to visit.      Allergies:   Ampicillin; Other; and Penicillins   Social History   Socioeconomic History  . Marital status: Married    Spouse name: Not on file  . Number of children: 3  . Years of education: 10  . Highest education level: Not on file  Occupational History  . Not on file  Social Needs  . Financial resource strain: Not on file  . Food insecurity:    Worry: Not on file    Inability: Not on file  . Transportation needs:    Medical: Not on file    Non-medical: Not on file  Tobacco Use  . Smoking status: Current Every Day Smoker    Packs/day: 1.00    Types: Cigarettes    Start date: 12/01/1972  . Smokeless tobacco: Never Used  . Tobacco comment: 1.5-2 packs per day  Substance and Sexual Activity  . Alcohol use: No    Alcohol/week: 0.0 standard drinks    Frequency: Never  . Drug use: No  . Sexual activity: Not on file  Lifestyle  . Physical activity:    Days per week: Not on file    Minutes per session: Not on file  . Stress: Not on file  Relationships  . Social connections:    Talks on phone: Not on file    Gets together: Not on file    Attends religious service: Not on file    Active member of club or organization: Not on file    Attends meetings of clubs or organizations: Not on file    Relationship status: Not on file  Other Topics Concern  . Not on file  Social History Narrative   Lives at home with his wife & 2 sons   Right handed   No caffeine     Family History:   The patient's family history includes Coronary artery disease in his father and mother; Diabetes in his father.   Review of Systems:   Please see the history of present illness.     General:  No chills, fever, night sweats or weight changes.  Cardiovascular:  No dyspnea on exertion, edema, orthopnea, palpitations, paroxysmal nocturnal dyspnea. Positive for chest pain (  now resolved).  Dermatological: No rash, lesions/masses Respiratory: No cough, dyspnea Urologic: No hematuria, dysuria Abdominal:   No nausea, vomiting, diarrhea, bright red blood per rectum, melena, or hematemesis Neurologic:  No visual changes, wkns, changes in mental status. All other systems reviewed and are otherwise negative except as noted above.   Physical Exam:    VS:  BP 124/64   Pulse 88   Ht 5' 10.5" (1.791 m)   Wt 189 lb (85.7 kg)   SpO2 96%   BMI 26.74 kg/m    General: Well developed, well nourished Caucasian male appearing in no acute distress. Head: Normocephalic, atraumatic, sclera non-icteric, no xanthomas, nares are without discharge.  Neck: No carotid bruits. JVD not elevated.  Lungs: Respirations regular and unlabored, without wheezes or rales.  Heart: Regular rate and rhythm. No S3 or S4.  No murmur, no rubs, or gallops appreciated. Abdomen: Soft, non-tender, non-distended with normoactive bowel sounds. No hepatomegaly. No rebound/guarding. No obvious abdominal masses. Msk:  Strength and tone appear normal for age. No joint deformities or effusions. Extremities: No clubbing or cyanosis. No lower extremity edema.  Distal pedal pulses are 2+ bilaterally. Neuro: Alert and oriented X 3. Moves all extremities spontaneously. No focal deficits noted. Psych:  Responds to questions appropriately with a normal affect. Skin: No rashes or lesions noted  Wt Readings from Last 3 Encounters:  01/28/19 189 lb (85.7 kg)  01/11/19 185 lb (83.9 kg)  01/14/18 207 lb (93.9 kg)     Studies/Labs Reviewed:    EKG:  EKG is not ordered today.   Recent Labs: 01/11/2019: ALT 17; BUN 16; Creatinine, Ser 0.80; Hemoglobin 14.3; Magnesium 1.9; Platelets 107; Potassium 3.2; Sodium 137   Lipid Panel    Component Value Date/Time   CHOL 213 (H) 01/29/2017 2322   TRIG 806 (H) 01/29/2017 2322   HDL 29 (L) 01/29/2017 2322   CHOLHDL 7.3 01/29/2017 2322   VLDL UNABLE TO CALCULATE IF TRIGLYCERIDE OVER 400 mg/dL 16/10/960402/22/2018 [id number]   LDLCALC UNABLE TO CALCULATE IF TRIGLYCERIDE OVER 400 mg/dL 81/19/147802/22/2018 [id number]    Additional studies/ records that were reviewed today include:   Cardiac Catheterization: 01/2017 Conclusions: 1. Mild to moderate, non-obstructive CAD involving the major epicardial coronary arteries. There is tapering of the distal branches, likely representing small vessel disease in the setting of diabetes and tobacco use. 2. Patent stents in the proximal and mid RCA with minimal in-stent restenosis. 3. Normal left ventricular contraction and filling pressure.  Recommendations: 1. Aggressive secondary prevention and medical therapy; will add isosorbide mononitrate for small vessel disease. 2. If chest pain Howard resolved, could consider discharge tonight after post-cath monitoring is complete or tomorrow.  Echocardiogram: 01/12/2019 IMPRESSIONS   1. The left ventricle Howard normal systolic function of 55-60%. The cavity size is normal. There is moderately increased left ventricular wall thickness. Indeterminate diastolic function.  2. The right ventricle Howard normal systolic function. The cavity in normal in size. There is no increase in right ventricular wall thickness. Right ventricular systolic pressure could not be assessed.  3. The aortic valve is tricuspid. There is mild aortic annular calcification noted.  4. The mitral valve is normal in structure.  5. The pulmonic valve is grossly normal.  6. The aortic root is normal in size and structure.  Assessment:    1. Coronary artery disease  involving native coronary artery of native heart without angina pectoris   2. Essential hypertension   3. Hyperlipidemia LDL goal <70   4. Tobacco  abuse      Plan:   In order of problems listed above:  1. CAD - s/p stenting to proximal and mid-RCA in 2007 with patent stents by cath in 01/2017 as outlined above.  Recent echocardiogram showed a preserved EF of 55 to 60% with no regional wall motion abnormalities as outlined above. - He denies any recurrent chest pain or dyspnea on exertion since hospital discharge. Howard Nelson without any recurrent anginal symptoms. - Will continue with medical management for now including ASA, Imdur, beta-blocker, and statin therapy. I encouraged him to make [place] aware if he develops any recurrent symptoms as repeat stress testing could be pursued at that time.   2. HTN - BP is well controlled at 124/64 during today's visit. Continue Imdur 30 mg daily, Lisinopril 2.5 mg daily, and Toprol-XL 12.5 mg daily.  3. HLD - Followed by PCP. Goal LDL is less than 70 in the setting of known CAD. He remains on Pravastatin 40 mg daily.  4. Tobacco Use - He was previously smoking 2 ppd but reduced his use to 1 ppd within the past few years. Howard not made any further reductions and not interested in quitting at this time.    Medication Adjustments/Labs and Tests Ordered: Current medicines are reviewed at length with the patient today.  Concerns regarding medicines are outlined above.  Medication changes, Labs and Tests ordered today are listed in the Patient Instructions below. Patient Instructions  Medication Instructions:  Your physician recommends that you continue on your current medications as directed. Please refer to the Current Medication list given to you today.  If you need a refill on your cardiac medications before your next appointment, please call your pharmacy.   Lab work: None today If you have labs (blood work)  drawn today and your tests are completely normal, you will receive your results only by: Marland Kitchen MyChart Message (if you have MyChart) OR . A paper copy in the mail If you have any lab test that is abnormal or we need to change your treatment, we will call you to review the results.  Testing/Procedures: None today  Follow-Up: At [facility], you and your health needs are our priority.  As part of our continuing mission to provide you with exceptional heart care, we have created designated Provider Care Teams.  These Care Teams include your primary Cardiologist (physician) and Advanced Practice Providers (APPs -  Physician Assistants and Nurse Practitioners) who all work together to provide you with the care you need, when you need it. You will need a follow up appointment in 6 months.  Please call our office 2 months in advance to schedule this appointment.  You may see Dina Rich, MD or one of the following Advanced Practice Providers on your designated Care Team:   Randall An, PA-C [facility]) . Jacolyn Reedy, PA-C [facility] Office)  Any Other Special Instructions Will Be Listed Below (If Applicable). NONE    Signed, Ellsworth Lennox, PA-C  01/28/2019 4:29 PM    [facility] [address] [place], [place] [address] Phone: [phone] Fax: [phone]

## 2019-01-28 NOTE — Patient Instructions (Signed)
Medication Instructions:  Your physician recommends that you continue on your current medications as directed. Please refer to the Current Medication list given to you today.  If you need a refill on your cardiac medications before your next appointment, please call your pharmacy.   Lab work: None today If you have labs (blood work) drawn today and your tests are completely normal, you will receive your results only by: Marland Kitchen MyChart Message (if you have MyChart) OR . A paper copy in the mail If you have any lab test that is abnormal or we need to change your treatment, we will call you to review the results.  Testing/Procedures: None today  Follow-Up: At [facility], you and your health needs are our priority.  As part of our continuing mission to provide you with exceptional heart care, we have created designated Provider Care Teams.  These Care Teams include your primary Cardiologist (physician) and Advanced Practice Providers (APPs -  Physician Assistants and Nurse Practitioners) who all work together to provide you with the care you need, when you need it. You will need a follow up appointment in 6 months.  Please call our office 2 months in advance to schedule this appointment.  You may see Dina Rich, MD or one of the following Advanced Practice Providers on your designated Care Team:   Randall An, PA-C [facility]) . Jacolyn Reedy, PA-C [facility])  Any Other Special Instructions Will Be Listed Below (If Applicable). NONE

## 2019-02-01 DIAGNOSIS — G894 Chronic pain syndrome: Secondary | ICD-10-CM | POA: Diagnosis not present

## 2019-02-01 DIAGNOSIS — Z79899 Other long term (current) drug therapy: Secondary | ICD-10-CM | POA: Diagnosis not present

## 2019-02-01 DIAGNOSIS — M5136 Other intervertebral disc degeneration, lumbar region: Secondary | ICD-10-CM | POA: Diagnosis not present

## 2019-02-01 DIAGNOSIS — I10 Essential (primary) hypertension: Secondary | ICD-10-CM | POA: Diagnosis not present

## 2019-02-08 DIAGNOSIS — N529 Male erectile dysfunction, unspecified: Secondary | ICD-10-CM | POA: Diagnosis not present

## 2019-02-08 DIAGNOSIS — Z6827 Body mass index (BMI) 27.0-27.9, adult: Secondary | ICD-10-CM | POA: Diagnosis not present

## 2019-02-08 DIAGNOSIS — E039 Hypothyroidism, unspecified: Secondary | ICD-10-CM | POA: Diagnosis not present

## 2019-02-08 DIAGNOSIS — B353 Tinea pedis: Secondary | ICD-10-CM | POA: Diagnosis not present

## 2019-02-11 DIAGNOSIS — I10 Essential (primary) hypertension: Secondary | ICD-10-CM | POA: Diagnosis not present

## 2019-02-11 DIAGNOSIS — E782 Mixed hyperlipidemia: Secondary | ICD-10-CM | POA: Diagnosis not present

## 2019-02-11 DIAGNOSIS — I2510 Atherosclerotic heart disease of native coronary artery without angina pectoris: Secondary | ICD-10-CM | POA: Diagnosis not present

## 2019-02-23 DIAGNOSIS — Z6826 Body mass index (BMI) 26.0-26.9, adult: Secondary | ICD-10-CM | POA: Diagnosis not present

## 2019-02-23 DIAGNOSIS — B353 Tinea pedis: Secondary | ICD-10-CM | POA: Diagnosis not present

## 2019-02-23 DIAGNOSIS — N529 Male erectile dysfunction, unspecified: Secondary | ICD-10-CM | POA: Diagnosis not present

## 2019-03-02 DIAGNOSIS — Z79899 Other long term (current) drug therapy: Secondary | ICD-10-CM | POA: Diagnosis not present

## 2019-03-02 DIAGNOSIS — G894 Chronic pain syndrome: Secondary | ICD-10-CM | POA: Diagnosis not present

## 2019-03-02 DIAGNOSIS — M5136 Other intervertebral disc degeneration, lumbar region: Secondary | ICD-10-CM | POA: Diagnosis not present

## 2019-03-07 ENCOUNTER — Telehealth: Payer: Self-pay | Admitting: Cardiology

## 2019-03-07 DIAGNOSIS — I10 Essential (primary) hypertension: Secondary | ICD-10-CM | POA: Diagnosis not present

## 2019-03-07 DIAGNOSIS — E782 Mixed hyperlipidemia: Secondary | ICD-10-CM | POA: Diagnosis not present

## 2019-03-07 NOTE — Telephone Encounter (Signed)
Spoke with pt who states that when he takes Imdur he has to also take Tylenol and lay down. Please advise.

## 2019-03-07 NOTE — Telephone Encounter (Signed)
Pt states that the isosorbide mononitrate (IMDUR) 30 MG 24 hr tablet [[id number]]  Is giving him really bad headaches, please give pt a call @ [phone]

## 2019-03-09 NOTE — Telephone Encounter (Signed)
Called pt. No answer. Left msg to call back.  

## 2019-03-09 NOTE — Telephone Encounter (Signed)
Lower imdur to 15mg  daily and update [place] in a few days   Dominga Ferry MD

## 2019-03-10 NOTE — Telephone Encounter (Signed)
I spoke with patient, he will decrease Imdur to 15 mg for a few days and see if HA resolves.I asked him to call us back with update, he agreed.

## 2019-03-17 DIAGNOSIS — N529 Male erectile dysfunction, unspecified: Secondary | ICD-10-CM | POA: Diagnosis not present

## 2019-03-17 DIAGNOSIS — M797 Fibromyalgia: Secondary | ICD-10-CM | POA: Diagnosis not present

## 2019-03-17 DIAGNOSIS — I10 Essential (primary) hypertension: Secondary | ICD-10-CM | POA: Diagnosis not present

## 2019-03-17 DIAGNOSIS — Z79891 Long term (current) use of opiate analgesic: Secondary | ICD-10-CM | POA: Diagnosis not present

## 2019-03-17 DIAGNOSIS — E1165 Type 2 diabetes mellitus with hyperglycemia: Secondary | ICD-10-CM | POA: Diagnosis not present

## 2019-03-17 DIAGNOSIS — K210 Gastro-esophageal reflux disease with esophagitis: Secondary | ICD-10-CM | POA: Diagnosis not present

## 2019-03-17 DIAGNOSIS — F17210 Nicotine dependence, cigarettes, uncomplicated: Secondary | ICD-10-CM | POA: Diagnosis not present

## 2019-03-17 DIAGNOSIS — E782 Mixed hyperlipidemia: Secondary | ICD-10-CM | POA: Diagnosis not present

## 2019-03-17 DIAGNOSIS — E8881 Metabolic syndrome: Secondary | ICD-10-CM | POA: Diagnosis not present

## 2019-03-23 DIAGNOSIS — F17210 Nicotine dependence, cigarettes, uncomplicated: Secondary | ICD-10-CM | POA: Diagnosis not present

## 2019-03-23 DIAGNOSIS — E782 Mixed hyperlipidemia: Secondary | ICD-10-CM | POA: Diagnosis not present

## 2019-03-23 DIAGNOSIS — M797 Fibromyalgia: Secondary | ICD-10-CM | POA: Diagnosis not present

## 2019-03-23 DIAGNOSIS — E8881 Metabolic syndrome: Secondary | ICD-10-CM | POA: Diagnosis not present

## 2019-03-23 DIAGNOSIS — I25111 Atherosclerotic heart disease of native coronary artery with angina pectoris with documented spasm: Secondary | ICD-10-CM | POA: Diagnosis not present

## 2019-03-23 DIAGNOSIS — F331 Major depressive disorder, recurrent, moderate: Secondary | ICD-10-CM | POA: Diagnosis not present

## 2019-03-23 DIAGNOSIS — M545 Low back pain: Secondary | ICD-10-CM | POA: Diagnosis not present

## 2019-03-23 DIAGNOSIS — M5136 Other intervertebral disc degeneration, lumbar region: Secondary | ICD-10-CM | POA: Diagnosis not present

## 2019-03-31 DIAGNOSIS — G894 Chronic pain syndrome: Secondary | ICD-10-CM | POA: Diagnosis not present

## 2019-03-31 DIAGNOSIS — M5136 Other intervertebral disc degeneration, lumbar region: Secondary | ICD-10-CM | POA: Diagnosis not present

## 2019-03-31 DIAGNOSIS — Z79899 Other long term (current) drug therapy: Secondary | ICD-10-CM | POA: Diagnosis not present

## 2019-04-28 DIAGNOSIS — G894 Chronic pain syndrome: Secondary | ICD-10-CM | POA: Diagnosis not present

## 2019-04-28 DIAGNOSIS — M5136 Other intervertebral disc degeneration, lumbar region: Secondary | ICD-10-CM | POA: Diagnosis not present

## 2019-04-28 DIAGNOSIS — Z79899 Other long term (current) drug therapy: Secondary | ICD-10-CM | POA: Diagnosis not present

## 2019-05-06 IMAGING — DX DG CHEST 2V
2 series · 2 of 2 positions shown · non-contrast
Comparison: 07/01/2018

CLINICAL DATA: Mid chest pain for 3 days

EXAM:
CHEST - 2 VIEW

[chest pa]
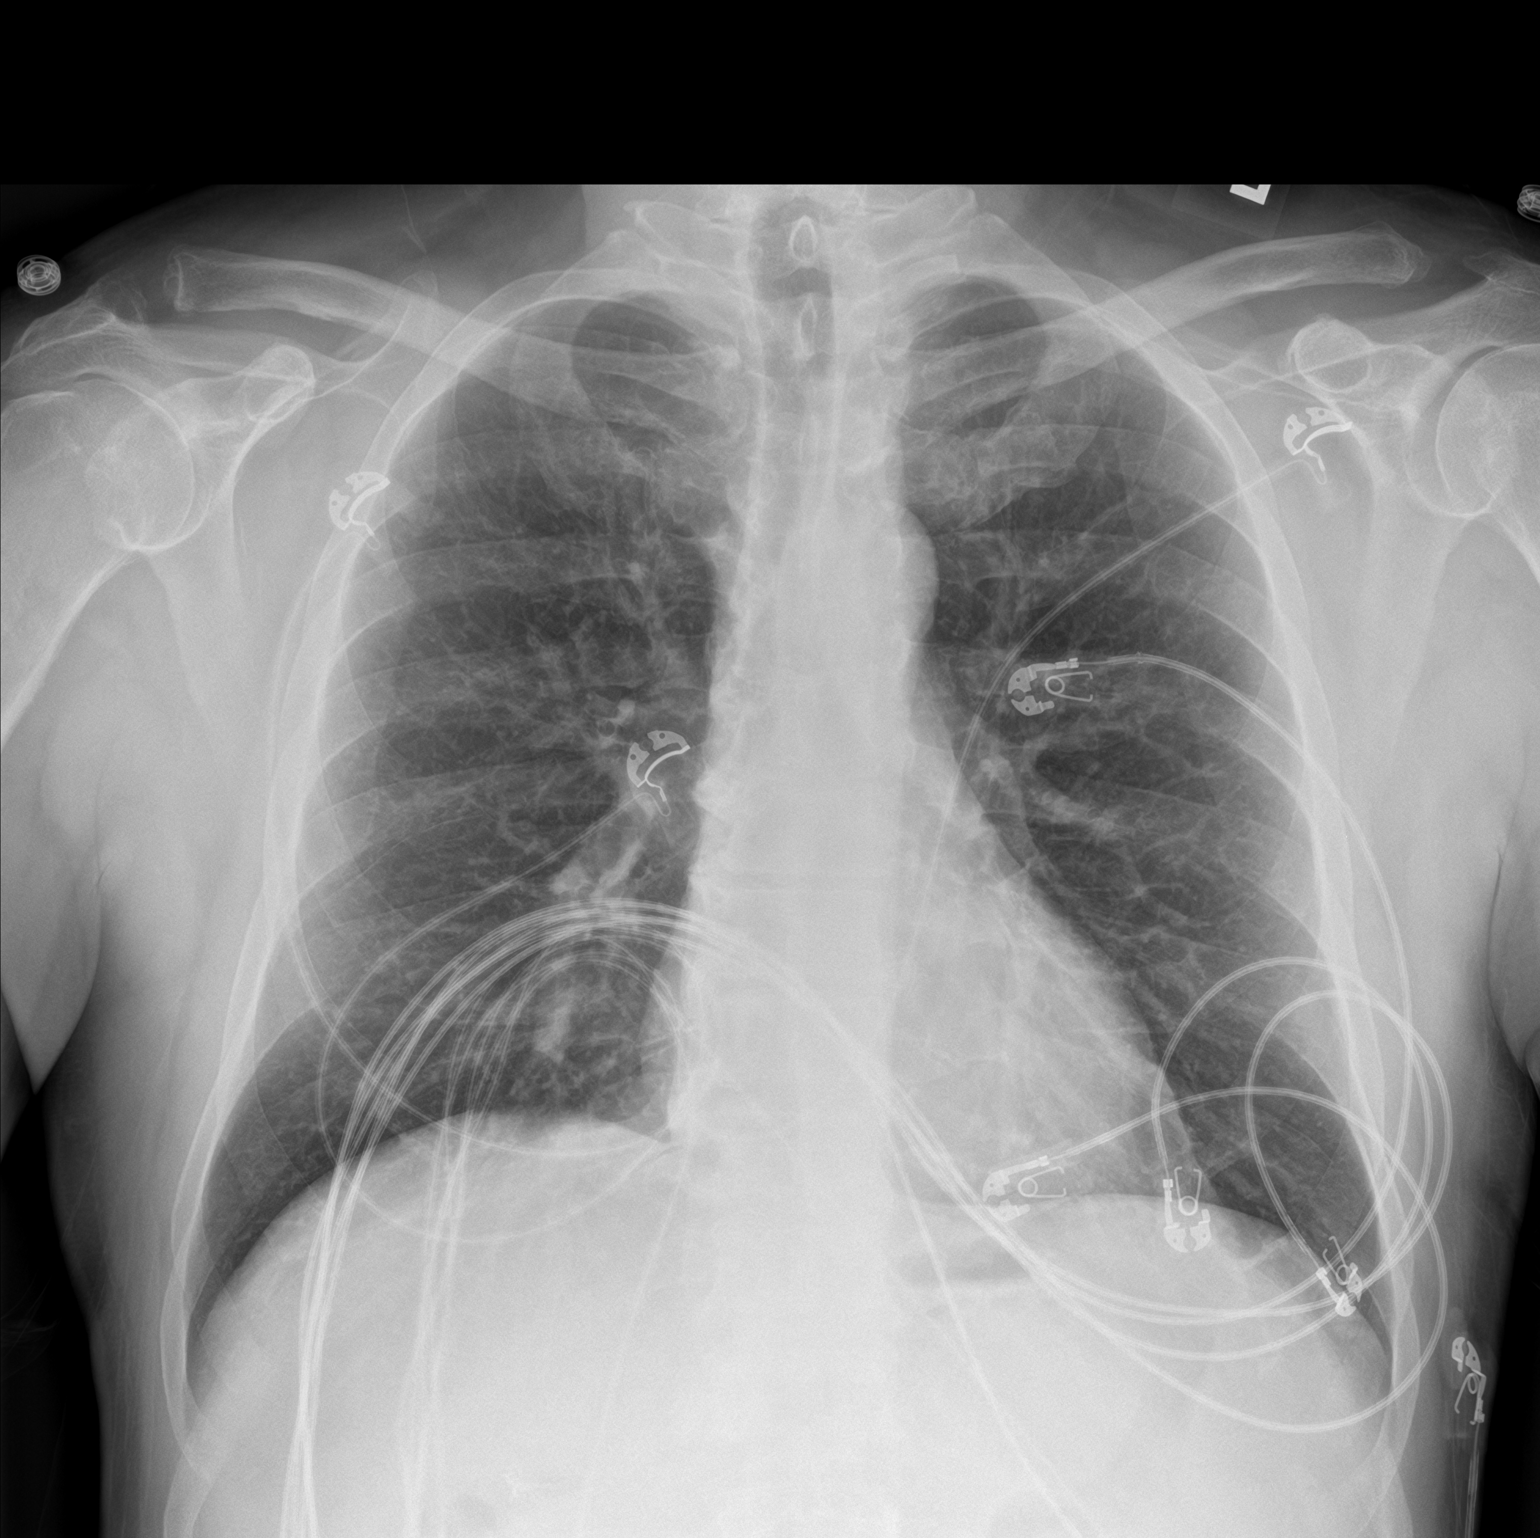

[chest lat]
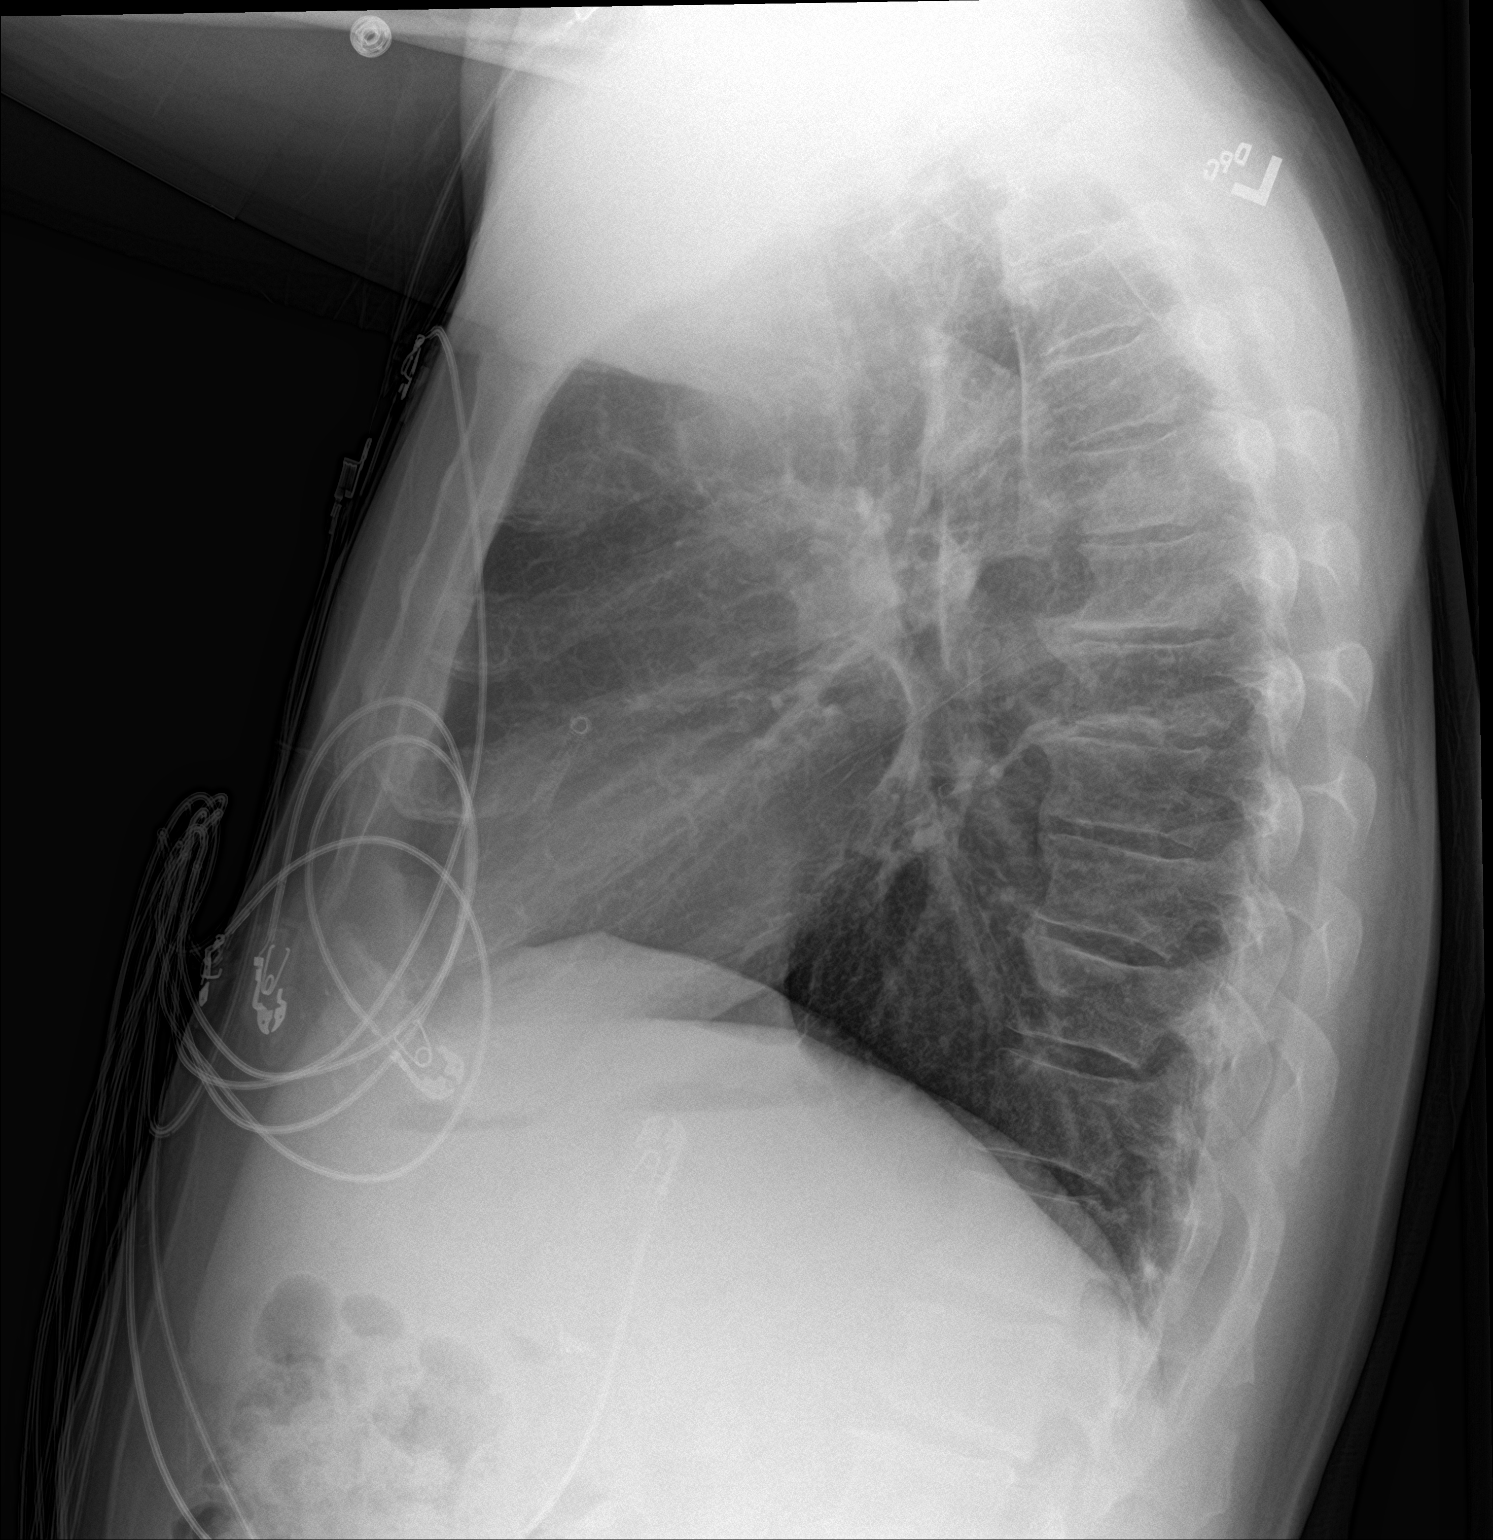

[2 of 2 positions shown; findings below may reference images not displayed]

FINDINGS: The heart size and mediastinal contours are within normal limits.
Both lungs are clear. The visualized skeletal structures are
unremarkable.
IMPRESSION: No active cardiopulmonary disease.

## 2019-05-26 DIAGNOSIS — Z79899 Other long term (current) drug therapy: Secondary | ICD-10-CM | POA: Diagnosis not present

## 2019-05-26 DIAGNOSIS — M5136 Other intervertebral disc degeneration, lumbar region: Secondary | ICD-10-CM | POA: Diagnosis not present

## 2019-05-26 DIAGNOSIS — G894 Chronic pain syndrome: Secondary | ICD-10-CM | POA: Diagnosis not present

## 2019-06-06 ENCOUNTER — Inpatient Hospital Stay (HOSPITAL_COMMUNITY)
Admission: EM | Admit: 2019-06-06 | Discharge: 2019-06-08 | DRG: 247 | Disposition: A | Discharge status: Home / Self Care | Payer: Medicare Other | Attending: Cardiovascular Disease | Admitting: Cardiovascular Disease

## 2019-06-06 ENCOUNTER — Emergency Department (HOSPITAL_COMMUNITY): Payer: Medicare Other

## 2019-06-06 ENCOUNTER — Other Ambulatory Visit: Payer: Self-pay

## 2019-06-06 ENCOUNTER — Encounter (HOSPITAL_COMMUNITY): Payer: Self-pay | Admitting: *Deleted

## 2019-06-06 DIAGNOSIS — I493 Ventricular premature depolarization: Secondary | ICD-10-CM | POA: Diagnosis present

## 2019-06-06 DIAGNOSIS — R000 Tachycardia, unspecified: Secondary | ICD-10-CM | POA: Diagnosis not present

## 2019-06-06 DIAGNOSIS — Z833 Family history of diabetes mellitus: Secondary | ICD-10-CM

## 2019-06-06 DIAGNOSIS — Z7984 Long term (current) use of oral hypoglycemic drugs: Secondary | ICD-10-CM

## 2019-06-06 DIAGNOSIS — G8929 Other chronic pain: Secondary | ICD-10-CM | POA: Diagnosis present

## 2019-06-06 DIAGNOSIS — I214 Non-ST elevation (NSTEMI) myocardial infarction: Secondary | ICD-10-CM | POA: Diagnosis not present

## 2019-06-06 DIAGNOSIS — E118 Type 2 diabetes mellitus with unspecified complications: Secondary | ICD-10-CM | POA: Diagnosis present

## 2019-06-06 DIAGNOSIS — I213 ST elevation (STEMI) myocardial infarction of unspecified site: Secondary | ICD-10-CM | POA: Diagnosis present

## 2019-06-06 DIAGNOSIS — Z955 Presence of coronary angioplasty implant and graft: Secondary | ICD-10-CM

## 2019-06-06 DIAGNOSIS — I10 Essential (primary) hypertension: Secondary | ICD-10-CM | POA: Diagnosis not present

## 2019-06-06 DIAGNOSIS — I2119 ST elevation (STEMI) myocardial infarction involving other coronary artery of inferior wall: Principal | ICD-10-CM | POA: Diagnosis present

## 2019-06-06 DIAGNOSIS — R0689 Other abnormalities of breathing: Secondary | ICD-10-CM | POA: Diagnosis not present

## 2019-06-06 DIAGNOSIS — F17210 Nicotine dependence, cigarettes, uncomplicated: Secondary | ICD-10-CM | POA: Diagnosis present

## 2019-06-06 DIAGNOSIS — Z79891 Long term (current) use of opiate analgesic: Secondary | ICD-10-CM

## 2019-06-06 DIAGNOSIS — Z1159 Encounter for screening for other viral diseases: Secondary | ICD-10-CM | POA: Diagnosis not present

## 2019-06-06 DIAGNOSIS — I499 Cardiac arrhythmia, unspecified: Secondary | ICD-10-CM | POA: Diagnosis not present

## 2019-06-06 DIAGNOSIS — Z9114 Patient's other noncompliance with medication regimen: Secondary | ICD-10-CM

## 2019-06-06 DIAGNOSIS — I252 Old myocardial infarction: Secondary | ICD-10-CM

## 2019-06-06 DIAGNOSIS — F17200 Nicotine dependence, unspecified, uncomplicated: Secondary | ICD-10-CM | POA: Diagnosis present

## 2019-06-06 DIAGNOSIS — Z7982 Long term (current) use of aspirin: Secondary | ICD-10-CM

## 2019-06-06 DIAGNOSIS — Z9049 Acquired absence of other specified parts of digestive tract: Secondary | ICD-10-CM

## 2019-06-06 DIAGNOSIS — Z791 Long term (current) use of non-steroidal anti-inflammatories (NSAID): Secondary | ICD-10-CM

## 2019-06-06 DIAGNOSIS — I200 Unstable angina: Secondary | ICD-10-CM | POA: Diagnosis present

## 2019-06-06 DIAGNOSIS — I25110 Atherosclerotic heart disease of native coronary artery with unstable angina pectoris: Secondary | ICD-10-CM | POA: Diagnosis not present

## 2019-06-06 DIAGNOSIS — K219 Gastro-esophageal reflux disease without esophagitis: Secondary | ICD-10-CM | POA: Diagnosis present

## 2019-06-06 DIAGNOSIS — F329 Major depressive disorder, single episode, unspecified: Secondary | ICD-10-CM | POA: Diagnosis present

## 2019-06-06 DIAGNOSIS — R079 Chest pain, unspecified: Secondary | ICD-10-CM | POA: Diagnosis not present

## 2019-06-06 DIAGNOSIS — Z716 Tobacco abuse counseling: Secondary | ICD-10-CM

## 2019-06-06 DIAGNOSIS — Z888 Allergy status to other drugs, medicaments and biological substances status: Secondary | ICD-10-CM

## 2019-06-06 DIAGNOSIS — Z880 Allergy status to penicillin: Secondary | ICD-10-CM

## 2019-06-06 DIAGNOSIS — Z20828 Contact with and (suspected) exposure to other viral communicable diseases: Secondary | ICD-10-CM | POA: Diagnosis not present

## 2019-06-06 DIAGNOSIS — E782 Mixed hyperlipidemia: Secondary | ICD-10-CM | POA: Diagnosis present

## 2019-06-06 DIAGNOSIS — E785 Hyperlipidemia, unspecified: Secondary | ICD-10-CM | POA: Diagnosis present

## 2019-06-06 DIAGNOSIS — Z79899 Other long term (current) drug therapy: Secondary | ICD-10-CM

## 2019-06-06 DIAGNOSIS — I472 Ventricular tachycardia: Secondary | ICD-10-CM | POA: Diagnosis not present

## 2019-06-06 DIAGNOSIS — Z8249 Family history of ischemic heart disease and other diseases of the circulatory system: Secondary | ICD-10-CM

## 2019-06-06 DIAGNOSIS — I2111 ST elevation (STEMI) myocardial infarction involving right coronary artery: Secondary | ICD-10-CM

## 2019-06-06 DIAGNOSIS — M545 Low back pain: Secondary | ICD-10-CM | POA: Diagnosis present

## 2019-06-06 DIAGNOSIS — I2510 Atherosclerotic heart disease of native coronary artery without angina pectoris: Secondary | ICD-10-CM | POA: Diagnosis present

## 2019-06-06 DIAGNOSIS — M797 Fibromyalgia: Secondary | ICD-10-CM | POA: Diagnosis present

## 2019-06-06 HISTORY — DX: ST elevation (STEMI) myocardial infarction of unspecified site: I21.3

## 2019-06-06 LAB — BASIC METABOLIC PANEL
Anion gap: 10 (ref 5–15)
BUN: 12 mg/dL (ref 6–20)
CO2: 25 mmol/L (ref 22–32)
Calcium: 9.2 mg/dL (ref 8.9–10.3)
Chloride: 105 mmol/L (ref 98–111)
Creatinine, Ser: 0.75 mg/dL (ref 0.61–1.24)
GFR calc Af Amer: >60 mL/min (ref >60)
GFR calc non Af Amer: >60 mL/min (ref >60)
Glucose, Bld: 159 mg/dL — ABNORMAL HIGH (ref 70–99)
Potassium: 3.6 mmol/L (ref 3.5–5.1)
Sodium: 140 mmol/L (ref 135–145)

## 2019-06-06 LAB — CBC
HCT: 47 % (ref 39.0–52.0)
Hemoglobin: 16.2 g/dL (ref 13.0–17.0)
MCH: 31.6 pg (ref 26.0–34.0)
MCHC: 34.5 g/dL (ref 30.0–36.0)
MCV: 91.6 fL (ref 80.0–100.0)
Platelets: 131 10*3/uL — ABNORMAL LOW (ref 150–400)
RBC: 5.13 MIL/uL (ref 4.22–5.81)
RDW: 12.4 % (ref 11.5–15.5)
WBC: 8.8 10*3/uL (ref 4.0–10.5)
nRBC: 0 % (ref 0.0–0.2)

## 2019-06-06 LAB — TROPONIN I (HIGH SENSITIVITY)
Troponin I (High Sensitivity): 50 ng/L — ABNORMAL HIGH (ref <18)
Troponin I (High Sensitivity): 82 ng/L — ABNORMAL HIGH (ref <18)

## 2019-06-06 LAB — SARS CORONAVIRUS 2 BY RT PCR (HOSPITAL ORDER, PERFORMED IN ~~LOC~~ HOSPITAL LAB): SARS Coronavirus 2: NEGATIVE

## 2019-06-06 MED ORDER — ONDANSETRON HCL 4 MG/2ML IJ SOLN
INTRAMUSCULAR | Status: AC
  Administered 2019-06-06: 4 mg
  Filled 2019-06-06: qty 2

## 2019-06-06 MED ORDER — MORPHINE SULFATE (PF) 4 MG/ML IV SOLN
4.0000 mg | Freq: Once | INTRAVENOUS | Status: AC
  Administered 2019-06-06: 4 mg via INTRAVENOUS
  Filled 2019-06-06: qty 1

## 2019-06-06 MED ORDER — NITROGLYCERIN IN D5W 200-5 MCG/ML-% IV SOLN
5.0000 ug/min | INTRAVENOUS | Status: DC
  Administered 2019-06-06: 5 ug/min via INTRAVENOUS
  Filled 2019-06-06: qty 250

## 2019-06-06 MED ORDER — ASPIRIN 81 MG PO CHEW
324.0000 mg | CHEWABLE_TABLET | Freq: Once | ORAL | Status: AC
  Administered 2019-06-06: 324 mg via ORAL
  Filled 2019-06-06: qty 4

## 2019-06-06 MED ORDER — HEPARIN BOLUS VIA INFUSION
4000.0000 [IU] | Freq: Once | INTRAVENOUS | Status: AC
  Administered 2019-06-06: 4000 [IU] via INTRAVENOUS

## 2019-06-06 MED ORDER — MORPHINE SULFATE (PF) 4 MG/ML IV SOLN
INTRAVENOUS | Status: AC
  Administered 2019-06-06: 4 mg
  Filled 2019-06-06: qty 1

## 2019-06-06 MED ORDER — ASPIRIN 81 MG PO CHEW
CHEWABLE_TABLET | ORAL | Status: AC
  Filled 2019-06-06: qty 4

## 2019-06-06 MED ORDER — ONDANSETRON HCL 4 MG/2ML IJ SOLN
INTRAMUSCULAR | Status: AC
  Administered 2019-06-06: 2 mg
  Filled 2019-06-06: qty 2

## 2019-06-06 MED ORDER — HEPARIN (PORCINE) 25000 UT/250ML-% IV SOLN
1000.0000 [IU]/h | INTRAVENOUS | Status: DC
  Administered 2019-06-06: 1000 [IU]/h via INTRAVENOUS
  Filled 2019-06-06: qty 250

## 2019-06-06 NOTE — H&P (Signed)
CARDIOLOGY H&P  HPI: Howard ShutterBryan L Agustin Sr. is a 58 y.o. male w/ history of HLD, HTN, smoking, DM2, and CAD (s/p MI and PCI in 2007) who presents with CP.  Patient notes he has had a headache all day today, which is not typically a problem for him. Around 4pm he then developed chest pain. Pain is in his center chest with radiation to the L side. Feels like a burning. Much worse than prior chest pain that he had with his MI. Came to [facility] and was found to be hypertensive with systolic blood pressures in the 200's. His ECG revealed subtle ST elevation in inferior leads and his hsTnI was elevated at 50. He was transferred to [facility] due to likely need for emergent angiography.   On arrival to [facility], the patient continued to report severe central chest pain and burning radiating to the left chest. He was placed on a nitroglycerin drip which was increased to bring his systolic pressure down to about 180mmHg. Despite this his chest pain and headache persisted. Blood pressures in both arms were similar. A repeat troponin was 84. He was given aspirin and heparin. A repeat ECG revealed 2mm ST elevation in leads II, III, and aVF. He was brought to the cath lab for emergent angiography.   Notably, the patient has a history of MI back in 2007 and had RCA stents placed at that time. He subsequently underwent diagnostic angiography in 2018 after presenting with crescendo angina, however he was not found to have obstructive epicardial coronary disease at that time.   Review of Systems:     Cardiac Review of Systems: {Y] = yes [ ]  = no  Chest Pain [  Y  ]  Resting SOB [ Y  ] Exertional SOB  [  ]  Orthopnea [  ]   Pedal Edema [   ]    Palpitations [  ] Syncope  [  ]   Presyncope [   ]  General Review of Systems: [Y] = yes [  ]=no Constitional: recent weight change [  ]; anorexia [  ]; fatigue [  ]; nausea [  ]; night sweats [  ]; fever [  ]; or chills [  ];                                                                      Dental: poor dentition[  ];   Eye : blurred vision [  ]; diplopia [   ]; vision changes [  ];  Amaurosis fugax[  ]; Resp: cough [  ];  wheezing[  ];  hemoptysis[  ]; shortness of breath[  ]; paroxysmal nocturnal dyspnea[  ]; dyspnea on exertion[  ]; or orthopnea[  ];  GI:  gallstones[  ], vomiting[  ];  dysphagia[  ]; melena[  ];  hematochezia [  ]; heartburn[  ];   GU: kidney stones [  ]; hematuria[  ];   dysuria [  ];  nocturia[  ];               Skin: rash [  ], swelling[  ];, hair loss[  ];  peripheral edema[  ];  or itching[  ];  Musculosketetal: myalgias[  ];  joint swelling[  ];  joint erythema[  ];  joint pain[  ];  back pain[  ];  Heme/Lymph: bruising[  ];  bleeding[  ];  anemia[  ];  Neuro: TIA[  ];  headaches[  ];  stroke[  ];  vertigo[  ];  seizures[  ];   paresthesias[  ];  difficulty walking[  ];  Psych:depression[  ]; anxiety[  ];  Endocrine: diabetes[  ];  thyroid dysfunction[  ];  Other:  Past Medical History:  Diagnosis Date  . Chronic low back pain   . Coronary atherosclerosis of native coronary artery 2007   a. s/p stenting to proximal and mid-RCA in 2007 b. patent stents by cath in 01/2017  . Depression   . Dyslipidemia   . Essential hypertension   . Fibromyalgia   . GERD (gastroesophageal reflux disease)   . Headache   . NSTEMI (non-ST elevated myocardial infarction) (HCC)   . Type 2 diabetes mellitus (HCC)     Prior to Admission medications   Medication Sig Start Date End Date Taking? Authorizing Provider  aspirin EC 81 MG EC tablet Take 1 tablet (81 mg total) by mouth daily. 01/13/19  Yes Vassie LollMadera, Carlos, MD  atorvastatin (LIPITOR) 40 MG tablet Take 40 mg by mouth daily.   Yes [provider]  DULoxetine (CYMBALTA) 60 MG capsule Take 60 mg by mouth at bedtime.    Yes [provider]  empagliflozin (JARDIANCE) 25 MG TABS tablet Take 25 mg by mouth daily.   Yes [provider]  glipiZIDE (GLUCOTROL XL)  10 MG 24 hr tablet Take 10 mg by mouth at bedtime.   Yes [provider]  ibuprofen (ADVIL) 800 MG tablet Take 800 mg by mouth 3 (three) times daily.   Yes [provider]  isosorbide mononitrate (IMDUR) 30 MG 24 hr tablet Take 1 tablet (30 mg total) by mouth daily. 01/28/19  Yes BranchDorothe Pea, Jonathan F, MD  lisinopril (PRINIVIL,ZESTRIL) 2.5 MG tablet Take 1 tablet (2.5 mg total) by mouth daily. 01/28/19  Yes BranchDorothe Pea, Jonathan F, MD  metoprolol succinate (TOPROL-XL) 25 MG 24 hr tablet Take 0.5 tablets (12.5 mg total) by mouth daily. 01/28/19  Yes BranchDorothe Pea, Jonathan F, MD  omeprazole (PRILOSEC) 20 MG capsule Take 20 mg by mouth daily.    Yes [provider]  oxyCODONE (ROXICODONE) 15 MG immediate release tablet Take 15 mg by mouth every 6 (six) hours.   Yes [provider]  nitroGLYCERIN (NITROSTAT) 0.4 MG SL tablet Place 0.4 mg under the tongue every 5 (five) minutes as needed for chest pain.     [provider]      Allergies  Allergen Reactions  . Ampicillin Other (See Comments)    REACTION: Headache, diarrhea  . Other     HMG-COA Reductase Inhibitors - elevated liver tests  . Penicillins Diarrhea    .Did it involve swelling of the face/tongue/throat, SOB, or low BP? No Did it involve sudden or severe rash/hives, skin peeling, or any reaction on the inside of your mouth or nose? No Did you need to seek medical attention at a hospital or doctor's office? No When did it last happen?Unknown If all above answers are "NO", may proceed with cephalosporin use.     Social History   Socioeconomic History  . Marital status: Married    Spouse name: Not on file  . Number of children: 3  . Years of education: 10  . Highest  education level: Not on file  Occupational History  . Not on file  Social Needs  . Financial resource strain: Not on file  . Food insecurity    Worry: Not on file    Inability: Not on file  . Transportation needs    Medical:  Not on file    Non-medical: Not on file  Tobacco Use  . Smoking status: Current Every Day Smoker    Packs/day: 1.00    Types: Cigarettes    Start date: 12/01/1972  . Smokeless tobacco: Never Used  . Tobacco comment: 1.5-2 packs per day  Substance and Sexual Activity  . Alcohol use: No    Alcohol/week: 0.0 standard drinks    Frequency: Never  . Drug use: No  . Sexual activity: Not on file  Lifestyle  . Physical activity    Days per week: Not on file    Minutes per session: Not on file  . Stress: Not on file  Relationships  . Social Musicianconnections    Talks on phone: Not on file    Gets together: Not on file    Attends religious service: Not on file    Active member of club or organization: Not on file    Attends meetings of clubs or organizations: Not on file    Relationship status: Not on file  . Intimate partner violence    Fear of current or ex partner: Not on file    Emotionally abused: Not on file    Physically abused: Not on file    Forced sexual activity: Not on file  Other Topics Concern  . Not on file  Social History Narrative   Lives at home with his wife & 2 sons   Right handed   No caffeine    Family History  Problem Relation Age of Onset  . Coronary artery disease Mother   . Coronary artery disease Father   . Diabetes Father     PHYSICAL EXAM: Vitals:   06/06/19 2333 06/06/19 2335  BP: (!) 191/112 (!) 183/105  Pulse: 86 81  Resp: 20 (!) 35  Temp:    SpO2: 100% 99%   General:  Anxious, uncomfortable HEENT: normal Neck: supple. no JVD.  Cor: PMI nondisplaced. Regular rate & rhythm. No rubs, gallops or murmurs. Lungs: crackles at the bilateral lung bases Abdomen: soft, nontender, nondistended. No hepatosplenomegaly. No bruits or masses. Good bowel sounds. Extremities: no cyanosis, clubbing, rash, edema Neuro: alert & oriented x 3, cranial nerves grossly intact. moves all 4 extremities w/o difficulty. Affect pleasant.  ECG: 2mm ST elevation in  leads II, III, aVF without Q waves  Results for orders placed or performed during the hospital encounter of 06/06/19 (from the past 24 hour(s))  Basic metabolic panel     Status: Abnormal   Collection Time: 06/06/19  8:21 PM  Result Value Ref Range   Sodium 140 135 - 145 mmol/L   Potassium 3.6 3.5 - 5.1 mmol/L   Chloride 105 98 - 111 mmol/L   CO2 25 22 - 32 mmol/L   Glucose, Bld 159 (H) 70 - 99 mg/dL   BUN 12 6 - 20 mg/dL   Creatinine, Ser 9.600.75 0.61 - 1.24 mg/dL   Calcium 9.2 8.9 - 45.410.3 mg/dL   GFR calc non Af Amer >60 >60 mL/min   GFR calc Af Amer >60 >60 mL/min   Anion gap 10 5 - 15  CBC     Status: Abnormal   Collection Time: 06/06/19  8:21 PM  Result Value Ref Range   WBC 8.8 4.0 - 10.5 K/uL   RBC 5.13 4.22 - 5.81 MIL/uL   Hemoglobin 16.2 13.0 - 17.0 g/dL   HCT 47.0 39.0 - 52.0 %   MCV 91.6 80.0 - 100.0 fL   MCH 31.6 26.0 - 34.0 pg   MCHC 34.5 30.0 - 36.0 g/dL   RDW 12.4 11.5 - 15.5 %   Platelets 131 (L) 150 - 400 K/uL   nRBC 0.0 0.0 - 0.2 %  Troponin I (High Sensitivity)     Status: Abnormal   Collection Time: 06/06/19  8:21 PM  Result Value Ref Range   Troponin I (High Sensitivity) 50.00 (H) <18 ng/L  Troponin I (High Sensitivity)     Status: Abnormal   Collection Time: 06/06/19 10:15 PM  Result Value Ref Range   Troponin I (High Sensitivity) 82.00 (H) <18 ng/L   Dg Chest Portable 1 View  Result Date: 06/06/2019 CLINICAL DATA:  58 y/o M; intermittent left-sided chest pain radiating to the left arm. EXAM: PORTABLE CHEST 1 VIEW COMPARISON:  01/12/2019 chest radiograph FINDINGS: Stable heart size and mediastinal contours are within normal limits. Both lungs are clear. The visualized skeletal structures are unremarkable. IMPRESSION: No active disease. Electronically Signed   By: Kristine Garbe M.D.   On: 06/06/2019 20:52   ASSESSMENT: Fredy Gladu. is a 58 y.o. male w/ history of HLD, HTN, smoking, DM2, and CAD (s/p MI and PCI in 2007) who presents with CP  and STEMI.  Patient found to have occluded PL branch of RCA, now s/p PCI.   PLAN/DISCUSSION: #) STEMI Diagnostics: - echo in AM - check lipids, A1c - measured LDL if unable to calculate due to TG's Therapeutics: - ASA 81mg  daily - Ticagrelor 180mg  x 1 then 90mg  BID - Bivalirudin gtt to finish in 2H - increase atorvastatin to 80mg  QHS - increase home lisinopril to 5mg  daily - d/c home metoprolol, start carvedilol 3.125mg  BID - SLN, nitro gtt PRN - cardiac rehab - smoking cessation counseling  #) DM - continue empagliflozin  Marcie Mowers, MD Cardiology Fellow, PGY-6

## 2019-06-06 NOTE — Progress Notes (Signed)
ANTICOAGULATION CONSULT NOTE - Initial Consult  Pharmacy Consult for heparin gtt  Indication: chest pain/ACS  Allergies  Allergen Reactions  . Ampicillin Other (See Comments)    REACTION: Headache, diarrhea  . Other     HMG-COA Reductase Inhibitors - elevated liver tests  . Penicillins Diarrhea    .Did it involve swelling of the face/tongue/throat, SOB, or low BP? No Did it involve sudden or severe rash/hives, skin peeling, or any reaction on the inside of your mouth or nose? No Did you need to seek medical attention at a hospital or doctor's office? No When did it last happen?Unknown If all above answers are "NO", may proceed with cephalosporin use.     Patient Measurements: Height: 5\' 10"  (177.8 cm) Weight: 180 lb (81.6 kg) IBW/kg (Calculated) : 73 Heparin Dosing Weight: HEPARIN DW (KG): 81.6   Vital Signs: Temp: 98.1 F (36.7 C) (06/29 2011) Temp Source: Oral (06/29 2011) BP: 175/91 (06/29 2011) Pulse Rate: 82 (06/29 2011)  Labs: Recent Labs    06/06/19 2021  HGB 16.2  HCT 47.0  PLT 131*  CREATININE 0.75    Estimated Creatinine Clearance: 102.7 mL/min (by C-G formula based on SCr of 0.75 mg/dL).   Medical History: Past Medical History:  Diagnosis Date  . Chronic low back pain   . Coronary atherosclerosis of native coronary artery 2007   a. s/p stenting to proximal and mid-RCA in 2007 b. patent stents by cath in 01/2017  . Depression   . Dyslipidemia   . Essential hypertension   . Fibromyalgia   . GERD (gastroesophageal reflux disease)   . Headache   . NSTEMI (non-ST elevated myocardial infarction) ([place])   . Type 2 diabetes mellitus (HCC)     Medications:  (Not in a hospital admission)  Scheduled:  . heparin  4,000 Units Intravenous Once   Infusions:  . heparin    . nitroGLYCERIN     PRN:  Anti-infectives (From admission, onward)   None      Assessment: Howard Fleet Sr. a 58 y.o. male requires anticoagulation with a heparin iv  infusion for the indication of  chest pain/ACS. Heparin gtt will be started following pharmacy protocol per pharmacy consult. Patient is not on previous oral anticoagulant that will require aPTT/HL correlation before transitioning to only HL monitoring.   Goal of Therapy:  Heparin level 0.3-0.7 units/ml Monitor platelets by anticoagulation protocol: Yes   Plan:  Give 4000 units bolus x 1 Start heparin infusion at 1000 units/hr Check anti-Xa level in 6 hours and daily while on heparin Continue to monitor H&H and platelets  Heparin level to be drawn in 6 hours. Dymin Dingledine 06/06/2019,10:27 PM

## 2019-06-06 NOTE — ED Triage Notes (Signed)
Pt arrives to ED via [facility] after with c/o chest pain and SOB.

## 2019-06-06 NOTE — ED Provider Notes (Signed)
[facility] EMERGENCY DEPARTMENT Provider Note   CSN: [id number] Arrival date & time: 06/06/19  2002    History   Chief Complaint Chief Complaint  Patient presents with  . Chest Pain    HPI Howard NettlesBryan L Squillace Sr. is a 58 y.o. male.     HPI   I was called to the patient's room to see him because of nurses concern for an acute cardiac process, at 10:10 PM.  At this time he is sitting on the bed, uncomfortable, complaining of pain in his chest and left arm.  He is pale and diaphoretic.  He states the pain is been present on and off since 4 PM today.  He is currently having severe pain.  He states he has previously had similar pain like this when he had a cardiac stent.  Usually takes aspirin, but did not today.  He denies fever, chills, cough or shortness of breath, weakness or dizziness.  There are no other known modifying factors.  Past Medical History:  Diagnosis Date  . Chronic low back pain   . Coronary atherosclerosis of native coronary artery 2007   a. s/p stenting to proximal and mid-RCA in 2007 b. patent stents by cath in 01/2017  . Depression   . Dyslipidemia   . Essential hypertension   . Fibromyalgia   . GERD (gastroesophageal reflux disease)   . Headache   . NSTEMI (non-ST elevated myocardial infarction) (HCC)   . Type 2 diabetes mellitus [facility](HCC)     Patient Active Problem List   Diagnosis Date Noted  . Chest pain 01/12/2019  . GERD (gastroesophageal reflux disease) 01/12/2019  . Thrombocytopenia (HCC) 01/12/2019  . Hypokalemia 01/12/2019  . Hypocalcemia 01/12/2019  . Type 2 diabetes mellitus with complication (HCC) 01/31/2017  . Accelerating angina (HCC) 01/30/2017  . TOBACCO ABUSE 12/24/2009  . EPIGASTRIC PAIN 12/24/2009  . Hyperlipidemia 06/15/2009  . Depression 06/15/2009  . Essential hypertension 06/15/2009  . CORONARY ATHEROSCLEROSIS NATIVE CORONARY ARTERY 06/15/2009  . FIBROMYALGIA 06/15/2009  . CHEST PAIN 06/15/2009    Past Surgical History:   Procedure Laterality Date  . APPENDECTOMY    . CARDIAC CATHETERIZATION     [phone]  . CHOLECYSTECTOMY  2009  . CORONARY STENT PLACEMENT    . KNEE SURGERY Right   . LEFT HEART CATH AND CORONARY ANGIOGRAPHY N/A 01/30/2017   Procedure: Left Heart Cath and Coronary Angiography;  Surgeon: Yvonne Kendallhristopher End, MD;  [facility];  Service: Cardiovascular;  Laterality: N/A;  . NASAL SINUS SURGERY    . SHOULDER SURGERY     [phone]  . SHOULDER SURGERY     R & L        Home Medications    Prior to Admission medications   Medication Sig Start Date End Date Taking? Authorizing Provider  aspirin EC 81 MG EC tablet Take 1 tablet (81 mg total) by mouth daily. 01/13/19  Yes Vassie LollMadera, Carlos, MD  DULoxetine (CYMBALTA) 60 MG capsule Take 60 mg by mouth at bedtime.    Yes [provider]  glipiZIDE (GLUCOTROL XL) 10 MG 24 hr tablet Take 10 mg by mouth at bedtime.   Yes [provider]  isosorbide mononitrate (IMDUR) 30 MG 24 hr tablet Take 1 tablet (30 mg total) by mouth daily. 01/28/19  Yes BranchDorothe Pea, Jonathan F, MD  lisinopril (PRINIVIL,ZESTRIL) 2.5 MG tablet Take 1 tablet (2.5 mg total) by mouth daily. 01/28/19  Yes Antoine PocheBranch, Jonathan F, MD  omeprazole (PRILOSEC) 20 MG capsule  Take 20 mg by mouth 2 (two) times daily as needed (acid reflux).    Yes [provider]  amitriptyline (ELAVIL) 25 MG tablet Take 2 tablets (50 mg total) by mouth at bedtime. 06/25/18   Anson FretAhern, Antonia B, MD  Calcium-Magnesium-Zinc (CAL-MAG-ZINC PO) Take 2 tablets by mouth at bedtime.     [provider]  fluticasone (FLONASE) 50 MCG/ACT nasal spray Place 2-3 sprays into both nostrils daily as needed (congestion).  10/20/15   [provider]  gabapentin (NEURONTIN) 400 MG capsule Take 400 mg by mouth 3 (three) times daily.    [provider]  metoprolol succinate (TOPROL-XL) 25 MG 24 hr tablet Take 0.5 tablets (12.5 mg total) by mouth daily. 01/28/19   Antoine PocheBranch, Jonathan F,  MD  nitroGLYCERIN (NITROSTAT) 0.4 MG SL tablet Place 0.4 mg under the tongue every 5 (five) minutes as needed for chest pain.     [provider]  pravastatin (PRAVACHOL) 40 MG tablet Take 1 tablet (40 mg total) by mouth daily. 01/28/19   Antoine PocheBranch, Jonathan F, MD    Family History Family History  Problem Relation Age of Onset  . Coronary artery disease Mother   . Coronary artery disease Father   . Diabetes Father     Social History Social History   Tobacco Use  . Smoking status: Current Every Day Smoker    Packs/day: 1.00    Types: Cigarettes    Start date: 12/01/1972  . Smokeless tobacco: Never Used  . Tobacco comment: 1.5-2 packs per day  Substance Use Topics  . Alcohol use: No    Alcohol/week: 0.0 standard drinks    Frequency: Never  . Drug use: No     Allergies   Ampicillin, Other, and Penicillins   Review of Systems Review of Systems  All other systems reviewed and are negative.    Physical Exam Updated Vital Signs BP (!) 197/111   Pulse 77   Temp 98.1 F (36.7 C) (Oral)   Resp (!) 30   Ht 5\' 10"  (1.778 m)   Wt 81.6 kg   SpO2 100%   BMI 25.83 kg/m   Physical Exam   ED Treatments / Results  Labs (all labs ordered are listed, but only abnormal results are displayed) Labs Reviewed  BASIC METABOLIC PANEL - Abnormal; Notable for the following components:      Result Value   Glucose, Bld 159 (*)    All other components within normal limits  CBC - Abnormal; Notable for the following components:   Platelets 131 (*)    All other components within normal limits  TROPONIN I (HIGH SENSITIVITY) - Abnormal; Notable for the following components:   Troponin I (High Sensitivity) 50.00 (*)    All other components within normal limits  TROPONIN I (HIGH SENSITIVITY) - Abnormal; Notable for the following components:   Troponin I (High Sensitivity) 82.00 (*)    All other components within normal limits  SARS CORONAVIRUS 2 (HOSPITAL ORDER, PERFORMED IN  [facility])  CBC  HEPARIN LEVEL (UNFRACTIONATED)    EKG EKG Interpretation  Date/Time:  Monday June 06 2019 20:11:18 EDT Ventricular Rate:  79 PR Interval:  162 QRS Duration: 78 QT Interval:  376 QTC Calculation: 431 R Axis:   48 Text Interpretation:  Sinus rhythm with Premature atrial complexes Baseline wander Nonspecific ST abnormality Abnormal ECG since last tracing no significant change Confirmed by Mancel BaleWentz, Miliyah Luper [phone]([id number]) on 06/06/2019 8:58:20 PM   Radiology Dg Chest Portable 1  View  Result Date: 06/06/2019 CLINICAL DATA:  58 y/o M; intermittent left-sided chest pain radiating to the left arm. EXAM: PORTABLE CHEST 1 VIEW COMPARISON:  01/12/2019 chest radiograph FINDINGS: Stable heart size and mediastinal contours are within normal limits. Both lungs are clear. The visualized skeletal structures are unremarkable. IMPRESSION: No active disease. Electronically Signed   By: Mitzi HansenLance  Furusawa-Stratton M.D.   On: 06/06/2019 20:52    Procedures .Critical Care Performed by: Mancel BaleWentz, Jerian Morais, MD Authorized by: Mancel BaleWentz, Kikuye Korenek, MD   Critical care provider statement:    Critical care time (minutes):  35   Critical care start time:  06/06/2019 10:10 PM   Critical care end time:  06/06/2019 11:10 PM   Critical care time was exclusive of:  Separately billable procedures and treating other patients   Critical care was necessary to treat or prevent imminent or life-threatening deterioration of the following conditions:  Cardiac failure   Critical care was time spent personally by me on the following activities:  Blood draw for specimens, development of treatment plan with patient or surrogate, discussions with consultants, evaluation of patient's response to treatment, examination of patient, obtaining history from patient or surrogate, ordering and performing treatments and interventions, ordering and review of laboratory studies, pulse oximetry, re-evaluation of patient's condition,  review of old charts and ordering and review of radiographic studies   (including critical care time)  Medications Ordered in ED Medications  nitroGLYCERIN 50 mg in dextrose 5 % 250 mL (0.2 mg/mL) infusion (10 mcg/min Intravenous Rate/Dose Change 06/06/19 2235)  heparin ADULT infusion 100 units/mL (25000 units/21750mL sodium chloride 0.45%) (1,000 Units/hr Intravenous New Bag/Given 06/06/19 2233)  aspirin chewable tablet 324 mg (324 mg Oral Given 06/06/19 2223)  morphine 4 MG/ML injection 4 mg (4 mg Intravenous Given 06/06/19 2222)  heparin bolus via infusion 4,000 Units (4,000 Units Intravenous Bolus from Bag 06/06/19 2235)  ondansetron (ZOFRAN) 4 MG/2ML injection (2 mg  Given 06/06/19 2244)     Initial Impression / Assessment and Plan / ED Course  I have reviewed the triage vital signs and the nursing notes.  Pertinent labs & imaging results that were available during my care of the patient were reviewed by me and considered in my medical decision making (see chart for details).  Clinical Course as of Jun 05 2326  Mon Jun 06, 2019  2224 I asked the secretary to page Code STEMI   [EW]  2240 At this point I have discussed the case with Dr. Nanetta BattyJonathan Berry,, intervention[place] [facility].  We have conjointly discussed the EKG while looking at it.  The patient has inferior ST elevation which is "subtle," according to Dr. Allyson SabalBerry.  At this time, following initial treatment, the patient's pain has improved from 7/10 to 4/10.  He looks less pale and diaphoretic, at this time.  Pressure is improved somewhat.  EMS is currently here, loading him onto the stretcher, for transport.  He has received aspirin, which he vomited first then received Zofran and a second dose.  He is received 4000 units of heparin IV and is on a nitro drip at 10 mics per minute.  Patient is currently stable for transfer to a higher level of care.  Dr. Allyson SabalBerry wants him transferred to the  emergency department, where he will see the cardiac fellow, for reassessment and disposition planning.   [EW]  2245 I have discussed the case with Dr. Erasmo ScoreMike Butler, ED attending [facility].  He  understands that the patient is being transferred there to see the cardiac fellow, and receive further assessment and treatment.   [EW]  2316 Abnormal, high  Troponin I (High Sensitivity)(!) [EW]  2316 Normal except glucose elevated  Basic metabolic panel(!) [EW]  4098 Normal  CBC(!) [EW]  2316 DG Chest Portable 1 View [EW]  2316 No infiltrate of CHF  DG Chest Portable 1 View [EW]  2325 Abnormal, rising troponin  Troponin I (High Sensitivity)(!) [EW]    Clinical Course User Index [EW] Daleen Bo, MD        Patient Vitals for the past 24 hrs:  BP Temp Temp src Pulse Resp SpO2 Height Weight  06/06/19 2230 (!) 197/111 - - 77 (!) 30 100 % - -  06/06/19 2224 - - - - - - 5\' 10"  (1.778 m) 81.6 kg  06/06/19 2200 (!) 202/105 - - 72 (!) 24 99 % - -  06/06/19 2145 (!) 168/89 - - 76 14 94 % - -  06/06/19 2130 (!) 184/93 - - 76 17 97 % - -  06/06/19 2115 (!) 172/106 - - 80 16 95 % - -  06/06/19 2100 (!) 168/99 - - 82 15 95 % - -  06/06/19 2045 (!) 118/101 - - 74 15 97 % - -  06/06/19 2011 (!) 175/91 98.1 F (36.7 C) Oral 82 20 97 % - -    At time of transfer- reevaluation with update and discussion. After initial assessment and treatment, an updated evaluation reveals his pain is improved to 4/10.  Findings discussed with patient as well as his wife, on the phone.  All questions answered. Daleen Bo   Medical Decision Making: Chest pain with history of coronary artery disease and coronary stenting.  Initial troponin elevated.  EKG shows subtle ST elevations in leads II and III, not aVF.  No reciprocal changes.  His baseline wander making interpretation somewhat difficult.  Patient clinically appears to be having an MI.  4 he was treated as code STEMI and started on aspirin, nitroglycerin  and heparin.  He was also given morphine for pain control.  He was improved at time of transfer.  He is sent urgently by EMS to: Hospital to see cardiology services.  They plan to evaluate him in the emergency department prior to consideration for cardiac catheterization this evening.  Doubt hypertensive urgency, metabolic instability or impending vascular collapse.  CRITICAL CARE-yes Performed by: Daleen Bo   Nursing Notes Reviewed/ Care Coordinated Applicable Imaging Reviewed Interpretation of Laboratory Data incorporated into ED treatment  Plan-transfer to [facility] to see cardiology.  Disposition and treatment, per cardiology.    Final Clinical Impressions(s) / ED Diagnoses   Final diagnoses:  ST elevation myocardial infarction (STEMI), unspecified artery [facility])    ED Discharge Orders    None       Daleen Bo, MD 06/06/19 2329

## 2019-06-06 NOTE — ED Triage Notes (Signed)
Pt c/o left side intermittent sharp chest pain that radiates to left arm that started this afternoon, denies any sob, n/v.

## 2019-06-06 NOTE — ED Notes (Signed)
Spoke with patient's wife and provided update. 

## 2019-06-07 ENCOUNTER — Inpatient Hospital Stay (HOSPITAL_COMMUNITY): Payer: Medicare Other

## 2019-06-07 ENCOUNTER — Inpatient Hospital Stay (HOSPITAL_COMMUNITY)
Admission: EM | Disposition: A | Discharge status: Home / Self Care | Payer: Self-pay | Source: Home / Self Care | Attending: Cardiovascular Disease

## 2019-06-07 ENCOUNTER — Encounter (HOSPITAL_COMMUNITY): Payer: Self-pay | Admitting: Cardiovascular Disease

## 2019-06-07 DIAGNOSIS — F329 Major depressive disorder, single episode, unspecified: Secondary | ICD-10-CM | POA: Diagnosis present

## 2019-06-07 DIAGNOSIS — Z791 Long term (current) use of non-steroidal anti-inflammatories (NSAID): Secondary | ICD-10-CM | POA: Diagnosis not present

## 2019-06-07 DIAGNOSIS — E7800 Pure hypercholesterolemia, unspecified: Secondary | ICD-10-CM

## 2019-06-07 DIAGNOSIS — Z833 Family history of diabetes mellitus: Secondary | ICD-10-CM | POA: Diagnosis not present

## 2019-06-07 DIAGNOSIS — I213 ST elevation (STEMI) myocardial infarction of unspecified site: Secondary | ICD-10-CM

## 2019-06-07 DIAGNOSIS — I2111 ST elevation (STEMI) myocardial infarction involving right coronary artery: Secondary | ICD-10-CM | POA: Diagnosis not present

## 2019-06-07 DIAGNOSIS — Z1159 Encounter for screening for other viral diseases: Secondary | ICD-10-CM | POA: Diagnosis not present

## 2019-06-07 DIAGNOSIS — Z79891 Long term (current) use of opiate analgesic: Secondary | ICD-10-CM | POA: Diagnosis not present

## 2019-06-07 DIAGNOSIS — I2119 ST elevation (STEMI) myocardial infarction involving other coronary artery of inferior wall: Secondary | ICD-10-CM | POA: Diagnosis present

## 2019-06-07 DIAGNOSIS — Z888 Allergy status to other drugs, medicaments and biological substances status: Secondary | ICD-10-CM | POA: Diagnosis not present

## 2019-06-07 DIAGNOSIS — I10 Essential (primary) hypertension: Secondary | ICD-10-CM | POA: Diagnosis present

## 2019-06-07 DIAGNOSIS — Z955 Presence of coronary angioplasty implant and graft: Secondary | ICD-10-CM | POA: Diagnosis not present

## 2019-06-07 DIAGNOSIS — Z880 Allergy status to penicillin: Secondary | ICD-10-CM | POA: Diagnosis not present

## 2019-06-07 DIAGNOSIS — I472 Ventricular tachycardia: Secondary | ICD-10-CM | POA: Diagnosis present

## 2019-06-07 DIAGNOSIS — G8929 Other chronic pain: Secondary | ICD-10-CM | POA: Diagnosis present

## 2019-06-07 DIAGNOSIS — Z7982 Long term (current) use of aspirin: Secondary | ICD-10-CM | POA: Diagnosis not present

## 2019-06-07 DIAGNOSIS — E782 Mixed hyperlipidemia: Secondary | ICD-10-CM | POA: Diagnosis present

## 2019-06-07 DIAGNOSIS — I25110 Atherosclerotic heart disease of native coronary artery with unstable angina pectoris: Secondary | ICD-10-CM | POA: Diagnosis present

## 2019-06-07 DIAGNOSIS — I252 Old myocardial infarction: Secondary | ICD-10-CM | POA: Diagnosis not present

## 2019-06-07 DIAGNOSIS — Z79899 Other long term (current) drug therapy: Secondary | ICD-10-CM | POA: Diagnosis not present

## 2019-06-07 DIAGNOSIS — Z8249 Family history of ischemic heart disease and other diseases of the circulatory system: Secondary | ICD-10-CM | POA: Diagnosis not present

## 2019-06-07 DIAGNOSIS — E118 Type 2 diabetes mellitus with unspecified complications: Secondary | ICD-10-CM | POA: Diagnosis present

## 2019-06-07 DIAGNOSIS — M797 Fibromyalgia: Secondary | ICD-10-CM | POA: Diagnosis present

## 2019-06-07 DIAGNOSIS — Z7984 Long term (current) use of oral hypoglycemic drugs: Secondary | ICD-10-CM | POA: Diagnosis not present

## 2019-06-07 DIAGNOSIS — I2510 Atherosclerotic heart disease of native coronary artery without angina pectoris: Secondary | ICD-10-CM | POA: Diagnosis not present

## 2019-06-07 DIAGNOSIS — F17210 Nicotine dependence, cigarettes, uncomplicated: Secondary | ICD-10-CM | POA: Diagnosis present

## 2019-06-07 DIAGNOSIS — K219 Gastro-esophageal reflux disease without esophagitis: Secondary | ICD-10-CM | POA: Diagnosis present

## 2019-06-07 DIAGNOSIS — M545 Low back pain: Secondary | ICD-10-CM | POA: Diagnosis present

## 2019-06-07 HISTORY — PX: CORONARY/GRAFT ACUTE MI REVASCULARIZATION: CATH118305

## 2019-06-07 HISTORY — PX: LEFT HEART CATH AND CORONARY ANGIOGRAPHY: CATH118249

## 2019-06-07 LAB — CBC
HCT: 45.4 % (ref 39.0–52.0)
Hemoglobin: 15.6 g/dL (ref 13.0–17.0)
MCH: 31 pg (ref 26.0–34.0)
MCHC: 34.4 g/dL (ref 30.0–36.0)
MCV: 90.3 fL (ref 80.0–100.0)
Platelets: 134 10*3/uL — ABNORMAL LOW (ref 150–400)
RBC: 5.03 MIL/uL (ref 4.22–5.81)
RDW: 12 % (ref 11.5–15.5)
WBC: 9.5 10*3/uL (ref 4.0–10.5)
nRBC: 0 % (ref 0.0–0.2)

## 2019-06-07 LAB — GLUCOSE, CAPILLARY
Glucose-Capillary: 145 mg/dL — ABNORMAL HIGH (ref 70–99)
Glucose-Capillary: 280 mg/dL — ABNORMAL HIGH (ref 70–99)
Glucose-Capillary: 123 mg/dL — ABNORMAL HIGH (ref 70–99)
Glucose-Capillary: 257 mg/dL — ABNORMAL HIGH (ref 70–99)

## 2019-06-07 LAB — BASIC METABOLIC PANEL
Anion gap: 13 (ref 5–15)
BUN: 9 mg/dL (ref 6–20)
CO2: 22 mmol/L (ref 22–32)
Calcium: 9.1 mg/dL (ref 8.9–10.3)
Chloride: 105 mmol/L (ref 98–111)
Creatinine, Ser: 0.71 mg/dL (ref 0.61–1.24)
GFR calc Af Amer: >60 mL/min (ref >60)
GFR calc non Af Amer: >60 mL/min (ref >60)
Glucose, Bld: 165 mg/dL — ABNORMAL HIGH (ref 70–99)
Potassium: 3.6 mmol/L (ref 3.5–5.1)
Sodium: 140 mmol/L (ref 135–145)

## 2019-06-07 LAB — MRSA PCR SCREENING: MRSA by PCR: NEGATIVE

## 2019-06-07 LAB — POCT ACTIVATED CLOTTING TIME: Activated Clotting Time: 351 seconds

## 2019-06-07 LAB — HEMOGLOBIN A1C
Hgb A1c MFr Bld: 7.8 % — ABNORMAL HIGH (ref 4.8–5.6)
Mean Plasma Glucose: 177.16 mg/dL

## 2019-06-07 LAB — ECHOCARDIOGRAM COMPLETE
Height: 70.5 in
Weight: 2878.33 oz

## 2019-06-07 LAB — HIV ANTIBODY (ROUTINE TESTING W REFLEX): HIV Screen 4th Generation wRfx: NONREACTIVE

## 2019-06-07 SURGERY — CORONARY/GRAFT ACUTE MI REVASCULARIZATION
Anesthesia: LOCAL

## 2019-06-07 MED ORDER — TICAGRELOR 90 MG PO TABS
ORAL_TABLET | ORAL | Status: AC
  Filled 2019-06-07: qty 2

## 2019-06-07 MED ORDER — HYDROCODONE-ACETAMINOPHEN 5-325 MG PO TABS
1.0000 | ORAL_TABLET | Freq: Four times a day (QID) | ORAL | Status: DC | PRN
  Administered 2019-06-07 (×2): 2 via ORAL
  Filled 2019-06-07 (×2): qty 2

## 2019-06-07 MED ORDER — SODIUM CHLORIDE 0.9 % IV SOLN: 250.0000 mL | INTRAVENOUS | Status: DC | PRN

## 2019-06-07 MED ORDER — HEPARIN (PORCINE) IN NACL 1000-0.9 UT/500ML-% IV SOLN
INTRAVENOUS | Status: DC | PRN
  Administered 2019-06-07 (×2): 500 mL

## 2019-06-07 MED ORDER — LABETALOL HCL 5 MG/ML IV SOLN: 10.0000 mg | INTRAVENOUS | Status: AC | PRN

## 2019-06-07 MED ORDER — BIVALIRUDIN BOLUS VIA INFUSION - CUPID
INTRAVENOUS | Status: DC | PRN
  Administered 2019-06-07: 01:00:00 61.2 mg via INTRAVENOUS

## 2019-06-07 MED ORDER — GLIPIZIDE ER 10 MG PO TB24
10.0000 mg | ORAL_TABLET | Freq: Every day | ORAL | Status: DC
  Administered 2019-06-07 (×2): 10 mg via ORAL
  Filled 2019-06-07 (×3): qty 1

## 2019-06-07 MED ORDER — LIDOCAINE HCL (PF) 1 % IJ SOLN
INTRAMUSCULAR | Status: DC | PRN
  Administered 2019-06-07: 20 mL

## 2019-06-07 MED ORDER — VERAPAMIL HCL 2.5 MG/ML IV SOLN
INTRAVENOUS | Status: AC
  Filled 2019-06-07: qty 2

## 2019-06-07 MED ORDER — NITROGLYCERIN 1 MG/10 ML FOR IR/CATH LAB
INTRA_ARTERIAL | Status: AC
  Filled 2019-06-07: qty 10

## 2019-06-07 MED ORDER — TICAGRELOR 90 MG PO TABS
ORAL_TABLET | ORAL | Status: DC | PRN
  Administered 2019-06-07: 180 mg via ORAL

## 2019-06-07 MED ORDER — ATORVASTATIN CALCIUM 80 MG PO TABS
80.0000 mg | ORAL_TABLET | Freq: Every day | ORAL | Status: DC
  Administered 2019-06-07: 80 mg via ORAL
  Filled 2019-06-07: qty 1

## 2019-06-07 MED ORDER — BIVALIRUDIN TRIFLUOROACETATE 250 MG IV SOLR
INTRAVENOUS | Status: AC
  Filled 2019-06-07: qty 250

## 2019-06-07 MED ORDER — HEPARIN SODIUM (PORCINE) 5000 UNIT/ML IJ SOLN
5000.0000 [IU] | Freq: Three times a day (TID) | INTRAMUSCULAR | Status: DC
  Administered 2019-06-07 – 2019-06-08 (×3): 5000 [IU] via SUBCUTANEOUS
  Filled 2019-06-07 (×3): qty 1

## 2019-06-07 MED ORDER — METOPROLOL SUCCINATE 12.5 MG HALF TABLET
12.5000 mg | ORAL_TABLET | Freq: Every day | ORAL | Status: DC
  Filled 2019-06-07: qty 1

## 2019-06-07 MED ORDER — METOPROLOL SUCCINATE ER 25 MG PO TB24
25.0000 mg | ORAL_TABLET | Freq: Every day | ORAL | Status: DC
  Administered 2019-06-07 – 2019-06-08 (×2): 25 mg via ORAL
  Filled 2019-06-07 (×2): qty 1

## 2019-06-07 MED ORDER — HEPARIN (PORCINE) IN NACL 1000-0.9 UT/500ML-% IV SOLN
INTRAVENOUS | Status: AC
  Filled 2019-06-07: qty 1000

## 2019-06-07 MED ORDER — ONDANSETRON HCL 4 MG/2ML IJ SOLN
4.0000 mg | Freq: Four times a day (QID) | INTRAMUSCULAR | Status: DC | PRN
  Administered 2019-06-07: 4 mg via INTRAVENOUS
  Filled 2019-06-07: qty 2

## 2019-06-07 MED ORDER — FENTANYL CITRATE (PF) 100 MCG/2ML IJ SOLN
INTRAMUSCULAR | Status: AC
  Filled 2019-06-07: qty 2

## 2019-06-07 MED ORDER — LISINOPRIL 2.5 MG PO TABS
2.5000 mg | ORAL_TABLET | Freq: Every day | ORAL | Status: DC
  Administered 2019-06-07 – 2019-06-08 (×2): 2.5 mg via ORAL
  Filled 2019-06-07 (×2): qty 1

## 2019-06-07 MED ORDER — CHLORHEXIDINE GLUCONATE CLOTH 2 % EX PADS
6.0000 | MEDICATED_PAD | Freq: Every day | CUTANEOUS | Status: DC
  Administered 2019-06-07: 6 via TOPICAL

## 2019-06-07 MED ORDER — LIDOCAINE HCL (PF) 1 % IJ SOLN
INTRAMUSCULAR | Status: AC
  Filled 2019-06-07: qty 30

## 2019-06-07 MED ORDER — NITROGLYCERIN 0.4 MG SL SUBL: 0.4000 mg | SUBLINGUAL_TABLET | SUBLINGUAL | Status: DC | PRN

## 2019-06-07 MED ORDER — SODIUM CHLORIDE 0.9 % IV SOLN
INTRAVENOUS | Status: DC | PRN
  Administered 2019-06-07: 10 mL/h via INTRAVENOUS

## 2019-06-07 MED ORDER — HYDRALAZINE HCL 20 MG/ML IJ SOLN: 10.0000 mg | INTRAMUSCULAR | Status: AC | PRN

## 2019-06-07 MED ORDER — ASPIRIN 81 MG PO TBEC: 81.0000 mg | DELAYED_RELEASE_TABLET | Freq: Every day | ORAL | Status: DC

## 2019-06-07 MED ORDER — SODIUM CHLORIDE 0.9% FLUSH
3.0000 mL | Freq: Two times a day (BID) | INTRAVENOUS | Status: DC
  Administered 2019-06-07 – 2019-06-08 (×2): 3 mL via INTRAVENOUS

## 2019-06-07 MED ORDER — MORPHINE SULFATE (PF) 2 MG/ML IV SOLN
2.0000 mg | INTRAVENOUS | Status: DC | PRN
  Administered 2019-06-07 (×2): 2 mg via INTRAVENOUS
  Filled 2019-06-07 (×2): qty 1

## 2019-06-07 MED ORDER — SODIUM CHLORIDE 0.9% FLUSH: 3.0000 mL | INTRAVENOUS | Status: DC | PRN

## 2019-06-07 MED ORDER — ACETAMINOPHEN 325 MG PO TABS: 650.0000 mg | ORAL_TABLET | ORAL | Status: DC | PRN

## 2019-06-07 MED ORDER — SODIUM CHLORIDE 0.9 % IV SOLN
1.7500 mg/kg/h | INTRAVENOUS | Status: AC
  Administered 2019-06-07: 1.75 mg/kg/h via INTRAVENOUS
  Filled 2019-06-07: qty 250

## 2019-06-07 MED ORDER — SODIUM CHLORIDE 0.9 % IV SOLN
INTRAVENOUS | Status: AC | PRN
  Administered 2019-06-07: 10 mL/h via INTRAVENOUS

## 2019-06-07 MED ORDER — SODIUM CHLORIDE 0.9 % IV SOLN
INTRAVENOUS | Status: AC
  Administered 2019-06-07: 03:00:00 via INTRAVENOUS

## 2019-06-07 MED ORDER — FENTANYL CITRATE (PF) 100 MCG/2ML IJ SOLN
INTRAMUSCULAR | Status: DC | PRN
  Administered 2019-06-07: 25 ug via INTRAVENOUS

## 2019-06-07 MED ORDER — ACETAMINOPHEN 325 MG PO TABS
650.0000 mg | ORAL_TABLET | ORAL | Status: DC | PRN
  Administered 2019-06-08: 650 mg via ORAL
  Filled 2019-06-07: qty 2

## 2019-06-07 MED ORDER — IOHEXOL 350 MG/ML SOLN
INTRAVENOUS | Status: DC | PRN
  Administered 2019-06-07: 150 mL via INTRA_ARTERIAL

## 2019-06-07 MED ORDER — ONDANSETRON HCL 4 MG/2ML IJ SOLN: 4.0000 mg | Freq: Four times a day (QID) | INTRAMUSCULAR | Status: DC | PRN

## 2019-06-07 MED ORDER — PANTOPRAZOLE SODIUM 40 MG PO TBEC
40.0000 mg | DELAYED_RELEASE_TABLET | Freq: Every day | ORAL | Status: DC
  Administered 2019-06-07 – 2019-06-08 (×2): 40 mg via ORAL
  Filled 2019-06-07 (×2): qty 1

## 2019-06-07 MED ORDER — ASPIRIN EC 81 MG PO TBEC: 81.0000 mg | DELAYED_RELEASE_TABLET | Freq: Every day | ORAL | Status: DC

## 2019-06-07 MED ORDER — SODIUM CHLORIDE 0.9 % IV SOLN
INTRAVENOUS | Status: DC | PRN
  Administered 2019-06-07 (×2): 1.75 mg/kg/h via INTRAVENOUS

## 2019-06-07 MED ORDER — TICAGRELOR 90 MG PO TABS
90.0000 mg | ORAL_TABLET | Freq: Two times a day (BID) | ORAL | Status: DC
  Administered 2019-06-07 – 2019-06-08 (×3): 90 mg via ORAL
  Filled 2019-06-07 (×3): qty 1

## 2019-06-07 MED ORDER — DULOXETINE HCL 60 MG PO CPEP
60.0000 mg | ORAL_CAPSULE | Freq: Every day | ORAL | Status: DC
  Administered 2019-06-07 (×2): 60 mg via ORAL
  Filled 2019-06-07 (×2): qty 1

## 2019-06-07 MED ORDER — ASPIRIN 81 MG PO CHEW
81.0000 mg | CHEWABLE_TABLET | Freq: Every day | ORAL | Status: DC
  Administered 2019-06-07 – 2019-06-08 (×2): 81 mg via ORAL
  Filled 2019-06-07 (×2): qty 1

## 2019-06-07 SURGICAL SUPPLY — 21 items
BAG SNAP BAND KOVER 36X36 (MISCELLANEOUS) ×1 IMPLANT
BALLN SAPPHIRE 2.0X12 (BALLOONS) ×2
BALLN SAPPHIRE ~~LOC~~ 2.25X12 (BALLOONS) ×1 IMPLANT
BALLOON SAPPHIRE 2.0X12 (BALLOONS) IMPLANT
CATH INFINITI 5FR MULTPACK ANG (CATHETERS) ×1 IMPLANT
CATH VISTA GUIDE 6FR JR4 (CATHETERS) ×1 IMPLANT
COVER DOME SNAP 22 D (MISCELLANEOUS) ×1 IMPLANT
GLIDESHEATH SLEND A-KIT 6F 22G (SHEATH) ×1 IMPLANT
GUIDEWIRE INQWIRE 1.5J.035X260 (WIRE) IMPLANT
INQWIRE 1.5J .035X260CM (WIRE) ×2
KIT ENCORE 26 ADVANTAGE (KITS) ×1 IMPLANT
KIT HEART LEFT (KITS) ×2 IMPLANT
PACK CARDIAC CATHETERIZATION (CUSTOM PROCEDURE TRAY) ×2 IMPLANT
SHEATH PINNACLE 6F 10CM (SHEATH) ×1 IMPLANT
STENT RESOLUTE ONYX 2.0X18 (Permanent Stent) ×1 IMPLANT
SYR MEDRAD MARK 7 150ML (SYRINGE) ×2 IMPLANT
TRANSDUCER W/STOPCOCK (MISCELLANEOUS) ×2 IMPLANT
TUBING CIL FLEX 10 FLL-RA (TUBING) ×2 IMPLANT
WIRE ASAHI PROWATER 180CM (WIRE) ×1 IMPLANT
WIRE EMERALD 3MM-J .035X150CM (WIRE) ×1 IMPLANT
WIRE HI TORQ VERSACORE-J 145CM (WIRE) ×1 IMPLANT

## 2019-06-07 NOTE — Consult Note (Signed)
Responded to page, family said to be outside. Consulted with nurse, who'd just updated wife that pt was going up to Cath Lab. Prayed for pt. Staff will page if further chaplain services desired. Standing by.  Rev. Eloise Levels Chaplain

## 2019-06-07 NOTE — Progress Notes (Signed)
CARDIAC REHAB PHASE I   Began MI education with pt. Pt educated on importance of ASA, Brilinta, and NTG. Pt given MI booklet, heart healthy and diabetic diets. Pt states he is not interested in quitting smoking at this time, tip sheet left at bedside. Will return tomorrow when pt is more awake and able to walk to ambulate and finish education. Will continue to monitor.  [id number] Rufina Falco, RN BSN 06/07/2019 9:54 AM

## 2019-06-07 NOTE — Plan of Care (Signed)
  Problem: Education: Goal: Understanding of cardiac disease, CV risk reduction, and recovery process will improve Outcome: Progressing   Problem: Cardiac: Goal: Vascular access site(s) Level 0-1 will be maintained Outcome: Progressing

## 2019-06-07 NOTE — Progress Notes (Signed)
  Echocardiogram 2D Echocardiogram has been performed.  Danh Bayus L Androw 06/07/2019, 11:04 AM

## 2019-06-07 NOTE — ED Provider Notes (Signed)
11:30 PM  Transferred from [facility] ED with concerns for ACS.  Came in by emergency traffic with [facility].  Patient is a 59 y.o. M with h/o CAD s/p 2 stents who presents to ED with chest pain that is intermittent, tight with SOB, N/V, dizziness and diaphoresis since 4pm today.  Initial EKG had some inferior elevation.  Dr. Gwenlyn Found aware of patient.  Patient on NTG and heparin gtt.  Received ASA in route.  Here repeat EKG shows inferior STEMI.  Code STEMI activated by cardiology fellow, Dr. Emilio Aspen, at patient's bedside.  Appreciate cardiology at bedside.   Patient hypertensive.  Blood pressure equal in both arms.  He denies back pain, abdominal pain.  He has equal pulses in all 4 extremities.  We have increased his nitroglycerin drip and given him IV morphine for pain control.  Patient to go emergently to Cath Lab.     EKG Interpretation  Date/Time:  Monday June 06 2019 23:28:41 EDT Ventricular Rate:  79 PR Interval:  162 QRS Duration: 89 QT Interval:  388 QTC Calculation: 445 R Axis:   48 Text Interpretation:  Sinus rhythm Inferior infarct, acute (RCA) Probable RV involvement, suggest recording right precordial leads >>> Acute MI <<< Confirmed by Pryor Curia [phone]) on 06/07/2019 12:01:32 AM        CRITICAL CARE Performed by: Cyril Mourning Lavita Pontius   Total critical care time: 40 minutes  Critical care time was exclusive of separately billable procedures and treating other patients.  Critical care was necessary to treat or prevent imminent or life-threatening deterioration.  Critical care was time spent personally by me on the following activities: development of treatment plan with patient and/or surrogate as well as nursing, discussions with consultants, evaluation of patient's response to treatment, examination of patient, obtaining history from patient or surrogate, ordering and performing treatments and interventions, ordering and review of laboratory studies, ordering and review  of radiographic studies, pulse oximetry and re-evaluation of patient's condition.    Lace Chenevert, Delice Bison, DO 06/07/19 0022

## 2019-06-07 NOTE — Progress Notes (Signed)
Right femerol 23fr sheath pulled per cath lab at bedside. Pressure applied X 20 min. BP 132/56 R pedal pulse +2. Education provided and pt verbalized and demonstrated understanding.

## 2019-06-07 NOTE — Progress Notes (Signed)
Progress Note  Patient Name: Howard MESTRE Sr. Date of Encounter: 06/07/2019  Primary Cardiologist: Carlyle Dolly, MD   Subjective   No chest pain or dyspnea this am.   Inpatient Medications    Scheduled Meds: . aspirin  81 mg Oral Daily  . atorvastatin  80 mg Oral q1800  . Chlorhexidine Gluconate Cloth  6 each Topical Daily  . DULoxetine  60 mg Oral QHS  . glipiZIDE  10 mg Oral QHS  . heparin  5,000 Units Subcutaneous Q8H  . lisinopril  2.5 mg Oral Daily  . metoprolol succinate  12.5 mg Oral Daily  . pantoprazole  40 mg Oral Daily  . sodium chloride flush  3 mL Intravenous Q12H  . ticagrelor  90 mg Oral BID   Continuous Infusions: . sodium chloride 75 mL/hr at 06/07/19 0232  . sodium chloride     PRN Meds: sodium chloride, acetaminophen, morphine injection, nitroGLYCERIN, ondansetron (ZOFRAN) IV, sodium chloride flush   Vital Signs    Vitals:   06/07/19 0500 06/07/19 0600 06/07/19 0650 06/07/19 0700  BP: 130/79 133/85  137/88  Pulse: 82 82  94  Resp: 15 15  19   Temp:   98.7 F (37.1 C)   TempSrc:   Oral   SpO2: 96% 97%  96%  Weight:      Height:        Intake/Output Summary (Last 24 hours) at 06/07/2019 0837 Last data filed at 06/07/2019 0600 Gross per 24 hour  Intake 400.68 ml  Output 750 ml  Net -349.32 ml   Last 3 Weights 06/07/2019 06/06/2019 01/28/2019  Weight (lbs) 179 lb 14.3 oz 180 lb 189 lb  Weight (kg) 81.6 kg 81.647 kg 85.73 kg      Telemetry    Sinus, rate 65-75 bpm. PVCs. Several short runs of NSVT - Personally Reviewed  ECG    - Personally Reviewed  Physical Exam   GEN: No acute distress.   Neck: No JVD Cardiac: RRR, no murmurs, rubs, or gallops.  Respiratory: Clear to auscultation bilaterally. GI: Soft, nontender, non-distended  MS: No edema; No deformity. Neuro:  Nonfocal  Psych: Normal affect   Labs    High Sensitivity Troponin:   Recent Labs  Lab 06/06/19 2021 06/06/19 2215  TROPONINIHS 50.00* 82.00*       Cardiac EnzymesNo results for input(s): TROPONINI in the last 168 hours. No results for input(s): TROPIPOC in the last 168 hours.   Chemistry Recent Labs  Lab 06/06/19 2021 06/07/19 0251  NA 140 140  K 3.6 3.6  CL 105 105  CO2 25 22  GLUCOSE 159* 165*  BUN 12 9  CREATININE 0.75 0.71  CALCIUM 9.2 9.1  GFRNONAA >60 >60  GFRAA >60 >60  ANIONGAP 10 13     Hematology Recent Labs  Lab 06/06/19 2021 06/07/19 0251  WBC 8.8 9.5  RBC 5.13 5.03  HGB 16.2 15.6  HCT 47.0 45.4  MCV 91.6 90.3  MCH 31.6 31.0  MCHC 34.5 34.4  RDW 12.4 12.0  PLT 131* 134*    BNPNo results for input(s): BNP, PROBNP in the last 168 hours.   DDimer No results for input(s): DDIMER in the last 168 hours.   Radiology    Dg Chest Portable 1 View  Result Date: 06/06/2019 CLINICAL DATA:  58 y/o M; intermittent left-sided chest pain radiating to the left arm. EXAM: PORTABLE CHEST 1 VIEW COMPARISON:  01/12/2019 chest radiograph FINDINGS: Stable heart size and mediastinal contours are  within normal limits. Both lungs are clear. The visualized skeletal structures are unremarkable. IMPRESSION: No active disease. Electronically Signed   By: Mitzi HansenLance  Furusawa-Stratton M.D.   On: 06/06/2019 20:52    Cardiac Studies   Cardiac cath 06/06/19:  Ramus lesion is 40% stenosed.  Previously placed Prox RCA to Mid RCA stent (unknown type) is widely patent.  RPAV lesion is 100% stenosed.  A drug-eluting stent was successfully placed using a STENT RESOLUTE ONYX 2.0X18.  Post intervention, there is a 0% residual stenosis.  There is mild to moderate left ventricular systolic dysfunction.  LV end diastolic pressure is normal.  The left ventricular ejection fraction is 50-55% by visual estimate.  Diagnostic Dominance: Right  Intervention     Patient Profile     58 y.o. male with history of HTN, HLD, tobacco abuse, diabetes and CAD with prior stenting of the RCA in 2007 who was admitted late on 06/06/19 with an  acute inferior STEMI. The right posterolateral branch was occluded and treated with a drug eluting stent.   Assessment & Plan    1. CAD/Inferior STEMI: Doing well this am. No chest pain. Will continue DAPT with ASA and Brilinta for one year. Increase Toprol to 25 mg daily. Continue statin. Continue Ace-inh. Echo later today.   2. HTN: BP controlled.   3. HLD: Continue statin  4. DM: Blood glucose controlled. Continue glipizide.   Will transfer to telemetry unit today. Anticipate fast track with discharge tomorrow.   For questions or updates, please contact [facility] Please consult [email or web address] for contact info under        Signed, Verne Carrowhristopher , MD  06/07/2019, 8:37 AM

## 2019-06-07 NOTE — ED Notes (Signed)
Pt shaved at cath lab insertion sites

## 2019-06-08 ENCOUNTER — Telehealth: Payer: Self-pay | Admitting: Student

## 2019-06-08 ENCOUNTER — Encounter (HOSPITAL_COMMUNITY): Payer: Self-pay | Admitting: Cardiology

## 2019-06-08 LAB — GLUCOSE, CAPILLARY
Glucose-Capillary: 152 mg/dL — ABNORMAL HIGH (ref 70–99)
Glucose-Capillary: 125 mg/dL — ABNORMAL HIGH (ref 70–99)
Glucose-Capillary: 136 mg/dL — ABNORMAL HIGH (ref 70–99)

## 2019-06-08 MED ORDER — ATORVASTATIN CALCIUM 80 MG PO TABS: 80.0000 mg | ORAL_TABLET | Freq: Every day | ORAL | 0 refills | Status: AC

## 2019-06-08 MED ORDER — TICAGRELOR 90 MG PO TABS: 90.0000 mg | ORAL_TABLET | Freq: Two times a day (BID) | ORAL | 2 refills | Status: AC

## 2019-06-08 MED FILL — BRILINTA 90 MG TABLET: 90 | 30 days supply | Qty: 60 | Fill #0

## 2019-06-08 MED FILL — ATORVASTATIN CALCIUM 80 MG: 80 | 90 days supply | Qty: 90 | Fill #0

## 2019-06-08 MED FILL — Verapamil HCl IV Soln 2.5 MG/ML: INTRAVENOUS | Qty: 2 | Status: AC

## 2019-06-08 MED FILL — Nitroglycerin IV Soln 100 MCG/ML in D5W: INTRA_ARTERIAL | Qty: 10 | Status: AC

## 2019-06-08 NOTE — Care Management (Signed)
Brilinta benefits check sent and pending.  Marce Schartz RN, BSN, NCM-BC, ACM-RN [phone] 

## 2019-06-08 NOTE — Plan of Care (Signed)
Problem: Education: Goal: Understanding of cardiac disease, CV risk reduction, and recovery process will improve 06/08/2019 1216 by Elmarie Shiley, RN Outcome: Completed/Met 06/08/2019 0741 by Elmarie Shiley, RN Outcome: Progressing Goal: Understanding of medication regimen will improve 06/08/2019 1216 by Elmarie Shiley, RN Outcome: Completed/Met 06/08/2019 0741 by Elmarie Shiley, RN Outcome: Progressing Goal: Individualized Educational Video(s) 06/08/2019 1216 by Elmarie Shiley, RN Outcome: Completed/Met 06/08/2019 0741 by Elmarie Shiley, RN Outcome: Not Met (add Reason)   Problem: Activity: Goal: Ability to tolerate increased activity will improve 06/08/2019 1216 by Elmarie Shiley, RN Outcome: Completed/Met 06/08/2019 0741 by Elmarie Shiley, RN Outcome: Progressing   Problem: Cardiac: Goal: Ability to achieve and maintain adequate cardiopulmonary perfusion will improve 06/08/2019 1216 by Elmarie Shiley, RN Outcome: Completed/Met 06/08/2019 0741 by Elmarie Shiley, RN Outcome: Progressing Goal: Vascular access site(s) Level 0-1 will be maintained 06/08/2019 1216 by Elmarie Shiley, RN Outcome: Completed/Met 06/08/2019 0741 by Elmarie Shiley, RN Outcome: Progressing   Problem: Health Behavior/Discharge Planning: Goal: Ability to safely manage health-related needs after discharge will improve Outcome: Completed/Met   Problem: Education: Goal: Knowledge of General Education information will improve Description: Including pain rating scale, medication(s)/side effects and non-pharmacologic comfort measures 06/08/2019 1216 by Elmarie Shiley, RN Outcome: Completed/Met 06/08/2019 0741 by Elmarie Shiley, RN Outcome: Progressing   Problem: Health Behavior/Discharge Planning: Goal: Ability to manage health-related needs will improve 06/08/2019 1216 by Elmarie Shiley, RN Outcome: Completed/Met 06/08/2019 0741 by Elmarie Shiley,  RN Outcome: Progressing   Problem: Clinical Measurements: Goal: Ability to maintain clinical measurements within normal limits will improve 06/08/2019 1216 by Elmarie Shiley, RN Outcome: Completed/Met 06/08/2019 0741 by Elmarie Shiley, RN Outcome: Progressing Goal: Will remain free from infection 06/08/2019 1216 by Elmarie Shiley, RN Outcome: Completed/Met 06/08/2019 0741 by Elmarie Shiley, RN Outcome: Progressing Goal: Diagnostic test results will improve 06/08/2019 1216 by Elmarie Shiley, RN Outcome: Completed/Met 06/08/2019 0741 by Elmarie Shiley, RN Outcome: Progressing Goal: Respiratory complications will improve 06/08/2019 1216 by Elmarie Shiley, RN Outcome: Completed/Met 06/08/2019 0741 by Elmarie Shiley, RN Outcome: Progressing   Problem: Activity: Goal: Risk for activity intolerance will decrease 06/08/2019 1216 by Elmarie Shiley, RN Outcome: Completed/Met 06/08/2019 0741 by Elmarie Shiley, RN Outcome: Progressing   Problem: Nutrition: Goal: Adequate nutrition will be maintained 06/08/2019 1216 by Elmarie Shiley, RN Outcome: Completed/Met 06/08/2019 0741 by Elmarie Shiley, RN Outcome: Progressing   Problem: Coping: Goal: Level of anxiety will decrease 06/08/2019 1216 by Elmarie Shiley, RN Outcome: Completed/Met 06/08/2019 0741 by Elmarie Shiley, RN Outcome: Progressing   Problem: Elimination: Goal: Will not experience complications related to bowel motility Outcome: Completed/Met Goal: Will not experience complications related to urinary retention Outcome: Completed/Met   Problem: Pain Managment: Goal: General experience of comfort will improve 06/08/2019 1216 by Elmarie Shiley, RN Outcome: Completed/Met 06/08/2019 0741 by Elmarie Shiley, RN Outcome: Progressing   Problem: Safety: Goal: Ability to remain free from injury will improve 06/08/2019 1216 by Elmarie Shiley, RN Outcome:  Completed/Met 06/08/2019 0741 by Elmarie Shiley, RN Outcome: Progressing   Problem: Skin Integrity: Goal: Risk for impaired skin integrity will decrease 06/08/2019 1216 by Elmarie Shiley, RN Outcome: Completed/Met 06/08/2019 0741 by Elmarie Shiley, RN Outcome: Progressing  Discharge instructions reviewed with patient.  These included the following:  symptoms of a heart attack, new medications and prescriptions, when to call the  MD, cardiac cath site care, activity restrictions, follow-up appointments, dietary recommendations (especially re:  history of DM), understanding of EMS services and 911, etc.  Comprehension of information presented ascertained via "teach-back" method.  Very impatient to go home and I question his resolve to follow through with instructions / recommendations as he held very poor eye contact and was restless, looking at his cell phone during session.  Please refer to cardiac re-hab nursing note for further.  Patient discharged to private residence.

## 2019-06-08 NOTE — Progress Notes (Signed)
CARDIAC REHAB PHASE I   PRE:  Rate/Rhythm: 62 SR  BP:  Sitting: 126/79      SaO2: 96 RA  MODE:  Ambulation: 370 ft   POST:  Rate/Rhythm: 75 SR  BP:  Sitting: 129/74    SaO2: 97 RA   Pt ambulated 343ft in hallway independently with fast steady gait. Pt denies CP or SOB. Pt educated on importance of ASA, Brilinta, and NTG. Pt given MI book, heart healthy and diabetic diets. Reviewed restrictions, site care, and exercise guidelines. Will refer to CRP [place]. Pt not interested in Virtual Cardiac Rehab. Pt anxious to leave.  [phone] Rufina Falco, RN BSN 06/08/2019 11:43 AM

## 2019-06-08 NOTE — Progress Notes (Signed)
Patient discharged via wheelchair to lobby to await wife, who will be picking him up in the car.  Escorted via wheelchair to exit by nurse tech.  Retrieved home medications from [place] [facility].

## 2019-06-08 NOTE — Telephone Encounter (Signed)
Virtual Visit Pre-Appointment Phone Call  "(Name), I am calling you today to discuss your upcoming appointment. We are currently trying to limit exposure to the virus that causes COVID-19 by seeing patients at home rather than in the office."  1. "What is the BEST phone number to call the day of the visit?" - include this in appointment notes  2. Do you have or have access to (through a family member/friend) a smartphone with video capability that we can use for your visit?" a. If yes - list this number in appt notes as cell (if different from BEST phone #) and list the appointment type as a VIDEO visit in appointment notes b. If no - list the appointment type as a PHONE visit in appointment notes  3. Confirm consent - "In the setting of the current Covid19 crisis, you are scheduled for a (phone or video) visit with your provider on (date) at (time).  Just as we do with many in-office visits, in order for you to participate in this visit, we must obtain consent.  If you'd like, I can send this to your mychart (if signed up) or email for you to review.  Otherwise, I can obtain your verbal consent now.  All virtual visits are billed to your insurance company just like a normal visit would be.  By agreeing to a virtual visit, we'd like you to understand that the technology does not allow for your provider to perform an examination, and thus may limit your provider's ability to fully assess your condition. If your provider identifies any concerns that need to be evaluated in person, we will make arrangements to do so.  Finally, though the technology is pretty good, we cannot assure that it will always work on either your or our end, and in the setting of a video visit, we may have to convert it to a phone-only visit.  In either situation, we cannot ensure that we have a secure connection.  Are you willing to proceed?" STAFF: Did the patient verbally acknowledge consent to telehealth visit? Document  YES/NO here: Yes  4. Advise patient to be prepared - "Two hours prior to your appointment, go ahead and check your blood pressure, pulse, oxygen saturation, and your weight (if you have the equipment to check those) and write them all down. When your visit starts, your provider will ask you for this information. If you have an Apple Watch or Kardia device, please plan to have heart rate information ready on the day of your appointment. Please have a pen and paper handy nearby the day of the visit as well."  5. Give patient instructions for MyChart download to smartphone OR [email or web address] as below if video visit (depending on what platform provider is using)  6. Inform patient they will receive a phone call 15 minutes prior to their appointment time (may be from unknown caller ID) so they should be prepared to answer    [name] has been deemed a candidate for a follow-up tele-health visit to limit community exposure during the Covid-19 pandemic. I spoke with the patient via phone to ensure availability of phone/video source, confirm preferred email & phone number, and discuss instructions and expectations.  I reminded Howard Fleet Sr. to be prepared with any vital sign and/or heart rhythm information that could potentially be obtained via home monitoring, at the time of his visit. I reminded Howard Fleet Sr. to expect a phone  call prior to his visit.  Howard Nelson 06/08/2019 2:06 PM

## 2019-06-08 NOTE — Progress Notes (Signed)
Dr Burt Knack called re:  EKG report for "Acute STEMI".  No reports of chest pain as per patient.  V/S stable.  Patient up and about in room without difficulty.  MD aware.

## 2019-06-08 NOTE — Progress Notes (Signed)
Dr. Copper paged this morning for patients abnormal ECG showing ST elevation and acute MI. Awaiting response from MD.

## 2019-06-08 NOTE — TOC Benefit Eligibility Note (Signed)
Transition of Care [facility]) Benefit Eligibility Note    Patient Details  Name: Howard WICKLUND Sr. MRN: [id number] Date of Birth: 02-Sep-1961   Medication/Dose: BRILINTA  90 MG BID  Covered?: Yes  Tier: (4 DRUG)  Prescription Coverage [facility]  Spoke with Person/Company/Phone Number:: JESSE  @  [facility] [employer] # [phone]  Co-Pay: $8.95  Prior Approval: No  Deductible: Met(LOWE INCOME SUBSIDY)  Additional Notes: TICAGRELOR: Crecencio Mc Phone Number: 06/08/2019, 11:48 AM

## 2019-06-08 NOTE — Progress Notes (Signed)
Progress Note  Patient Name: Howard CLINGER Sr. Date of Encounter: 06/08/2019  Primary Cardiologist: Howard Dolly, MD   Subjective   No chest pain or shortness of breath.  Feels well.  Inpatient Medications    Scheduled Meds:  aspirin  81 mg Oral Daily   atorvastatin  80 mg Oral q1800   Chlorhexidine Gluconate Cloth  6 each Topical Daily   DULoxetine  60 mg Oral QHS   glipiZIDE  10 mg Oral QHS   heparin  5,000 Units Subcutaneous Q8H   lisinopril  2.5 mg Oral Daily   metoprolol succinate  25 mg Oral Daily   pantoprazole  40 mg Oral Daily   sodium chloride flush  3 mL Intravenous Q12H   ticagrelor  90 mg Oral BID   Continuous Infusions:  sodium chloride Stopped (06/07/19 1000)   PRN Meds: sodium chloride, acetaminophen, HYDROcodone-acetaminophen, nitroGLYCERIN, ondansetron (ZOFRAN) IV, sodium chloride flush   Vital Signs    Vitals:   06/08/19 0735 06/08/19 0745 06/08/19 0800 06/08/19 0816  BP: 109/80 121/76 104/88   Pulse: 67 61 77   Resp: '14 16 16   '$ Temp:    98.5 F (36.9 C)  TempSrc:    Oral  SpO2: 97% 95% 98%   Weight:      Height:        Intake/Output Summary (Last 24 hours) at 06/08/2019 0910 Last data filed at 06/08/2019 0900 Gross per 24 hour  Intake 1540 ml  Output 1300 ml  Net 240 ml   Last 3 Weights 06/08/2019 06/07/2019 06/06/2019  Weight (lbs) 175 lb 11.3 oz 179 lb 14.3 oz 180 lb  Weight (kg) 79.7 kg 81.6 kg 81.647 kg      Telemetry    Sinus rhythm, occasional PVC, no ventricular runs - Personally Reviewed  ECG    Sinus bradycardia 58 bpm, evolving changes of recent inferolateral STEMI noted - Personally Reviewed  Physical Exam  Alert, oriented, no distress GEN: No acute distress.   Neck: No JVD Cardiac: RRR, no murmurs, rubs, or gallops.  Respiratory: Clear to auscultation bilaterally. GI: Soft, nontender, non-distended  MS: No edema; No deformity.  Right groin site is clear with no ecchymosis or hematoma. Neuro:  Nonfocal    Psych: Normal affect   Labs    High Sensitivity Troponin:   Recent Labs  Lab 06/06/19 2021 06/06/19 2215  TROPONINIHS 50.00* 82.00*      Cardiac EnzymesNo results for input(s): TROPONINI in the last 168 hours. No results for input(s): TROPIPOC in the last 168 hours.   Chemistry Recent Labs  Lab 06/06/19 2021 06/07/19 0251  NA 140 140  K 3.6 3.6  CL 105 105  CO2 25 22  GLUCOSE 159* 165*  BUN 12 9  CREATININE 0.75 0.71  CALCIUM 9.2 9.1  GFRNONAA >60 >60  GFRAA >60 >60  ANIONGAP 10 13     Hematology Recent Labs  Lab 06/06/19 2021 06/07/19 0251  WBC 8.8 9.5  RBC 5.13 5.03  HGB 16.2 15.6  HCT 47.0 45.4  MCV 91.6 90.3  MCH 31.6 31.0  MCHC 34.5 34.4  RDW 12.4 12.0  PLT 131* 134*    BNPNo results for input(s): BNP, PROBNP in the last 168 hours.   DDimer No results for input(s): DDIMER in the last 168 hours.   Radiology    Dg Chest Portable 1 View  Result Date: 06/06/2019 CLINICAL DATA:  58 y/o M; intermittent left-sided chest pain radiating to the left arm.  EXAM: PORTABLE CHEST 1 VIEW COMPARISON:  01/12/2019 chest radiograph FINDINGS: Stable heart size and mediastinal contours are within normal limits. Both lungs are clear. The visualized skeletal structures are unremarkable. IMPRESSION: No active disease. Electronically Signed   By: Howard Nelson M.D.   On: 06/06/2019 20:52    Cardiac Studies   Echo 06/07/2019: IMPRESSIONS    1. The left ventricle has normal systolic function, with an ejection fraction of 55-60%. The cavity size was normal. There is moderately increased left ventricular wall thickness. Left ventricular diastolic Doppler parameters are consistent with  impaired relaxation. Indeterminate filling pressures The E/e' is 8-15. No evidence of left ventricular regional wall motion abnormalities.  2. Moderate hypokinesis of the left ventricular, basal inferior wall.  3. The right ventricle has normal systolic function. The cavity was  normal. There is no increase in right ventricular wall thickness.  4. The mitral valve is grossly normal.  5. The tricuspid valve is grossly normal.  6. The aortic valve is tricuspid. No stenosis of the aortic valve.  Cath: Conclusion    Ramus lesion is 40% stenosed.  Previously placed Prox RCA to Mid RCA stent (unknown type) is widely patent.  RPAV lesion is 100% stenosed.  A drug-eluting stent was successfully placed using a STENT RESOLUTE ONYX 2.0X18.  Post intervention, there is a 0% residual stenosis.  There is mild to moderate left ventricular systolic dysfunction.  LV end diastolic pressure is normal.  The left ventricular ejection fraction is 50-55% by visual estimate.     CARDIAC CATHETERIZATION / PCI DES RCA-PLA    History obtained from chart review.  Mr. Howard Nelson is a 58 year old married Caucasian male patient of Dr. Roderic Nelson branches who has a history of CAD status post myocardial infarction in 2007 with PCI and stenting of his RCA.  He had catheterization by Dr. and in 2018 revealing widely patent stents with minimal CAD and medical therapy was recommended.  Other problems include tobacco abuse, treated hypertension, diabetes and hyperlipidemia.  He had the onset of chest pain approxi-4 PM yesterday and was seen in any [facility] emergency room where he had submillimeter ST segment elevation.  He was hypertensive and diaphoretic.  He was treated with IV heparin and nitroglycerin as well as aspirin and transported to [facility] where he had more prominent ST segment elevation and because of this was brought to the Cath Lab urgently for diagnostic coronary angiography and intervention.   PROCEDURE DESCRIPTION:   The patient was brought to the second floor [facility] Cardiac cath lab in the postabsorptive state. He was premedicated with IV fentanyl. His right groin was prepped and shaved in usual sterile fashion. Xylocaine 1% was used for local  anesthesia. A 6 French sheath was inserted into the right common femoral  artery using standard Seldinger technique.  5 French right left Judkins diagnostic catheters along with a 5 French pigtail catheter used for selective coronary angiography and left ventriculography respectively.  Visipaque dye was used for the entirety of the case.  Retrograde aorta, ventricular and pullback pressures were recorded.  The patient received Brilinta 180 mg p.o. loading as well as Angiomax bolus followed by infusion with a therapeutic ACT.  Using a 6 [place] guide catheter along with a 0.14 pro-water guidewire and a 2 mm x 12 mm balloon the distal PLA branch which was the "culprit vessel" was crossed and angioplasty.  The door to balloon time was 41 minutes.  I then placed a 2  mm x 18 mm long resolute Onyx drug-eluting stent in the PLA branch just after the takeoff of the PDA and deployed at 14 to 16 atm.  Postdilated with a 2.25 mm x 12 m balloon up to 16 atm (2.3 mm) resulting reduction with total occlusion to 0% residual TIMI-3 flow.  The patient did have an idioventricular reperfusion arrhythmia which resolved fairly quickly.  His chest pain improved and his ST segment elevation improved as well.  The guidewire and catheter were removed and the sheath was sewn securely in place.    IMPRESSION: Successful RCA/PLA PCI and drug-eluting stenting using a resolute Onyx 2 mm x 18 mm long drug-eluting stent in the setting of inferoposterior lateral myocardial infarction with an occluded large PLA system and a door to balloon time of 41 minutes.  The patient has fluoroscopically calcified vessels in the left system.  He has preserved EF in the 45 to 50% range with inferobasal hypokinesia.  He can be "fast tracked " and discharged in 48 hours.  He will need dual antiplatelet therapy with aspirin and Brilinta uninterrupted for 12 months.  We will continue Angiomax full dose for 4 hours.  The patient left lab in stable  condition.  Coronary Diagrams  Diagnostic Dominance: Right  Intervention    Patient Profile     58 y.o. male with history of HTN, HLD, tobacco abuse, diabetes and CAD with prior stenting of the RCA in 2007 who was admitted late on 06/06/19 with an acute inferior STEMI. The right posterolateral branch was occluded and treated with a drug eluting stent.   Assessment & Plan    1.  Acute inferolateral STEMI: The patient is progressing well.  He will continue on dual antiplatelet therapy with aspirin and ticagrelor for 1 year without interruption.  Otherwise on appropriate medical therapy with a beta-blocker, low-dose ACE inhibitor, and high intensity statin drug.  Echo and cardiac catheterization findings demonstrated above with preserved LV systolic function.  Stable for discharge home today.  Reviewed the importance of medication compliance with him.  He was having problems with the medication causing a headache at home.  I reviewed his medications and suspect this is isosorbide.  We will discontinue this.  2.  Hypertension: Blood pressure well controlled on current regimen.  3.  Mixed hyperlipidemia: Continue high intensity statin drug  4.  Type 2 diabetes: Blood glucose controlled on current regimen.   Disposition: Home today.  For questions or updates, please contact [place] Please consult [email or web address] for contact info under     Signed, Sherren Mocha, MD  06/08/2019, 9:10 AM

## 2019-06-08 NOTE — Plan of Care (Signed)
  Problem: Education: Goal: Understanding of cardiac disease, CV risk reduction, and recovery process will improve Outcome: Progressing Goal: Understanding of medication regimen will improve Outcome: Progressing Goal: Individualized Educational Video(s) Outcome: Not Met (add Reason)   Problem: Activity: Goal: Ability to tolerate increased activity will improve Outcome: Progressing   Problem: Cardiac: Goal: Ability to achieve and maintain adequate cardiopulmonary perfusion will improve Outcome: Progressing Goal: Vascular access site(s) Level 0-1 will be maintained Outcome: Progressing   Problem: Education: Goal: Knowledge of General Education information will improve Description: Including pain rating scale, medication(s)/side effects and non-pharmacologic comfort measures Outcome: Progressing   Problem: Health Behavior/Discharge Planning: Goal: Ability to manage health-related needs will improve Outcome: Progressing   Problem: Clinical Measurements: Goal: Ability to maintain clinical measurements within normal limits will improve Outcome: Progressing Goal: Will remain free from infection Outcome: Progressing Goal: Diagnostic test results will improve Outcome: Progressing Goal: Respiratory complications will improve Outcome: Progressing   Problem: Activity: Goal: Risk for activity intolerance will decrease Outcome: Progressing   Problem: Nutrition: Goal: Adequate nutrition will be maintained Outcome: Progressing   Problem: Coping: Goal: Level of anxiety will decrease Outcome: Progressing   Problem: Pain Managment: Goal: General experience of comfort will improve Outcome: Progressing   Problem: Safety: Goal: Ability to remain free from injury will improve Outcome: Progressing   Problem: Skin Integrity: Goal: Risk for impaired skin integrity will decrease Outcome: Progressing   Patient is alert/oriented.  Ambulating in room without pain or dyspnea.  Right  femoral site clear / level 0.

## 2019-06-08 NOTE — Plan of Care (Signed)
  Problem: Cardiac: Goal: Vascular access site(s) Level 0-1 will be maintained Outcome: Progressing   Problem: Cardiac: Goal: Ability to achieve and maintain adequate cardiopulmonary perfusion will improve Outcome: Progressing   Problem: Health Behavior/Discharge Planning: Goal: Ability to safely manage health-related needs after discharge will improve Outcome: Progressing

## 2019-06-08 NOTE — Discharge Summary (Addendum)
Discharge Summary    Patient ID: Howard ShutterBryan L Salada Sr.,  MRN: [id number], DOB/AGE: 07-25-1961 58 y.o.  Admit date: 06/06/2019 Discharge date: 06/08/2019  Primary Care Provider: Richardean Chimeraaniel, Terry G Primary Cardiologist: Dr. Wyline MoodBranch   Discharge Diagnoses    Principal Problem:   ST elevation myocardial infarction involving right coronary artery [facility](HCC) Active Problems:   Hyperlipidemia   TOBACCO ABUSE   Essential hypertension   CORONARY ATHEROSCLEROSIS NATIVE CORONARY ARTERY   CHEST PAIN   Accelerating angina (HCC)   Type 2 diabetes mellitus with complication (HCC)   STEMI (ST elevation myocardial infarction) (HCC)   Allergies Allergies  Allergen Reactions  . Ampicillin Other (See Comments)    Headache, diarrhea  Did it involve swelling of the face/tongue/throat, SOB, or low BP? Unknown Did it involve sudden or severe rash/hives, skin peeling, or any reaction on the inside of your mouth or nose? Unknown Did you need to seek medical attention at a hospital or doctor's office? Unknown When did it last happen?unk If all above answers are "NO", may proceed with cephalosporin use.   Marland Kitchen. Penicillins Diarrhea    .Did it involve swelling of the face/tongue/throat, SOB, or low BP? No Did it involve sudden or severe rash/hives, skin peeling, or any reaction on the inside of your mouth or nose? No Did you need to seek medical attention at a hospital or doctor's office? No When did it last happen?Unknown If all above answers are "NO", may proceed with cephalosporin use.   . Statins Other (See Comments)    [facility]-COA Reductase Inhibitors - elevated liver tests    Diagnostic Studies/Procedures    Cath: 06/07/19   Ramus lesion is 40% stenosed.  Previously placed Prox RCA to Mid RCA stent (unknown type) is widely patent.  RPAV lesion is 100% stenosed.  A drug-eluting stent was successfully placed using a STENT RESOLUTE ONYX 2.0X18.  Post intervention, there is a 0% residual  stenosis.  There is mild to moderate left ventricular systolic dysfunction.  LV end diastolic pressure is normal.  The left ventricular ejection fraction is 50-55% by visual estimate.  IMPRESSION: Successful RCA/PLA PCI and drug-eluting stenting using a resolute Onyx 2 mm x 18 mm long drug-eluting stent in the setting of inferoposterior lateral myocardial infarction with an occluded large PLA system and a door to balloon time of 41 minutes.  The patient has fluoroscopically calcified vessels in the left system.  He has preserved EF in the 45 to 50% range with inferobasal hypokinesia.  He can be "fast tracked " and discharged in 48 hours.  He will need dual antiplatelet therapy with aspirin and Brilinta uninterrupted for 12 months.  We will continue Angiomax full dose for 4 hours.  The patient left lab in stable condition.  Nanetta BattyJonathan Berry. MD, [facility]  Diagnostic Dominance: Right  Intervention    TTE: 06/07/19  IMPRESSIONS    1. The left ventricle has normal systolic function, with an ejection fraction of 55-60%. The cavity size was normal. There is moderately increased left ventricular wall thickness. Left ventricular diastolic Doppler parameters are consistent with  impaired relaxation. Indeterminate filling pressures The E/e' is 8-15. No evidence of left ventricular regional wall motion abnormalities.  2. Moderate hypokinesis of the left ventricular, basal inferior wall.  3. The right ventricle has normal systolic function. The cavity was normal. There is no increase in right ventricular wall thickness.  4. The mitral valve is grossly normal.  5. The tricuspid valve is grossly normal.  6. The aortic valve is tricuspid. No stenosis of the aortic valve. _____________   History of Present Illness     Howard Kinzler. is a 58 y.o. male w/ history of HLD, HTN, smoking, DM2, and CAD (s/p MI and PCI in 2007) who presented with CP.  Patient noted he had a headache all day the day of  admission, which was not typically a problem for him. Around 4pm he then developed chest pain. Pain was in the his center chest with radiation to the L side. Felt like a burning. Much worse than prior chest pain that he had with his MI. Came to [facility] and was found to be hypertensive with systolic blood pressures in the 200's. His ECG revealed subtle ST elevation in inferior leads and his hsTnI was elevated at 50. He was transferred to [facility] due to likely need for emergent angiography.   On arrival to [facility], the patient continued to report severe central chest pain and burning radiating to the left chest. He was placed on a nitroglycerin drip which was increased to bring his systolic pressure down to about . Despite this his chest pain and headache persisted. Blood pressures in both arms were similar. A repeat troponin was 84. He was given aspirin and heparin. A repeat ECG revealed 2mm ST elevation in leads II, III, and aVF. He was brought to the cath lab for emergent angiography.   Notably, the patient has a history of MI back in 2007 and had RCA stents placed at that time. He subsequently underwent diagnostic angiography in 2018 after presenting with crescendo angina, however he was not found to have obstructive epicardial coronary disease at that time.   Hospital Course     1.  Acute inferolateral STEMI: Underwent successful PCI/DES x1 100% occluded R PLA lesion. Previously placed proximal to mid RCA stent was widely patent.  Via LV gram he had a preserved EF with 45 to 50% range noted. Along with inferior basal hypokinesis. Troponin 82. He was continued on Aggrastat post cath for 4 hours. Placed on DAPT with aspirin and Brilinta for a minimum of 1 year.  Otherwise on appropriate medical therapy with a beta-blocker, low-dose ACE inhibitor, and high intensity statin drug.  Echo and cardiac catheterization findings demonstrated above with preserved LV systolic  function. Worked well with cardiac rehab without recurrent chest pain.  --Of note Imdur was stopped secondary to complaints of headache.  2.  Hypertension: Blood pressure well controlled on the blocker and low-dose ACE inhibitor  3.  Mixed hyperlipidemia: No lipids obtained this admission.  Home Lipitor was increased from 40 mg daily to 80 mg.  Noted to have a statin allergy listed, but this was secondary to elevated LFTs with [facility]-COA reductase inhibitors. --Will need LFTs and FLP in 6 weeks.  4.  Type 2 diabetes: Hemoglobin A1c 7.8 will continue on home regimen of Jardiance and glipizide.  Follow-up with PCP.  [name] was seen by Dr. Excell Seltzer and determined stable for discharge home. Follow up in the office has been arranged. Medications are listed below.   _____________  Discharge Vitals Blood pressure 104/88, pulse 77, temperature 98.5 F (36.9 C), temperature source Oral, resp. rate 16, height 5' 10.5" (1.791 m), weight 79.7 kg, SpO2 98 %.  Filed Weights   06/06/19 2224 06/07/19 0215 06/08/19 0500  Weight: 81.6 kg 81.6 kg 79.7 kg    Labs & Radiologic Studies    CBC  Recent Labs    06/06/19 2021 06/07/19 0251  WBC 8.8 9.5  HGB 16.2 15.6  HCT 47.0 45.4  MCV 91.6 90.3  PLT 131* 353*   Basic Metabolic Panel Recent Labs    06/06/19 2021 06/07/19 0251  NA 140 140  K 3.6 3.6  CL 105 105  CO2 25 22  GLUCOSE 159* 165*  BUN 12 9  CREATININE 0.75 0.71  CALCIUM 9.2 9.1   Liver Function Tests No results for input(s): AST, ALT, ALKPHOS, BILITOT, PROT, ALBUMIN in the last 72 hours. No results for input(s): LIPASE, AMYLASE in the last 72 hours. Cardiac Enzymes No results for input(s): CKTOTAL, CKMB, CKMBINDEX, TROPONINI in the last 72 hours. BNP Invalid input(s): POCBNP D-Dimer No results for input(s): DDIMER in the last 72 hours. Hemoglobin A1C Recent Labs    06/07/19 0251  HGBA1C 7.8*   Fasting Lipid Panel No results for input(s): CHOL, HDL, LDLCALC,  TRIG, CHOLHDL, LDLDIRECT in the last 72 hours. Thyroid Function Tests No results for input(s): TSH, T4TOTAL, T3FREE, THYROIDAB in the last 72 hours.  Invalid input(s): FREET3 _____________  Dg Chest Portable 1 View  Result Date: 06/06/2019 CLINICAL DATA:  58 y/o M; intermittent left-sided chest pain radiating to the left arm. EXAM: PORTABLE CHEST 1 VIEW COMPARISON:  01/12/2019 chest radiograph FINDINGS: Stable heart size and mediastinal contours are within normal limits. Both lungs are clear. The visualized skeletal structures are unremarkable. IMPRESSION: No active disease. Electronically Signed   By: Kristine Garbe M.D.   On: 06/06/2019 20:52   Disposition   Pt is being discharged home today in good condition.  Follow-up Plans & Appointments    Follow-up Information    Erma Heritage, PA-C Follow up on 06/15/2019.   Specialties: Physician Assistant, Cardiology Why: at 1pm for your follow up appt.  Contact information: [place] [address] [phone]        Caryl Bis, MD Follow up.   Specialty: Family Medicine Why: Please arrange close follow up with PCP regarding continued management of your diabetes.  Contact information: [address] [place] [address] [phone]          Discharge Instructions    Amb Referral to Cardiac Rehabilitation   Complete by: As directed    Diagnosis:  STEMI Coronary Stents     After initial evaluation and assessments completed: Virtual Based Care may be provided alone or in conjunction with Phase 2 Cardiac Rehab based on patient barriers.: No   Call MD for:  redness, tenderness, or signs of infection (pain, swelling, redness, odor or green/yellow discharge around incision site)   Complete by: As directed    Diet - low sodium heart healthy   Complete by: As directed    Discharge instructions   Complete by: As directed    Femoral Site Care Refer to this sheet in the next few weeks. These instructions  provide you with information on caring for yourself after your procedure. Your caregiver may also give you more specific instructions. Your treatment has been planned according to current medical practices, but problems sometimes occur. Call your caregiver if you have any problems or questions after your procedure. HOME CARE INSTRUCTIONS You may shower the day after the procedure.Remove the bandage (dressing) and gently wash the site with plain soap and water.Gently pat the site dry.  Do not apply powder or lotion to the site.  Do not submerge the affected site in water for 3 to 5  days.  Inspect the site at least twice daily.  Do not flex or bend the affected leg for 24 hours.  No lifting over 5 pounds (2.3 kg) for 5 days after your procedure.  Do not drive home if you are discharged the same day of the procedure. Have someone else drive you.  You may drive 24 hours after the procedure unless otherwise instructed by your caregiver.  What to expect: Any bruising will usually fade within 1 to 2 weeks.  Blood that collects in the tissue (hematoma) may be painful to the touch. It should usually decrease in size and tenderness within 1 to 2 weeks.  SEEK IMMEDIATE MEDICAL CARE IF: You have unusual pain at the radial site.  You have redness, warmth, swelling, or pain at the radial site.  You have drainage (other than a small amount of blood on the dressing).  You have chills.  You have a fever or persistent symptoms for more than 72 hours.  You have a fever and your symptoms suddenly get worse.  Your arm becomes pale, cool, tingly, or numb.  You have heavy bleeding from the site. Hold pressure on the site.   PLEASE DO NOT MISS ANY DOSES OF YOUR BRILINTA!!!!! Also keep a log of you blood pressures and bring back to your follow up appt. Please call the office with any questions.   Patients taking blood thinners should generally stay away from medicines like ibuprofen, Advil, Motrin, naproxen, and  Aleve due to risk of stomach bleeding. You may take Tylenol as directed or talk to your primary doctor about alternatives.   Increase activity slowly   Complete by: As directed        Discharge Medications     Medication List    STOP taking these medications   isosorbide mononitrate 30 MG 24 hr tablet Commonly known as: IMDUR     TAKE these medications   aspirin 81 MG EC tablet Take 1 tablet (81 mg total) by mouth daily.   atorvastatin 80 MG tablet Commonly known as: LIPITOR Take 1 tablet (80 mg total) by mouth daily at 6 PM. What changed:   medication strength  how much to take  when to take this   DULoxetine 60 MG capsule Commonly known as: CYMBALTA Take 60 mg by mouth at bedtime.   empagliflozin 25 MG Tabs tablet Commonly known as: JARDIANCE Take 25 mg by mouth daily.   glipiZIDE 10 MG 24 hr tablet Commonly known as: GLUCOTROL XL Take 10 mg by mouth at bedtime.   lisinopril 2.5 MG tablet Commonly known as: ZESTRIL Take 1 tablet (2.5 mg total) by mouth daily.   metoprolol succinate 25 MG 24 hr tablet Commonly known as: TOPROL-XL Take 0.5 tablets (12.5 mg total) by mouth daily.   nitroGLYCERIN 0.4 MG SL tablet Commonly known as: NITROSTAT Place 0.4 mg under the tongue every 5 (five) minutes as needed for chest pain.   omeprazole 20 MG capsule Commonly known as: PRILOSEC Take 20 mg by mouth daily.   ticagrelor 90 MG Tabs tablet Commonly known as: BRILINTA Take 1 tablet (90 mg total) by mouth 2 (two) times daily.        Acute coronary syndrome (MI, NSTEMI, STEMI, etc) this admission?: Yes.     AHA/ACC Clinical Performance & Quality Measures: 1. Aspirin prescribed? - Yes 2. ADP Receptor Inhibitor (Plavix/Clopidogrel, Brilinta/Ticagrelor or Effient/Prasugrel) prescribed (includes medically managed patients)? - Yes 3. Beta Blocker prescribed? - Yes 4. High Intensity Statin (Lipitor  40-80mg  or Crestor 20-40mg ) prescribed? - Yes 5. EF assessed  during THIS hospitalization? - Yes 6. For EF <40%, was ACEI/ARB prescribed? - Yes 7. For EF <40%, Aldosterone Antagonist (Spironolactone or Eplerenone) prescribed? - Not Applicable (EF >/= 40%) 8. Cardiac Rehab Phase II ordered (Included Medically managed Patients)? - Yes   Outstanding Labs/Studies   FLP/LFTs in 6 weeks.   Duration of Discharge Encounter   Greater than 30 minutes including physician time.  Signed, Laverda PageLindsay Roberts NP-C 06/08/2019, 11:58 AM

## 2019-06-15 ENCOUNTER — Telehealth (INDEPENDENT_AMBULATORY_CARE_PROVIDER_SITE_OTHER): Payer: Medicare Other | Admitting: Student

## 2019-06-15 ENCOUNTER — Encounter: Payer: Self-pay | Admitting: Student

## 2019-06-15 ENCOUNTER — Other Ambulatory Visit: Payer: Self-pay

## 2019-06-15 VITALS — BP 117/76

## 2019-06-15 DIAGNOSIS — E785 Hyperlipidemia, unspecified: Secondary | ICD-10-CM

## 2019-06-15 DIAGNOSIS — I25118 Atherosclerotic heart disease of native coronary artery with other forms of angina pectoris: Secondary | ICD-10-CM | POA: Diagnosis not present

## 2019-06-15 DIAGNOSIS — I10 Essential (primary) hypertension: Secondary | ICD-10-CM

## 2019-06-15 DIAGNOSIS — E1169 Type 2 diabetes mellitus with other specified complication: Secondary | ICD-10-CM

## 2019-06-15 NOTE — Patient Instructions (Signed)
Medication Instructions:  Your physician recommends that you continue on your current medications as directed. Please refer to the Current Medication list given to you today.   Labwork: none  Testing/Procedures: None  Follow-Up: Your physician recommends that you schedule a follow-up appointment in: 2-3 months with Dr. Harl Bowie   Any Other Special Instructions Will Be Listed Below (If Applicable).  If you are still having symptoms next week, call office    If you need a refill on your cardiac medications before your next appointment, please call your pharmacy.

## 2019-06-15 NOTE — Progress Notes (Signed)
Virtual Visit via Telephone Note   This visit type was conducted due to national recommendations for restrictions regarding the COVID-19 Pandemic (e.g. social distancing) in an effort to limit this patient's exposure and mitigate transmission in our community.  Due to his co-morbid illnesses, this patient is at least at moderate risk for complications without adequate follow up.  This format is felt to be most appropriate for this patient at this time.  The patient did not have access to video technology/had technical difficulties with video requiring transitioning to audio format only (telephone).  All issues noted in this document were discussed and addressed.  No physical exam could be performed with this format.  Please refer to the patient's chart for his  consent to telehealth for [facility].   Date:  06/15/2019   ID:  Howard NettlesBryan L Gavin Sr., DOB 12-08-1961, MRN [id number]  Patient Location: Home Provider Location: Office  PCP:  Richardean Chimeraaniel, Terry G, MD  Cardiologist:  Dina RichBranch, Jonathan, MD  Electrophysiologist:  None   Evaluation Performed:  Follow-Up Visit  Chief Complaint:  Intermittent Chest Pain  History of Present Illness:    Marin ShutterBryan L Kim Sr. is a 58 y.o. male with past medical history of CAD (s/p stenting of proximal and mid-RCA in 2007, patent stents by cath in 2018), HTN, HLD, Type 2 DM, and continued tobacco use who presents for a hospital follow-up telehealth visit.   He most recently presented to [facility] ED on 06/06/2019 for evaluation of chest pain which had started earlier that day. SBP was found to be elevated in the 200's and EKG showed subtle ST elevation along the inferior leads. Initial HS Troponin was 50 with repeat at 82. He was taken for emergent cath given his EKG changes and this showed 40% RI, patent Prox RCA stent, and 100% RPAV stenosis which was treated with a Onyx 2 mm x 18 mm DES. EF was estimated at 45 to 50% range with inferobasal hypokinesia and  echocardiogram the following morning showed a preserved EF of 55-60%. He was started on DAPT with ASA and Brilinta along with being continued on Toprol-XL and Lisinopril. Imdur was discontinued secondary to headaches and Pravastatin was transitioned to Atorvastatin 80mg  daily.   In talking with the patient today, he reports still feeling weak from the hospital stay. Has not been leaving his home due to COVID-19 and the most active thing he has done is walk to his mailbox. Denies any exertional pain with this. Does have intermittent episodes of chest pain at rest which typically resolves within 2-3 minutes. Has not had to utilize SL NTG. Denies any recent dyspnea on exertion, orthopnea, PND, or edema.   Says he has stopped smoking since his admission. Was previously smoking 1.5 ppd.   The patient does not have symptoms concerning for COVID-19 infection (fever, chills, cough, or new shortness of breath).    Past Medical History:  Diagnosis Date   Chronic low back pain    Coronary atherosclerosis of native coronary artery 2007   a. s/p stenting to proximal and mid-RCA in 2007 b. patent stents by cath in 01/2017 c. 05/2019: Inferior STEMI - 40% RI stenosis, patent Prox RCA stent, and 100% RPAV stenosis which was treated with a Onyx 2 mm x 18 mm DES.   Depression    Dyslipidemia    Essential hypertension    Fibromyalgia    GERD (gastroesophageal reflux disease)    Headache    NSTEMI (non-ST elevated myocardial infarction) (  [place])    STEMI (ST elevation myocardial infarction) ([place])    06/06/19 PCI/DES x1 to rPLA, EF normal.    Type 2 diabetes mellitus [facility])    Past Surgical History:  Procedure Laterality Date   APPENDECTOMY     CARDIAC CATHETERIZATION     [id number]   CHOLECYSTECTOMY  2009   CORONARY STENT PLACEMENT     CORONARY/GRAFT ACUTE MI REVASCULARIZATION N/A 06/07/2019   Procedure: Coronary/Graft Acute MI Revascularization;  Surgeon: Lorretta Harp, MD;  Location: [place] [facility];  Service: Cardiovascular;  Laterality: N/A;   KNEE SURGERY Right    LEFT HEART CATH AND CORONARY ANGIOGRAPHY N/A 01/30/2017   Procedure: Left Heart Cath and Coronary Angiography;  Surgeon: Nelva Bush, MD;  Location: [place] [facility];  Service: Cardiovascular;  Laterality: N/A;   LEFT HEART CATH AND CORONARY ANGIOGRAPHY N/A 06/07/2019   Procedure: LEFT HEART CATH AND CORONARY ANGIOGRAPHY;  Surgeon: Lorretta Harp, MD;  Location: [place] [facility];  Service: Cardiovascular;  Laterality: N/A;   NASAL SINUS SURGERY     SHOULDER SURGERY     [phone]   SHOULDER SURGERY     R & L     Current Meds  Medication Sig   aspirin EC 81 MG EC tablet Take 1 tablet (81 mg total) by mouth daily.   atorvastatin (LIPITOR) 80 MG tablet Take 1 tablet (80 mg total) by mouth daily at 6 PM.   DULoxetine (CYMBALTA) 60 MG capsule Take 60 mg by mouth at bedtime.    empagliflozin (JARDIANCE) 25 MG TABS tablet Take 25 mg by mouth daily.   glipiZIDE (GLUCOTROL XL) 10 MG 24 hr tablet Take 10 mg by mouth at bedtime.   lisinopril (PRINIVIL,ZESTRIL) 2.5 MG tablet Take 1 tablet (2.5 mg total) by mouth daily.   metoprolol succinate (TOPROL-XL) 25 MG 24 hr tablet Take 0.5 tablets (12.5 mg total) by mouth daily.   nitroGLYCERIN (NITROSTAT) 0.4 MG SL tablet Place 0.4 mg under the tongue every 5 (five) minutes as needed for chest pain.    omeprazole (PRILOSEC) 20 MG capsule Take 20 mg by mouth daily.    ticagrelor (BRILINTA) 90 MG TABS tablet Take 1 tablet (90 mg total) by mouth 2 (two) times daily.     Allergies:   Ampicillin, Penicillins, and Statins   Social History   Tobacco Use   Smoking status: Current Every Day Smoker    Packs/day: 1.00    Types: Cigarettes    Start date: 12/01/1972   Smokeless tobacco: Never Used   Tobacco comment: 1.5-2 packs per day  Substance Use Topics   Alcohol use: No    Alcohol/week: 0.0 standard drinks    Frequency: Never   Drug use: No      Family Hx: The patient's family history includes Coronary artery disease in his father and mother; Diabetes in his father.  ROS:   Please see the history of present illness.     All other systems reviewed and are negative.   Prior CV studies:   The following studies were reviewed today:  Cardiac Catheterization: 06/07/2019   Ramus lesion is 40% stenosed.  Previously placed Prox RCA to Mid RCA stent (unknown type) is widely patent.  RPAV lesion is 100% stenosed.  A drug-eluting stent was successfully placed using a STENT RESOLUTE ONYX 2.0X18.  Post intervention, there is a 0% residual stenosis.  There is mild to moderate left ventricular systolic dysfunction.  LV end diastolic pressure is normal.  The left ventricular ejection fraction is 50-55% by visual estimate.   IMPRESSION: Successful RCA/PLA PCI and drug-eluting stenting using a resolute Onyx 2 mm x 18 mm long drug-eluting stent in the setting of inferoposterior lateral myocardial infarction with an occluded large PLA system and a door to balloon time of 41 minutes.  The patient has fluoroscopically calcified vessels in the left system.  He has preserved EF in the 45 to 50% range with inferobasal hypokinesia.  He can be "fast tracked " and discharged in 48 hours.  He will need dual antiplatelet therapy with aspirin and Brilinta uninterrupted for 12 months.  We will continue Angiomax full dose for 4 hours.  The patient left lab in stable condition.  Echocardiogram: 06/07/2019 IMPRESSIONS    1. The left ventricle has normal systolic function, with an ejection fraction of 55-60%. The cavity size was normal. There is moderately increased left ventricular wall thickness. Left ventricular diastolic Doppler parameters are consistent with  impaired relaxation. Indeterminate filling pressures The E/e' is 8-15. No evidence of left ventricular regional wall motion abnormalities.  2. Moderate hypokinesis of the left  ventricular, basal inferior wall.  3. The right ventricle has normal systolic function. The cavity was normal. There is no increase in right ventricular wall thickness.  4. The mitral valve is grossly normal.  5. The tricuspid valve is grossly normal.  6. The aortic valve is tricuspid. No stenosis of the aortic valve.   Labs/Other Tests and Data Reviewed:    EKG:  An ECG dated 06/08/2019 was personally reviewed today and demonstrated:  sinus bradycardia, HR 58, with evolving inferior infarct.   Recent Labs: 01/11/2019: ALT 17; Magnesium 1.9 06/07/2019: BUN 9; Creatinine, Ser 0.71; Hemoglobin 15.6; Platelets 134; Potassium 3.6; Sodium 140   Recent Lipid Panel Lab Results  Component Value Date/Time   CHOL 213 (H) 01/29/2017 11:22 PM   TRIG 806 (H) 01/29/2017 11:22 PM   HDL 29 (L) 01/29/2017 11:22 PM   CHOLHDL 7.3 01/29/2017 11:22 PM   LDLCALC UNABLE TO CALCULATE IF TRIGLYCERIDE OVER 400 mg/dL 16/10/960402/22/2018 54:0911:22 PM    Wt Readings from Last 3 Encounters:  06/08/19 175 lb 11.3 oz (79.7 kg)  01/28/19 189 lb (85.7 kg)  01/11/19 185 lb (83.9 kg)     Objective:    Vital Signs:  BP 117/76    General: Male sounding in NAD Psych: Normal affect. Neuro: Alert and oriented X 3. Lungs:  Resp regular and unlabored while talking on the phone.   ASSESSMENT & PLAN:    1. CAD - he is s/p stenting of proximal and mid-RCA in 2007 with patent stents by cath in 2018. Recently admitted for an inferior STEMI and catheterization showed 40% RI stenosis, patent Prox RCA stent, and 100% RPAV stenosis which was treated with a Onyx 2 mm x 18 mm DES. EF was estimated at 45 to 50% range with echocardiogram showing a preserved EF of 55-60%.  - he describes intermittent episodes of chest pain at rest which typically resolves within 2-3 minutes and denies any exertional pain. Has not had to utilize SL NTG. I encouraged him to utilize this if needed. If symptoms do not improve within the next few days, I recommended  he call the office and come in for a repeat EKG.  - continued compliance with ASA and Brilinta encouraged. Remains on Toprol-XL and statin therapy. Intolerant to Imdur in the past secondary to headaches. He has quit smoking and was congratulated on this!  2.  HTN - BP has been well-controlled when checked at home, at 117/76 on most recent check.  - continue Lisinopril 2.5mg  daily and Toprol-XL 12.5mg  daily.   3. HLD - FLP was not obtained during his recent admission. Was switched from Pravastatin to Atorvastatin during admission and needs repeat FLP and LFT's in 6-8 weeks. He informed me had repeat labs scheduled with his PCP at the end of this month. Will request records once available. If not obtained, will obtain at follow-up.   4. Type 2 DM - Hgb A1c at 7.8 during recent admission. Followed by PCP. Remains on Jardiance and Glipizide.    COVID-19 Education: The signs and symptoms of COVID-19 were discussed with the patient and how to seek care for testing (follow up with PCP or arrange E-visit).  The importance of social distancing was discussed today.  Time:   Today, I have spent 17 minutes with the patient with telehealth technology discussing the above problems.     Medication Adjustments/Labs and Tests Ordered: Current medicines are reviewed at length with the patient today.  Concerns regarding medicines are outlined above.   Tests Ordered: No orders of the defined types were placed in this encounter.   Medication Changes: No orders of the defined types were placed in this encounter.   Follow Up:  In Person in 2-3 month(s)  Signed, Ellsworth LennoxBrittany M Camaron Cammack, PA-C  06/15/2019 4:53 PM    [facility]

## 2019-06-23 DIAGNOSIS — M129 Arthropathy, unspecified: Secondary | ICD-10-CM | POA: Diagnosis not present

## 2019-06-23 DIAGNOSIS — Z79899 Other long term (current) drug therapy: Secondary | ICD-10-CM | POA: Diagnosis not present

## 2019-06-23 DIAGNOSIS — Z1159 Encounter for screening for other viral diseases: Secondary | ICD-10-CM | POA: Diagnosis not present

## 2019-06-23 DIAGNOSIS — M5136 Other intervertebral disc degeneration, lumbar region: Secondary | ICD-10-CM | POA: Diagnosis not present

## 2019-06-23 DIAGNOSIS — G894 Chronic pain syndrome: Secondary | ICD-10-CM | POA: Diagnosis not present

## 2019-07-08 DIAGNOSIS — I213 ST elevation (STEMI) myocardial infarction of unspecified site: Secondary | ICD-10-CM | POA: Diagnosis not present

## 2019-07-21 DIAGNOSIS — M5440 Lumbago with sciatica, unspecified side: Secondary | ICD-10-CM | POA: Diagnosis not present

## 2019-07-21 DIAGNOSIS — G8929 Other chronic pain: Secondary | ICD-10-CM | POA: Diagnosis not present

## 2019-07-21 DIAGNOSIS — M5136 Other intervertebral disc degeneration, lumbar region: Secondary | ICD-10-CM | POA: Diagnosis not present

## 2019-07-21 DIAGNOSIS — Z79899 Other long term (current) drug therapy: Secondary | ICD-10-CM | POA: Diagnosis not present

## 2019-07-21 DIAGNOSIS — M5416 Radiculopathy, lumbar region: Secondary | ICD-10-CM | POA: Diagnosis not present

## 2019-08-25 DIAGNOSIS — E8881 Metabolic syndrome: Secondary | ICD-10-CM | POA: Diagnosis not present

## 2019-08-25 DIAGNOSIS — M797 Fibromyalgia: Secondary | ICD-10-CM | POA: Diagnosis not present

## 2019-08-25 DIAGNOSIS — E1165 Type 2 diabetes mellitus with hyperglycemia: Secondary | ICD-10-CM | POA: Diagnosis not present

## 2019-08-25 DIAGNOSIS — I10 Essential (primary) hypertension: Secondary | ICD-10-CM | POA: Diagnosis not present

## 2019-08-25 DIAGNOSIS — Z79891 Long term (current) use of opiate analgesic: Secondary | ICD-10-CM | POA: Diagnosis not present

## 2019-08-25 DIAGNOSIS — F17210 Nicotine dependence, cigarettes, uncomplicated: Secondary | ICD-10-CM | POA: Diagnosis not present

## 2019-08-25 DIAGNOSIS — E1159 Type 2 diabetes mellitus with other circulatory complications: Secondary | ICD-10-CM | POA: Diagnosis not present

## 2019-08-25 DIAGNOSIS — K210 Gastro-esophageal reflux disease with esophagitis: Secondary | ICD-10-CM | POA: Diagnosis not present

## 2019-08-25 DIAGNOSIS — E782 Mixed hyperlipidemia: Secondary | ICD-10-CM | POA: Diagnosis not present

## 2019-08-25 DIAGNOSIS — I213 ST elevation (STEMI) myocardial infarction of unspecified site: Secondary | ICD-10-CM | POA: Diagnosis not present

## 2019-08-26 DIAGNOSIS — M5416 Radiculopathy, lumbar region: Secondary | ICD-10-CM | POA: Diagnosis not present

## 2019-08-26 DIAGNOSIS — M5136 Other intervertebral disc degeneration, lumbar region: Secondary | ICD-10-CM | POA: Diagnosis not present

## 2019-08-26 DIAGNOSIS — M5440 Lumbago with sciatica, unspecified side: Secondary | ICD-10-CM | POA: Diagnosis not present

## 2019-08-26 DIAGNOSIS — G8929 Other chronic pain: Secondary | ICD-10-CM | POA: Diagnosis not present

## 2019-08-26 DIAGNOSIS — Z79899 Other long term (current) drug therapy: Secondary | ICD-10-CM | POA: Diagnosis not present

## 2019-08-31 DIAGNOSIS — F331 Major depressive disorder, recurrent, moderate: Secondary | ICD-10-CM | POA: Diagnosis not present

## 2019-08-31 DIAGNOSIS — G252 Other specified forms of tremor: Secondary | ICD-10-CM | POA: Diagnosis not present

## 2019-08-31 DIAGNOSIS — Z6824 Body mass index (BMI) 24.0-24.9, adult: Secondary | ICD-10-CM | POA: Diagnosis not present

## 2019-08-31 DIAGNOSIS — Z0001 Encounter for general adult medical examination with abnormal findings: Secondary | ICD-10-CM | POA: Diagnosis not present

## 2019-08-31 DIAGNOSIS — Z23 Encounter for immunization: Secondary | ICD-10-CM | POA: Diagnosis not present

## 2019-08-31 DIAGNOSIS — Z79891 Long term (current) use of opiate analgesic: Secondary | ICD-10-CM | POA: Diagnosis not present

## 2019-08-31 DIAGNOSIS — F17210 Nicotine dependence, cigarettes, uncomplicated: Secondary | ICD-10-CM | POA: Diagnosis not present

## 2019-08-31 DIAGNOSIS — Z1212 Encounter for screening for malignant neoplasm of rectum: Secondary | ICD-10-CM | POA: Diagnosis not present

## 2019-09-26 DIAGNOSIS — M5136 Other intervertebral disc degeneration, lumbar region: Secondary | ICD-10-CM | POA: Diagnosis not present

## 2019-09-26 DIAGNOSIS — Z79899 Other long term (current) drug therapy: Secondary | ICD-10-CM | POA: Diagnosis not present

## 2019-09-26 DIAGNOSIS — M5440 Lumbago with sciatica, unspecified side: Secondary | ICD-10-CM | POA: Diagnosis not present

## 2019-09-26 DIAGNOSIS — G8929 Other chronic pain: Secondary | ICD-10-CM | POA: Diagnosis not present

## 2019-09-26 DIAGNOSIS — M5416 Radiculopathy, lumbar region: Secondary | ICD-10-CM | POA: Diagnosis not present

## 2019-09-28 IMAGING — CR PORTABLE CHEST - 1 VIEW
1 series · 2 of 2 positions shown · non-contrast
Comparison: 01/12/2019 chest radiograph

CLINICAL DATA: 57 y/o M; intermittent left-sided chest pain
radiating to the left arm.

EXAM:
PORTABLE CHEST 1 VIEW

[Series 1: portable · 0.17mm/px · 2 of 2 slices shown]
[im 1/2]
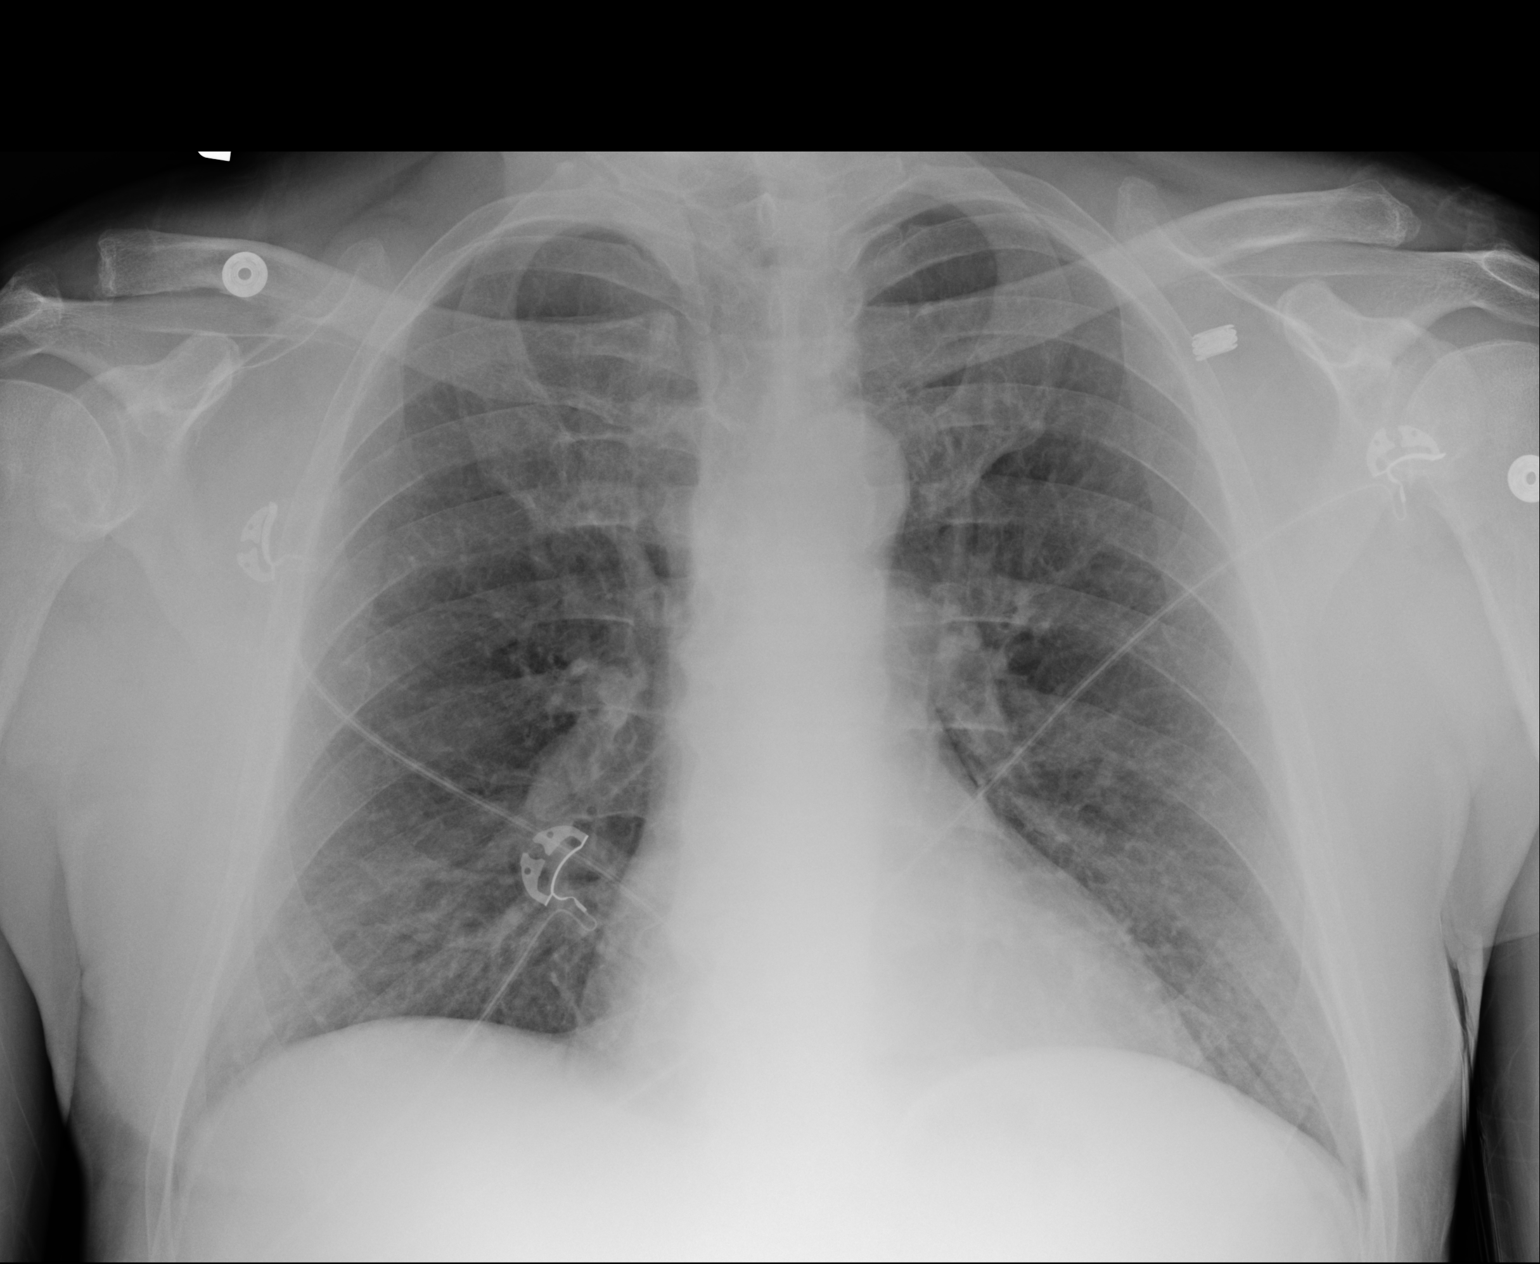
[im 2/2]
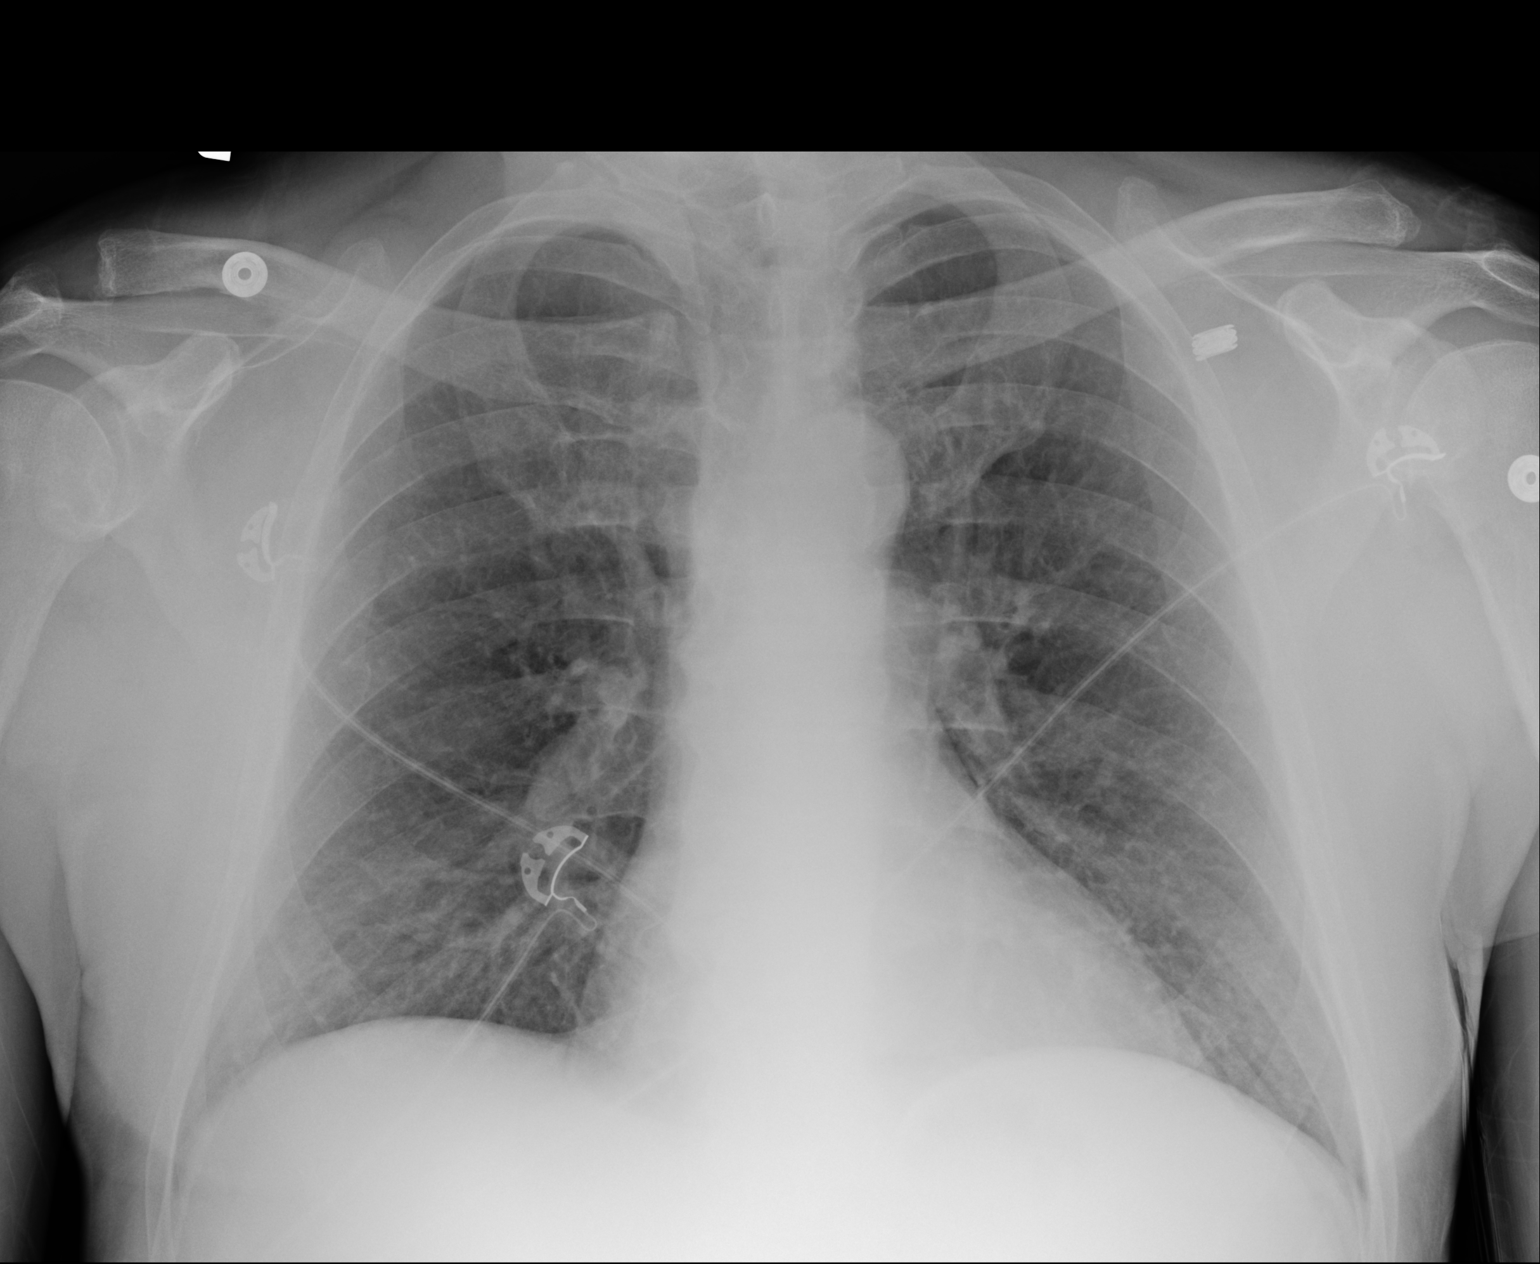

[2 of 2 positions shown; findings below may reference images not displayed]

FINDINGS: Stable heart size and mediastinal contours are within normal limits.
Both lungs are clear. The visualized skeletal structures are
unremarkable.
IMPRESSION: No active disease.

## 2019-10-03 ENCOUNTER — Ambulatory Visit: Payer: Medicare Other | Admitting: Cardiology

## 2019-10-03 NOTE — Progress Notes (Deleted)
Clinical Summary Mr. Vick is a 58 y.o.male seen today for follow up of the following medical problems.   1. CAD - hx of RCA stent in 2007. Repeat cath 04/2008 with patent coronaries, LVEF 50%.  - reports Nov 2014 at [facility] stent placed  - no chest pain, no SOB, no DOE.  - compliant with meds.   05/2019 admitted with chest pain and ST elevations. Cath as reported below, received a DES to the RPAV. Echo showed LVEF 55-60%.  - headaches on imdur, stopped -    2. HTN - does not check regularly - compliant with meds  3. Hyperlipidemia Jan 2016 TC 226 TG 238 HDL 36 LDL 142 - not on statin, reports history of elevated LFTs on therapy. Has been on zetia. LFTs remain elevated   Past Medical History:  Diagnosis Date  . Chronic low back pain   . Coronary atherosclerosis of native coronary artery 2007   a. s/p stenting to proximal and mid-RCA in 2007 b. patent stents by cath in 01/2017 c. 05/2019: Inferior STEMI - 40% RI stenosis, patent Prox RCA stent, and 100% RPAV stenosis which was treated with a Onyx 2 mm x 18 mm DES.  . Depression   . Dyslipidemia   . Essential hypertension   . Fibromyalgia   . GERD (gastroesophageal reflux disease)   . Headache   . NSTEMI (non-ST elevated myocardial infarction) (HCC)   . STEMI (ST elevation myocardial infarction) (HCC)    06/06/19 PCI/DES x1 to rPLA, EF normal.   . Type 2 diabetes mellitus (HCC)      Allergies  Allergen Reactions  . Ampicillin Other (See Comments)    Headache, diarrhea  Did it involve swelling of the face/tongue/throat, SOB, or low BP? Unknown Did it involve sudden or severe rash/hives, skin peeling, or any reaction on the inside of your mouth or nose? Unknown Did you need to seek medical attention at a hospital or doctor's office? Unknown When did it last happen?unk If all above answers are "NO", may proceed with cephalosporin use.   Marland Kitchen Penicillins Diarrhea    .Did it involve swelling of the  face/tongue/throat, SOB, or low BP? No Did it involve sudden or severe rash/hives, skin peeling, or any reaction on the inside of your mouth or nose? No Did you need to seek medical attention at a hospital or doctor's office? No When did it last happen?Unknown If all above answers are "NO", may proceed with cephalosporin use.   . Statins Other (See Comments)    HMG-COA Reductase Inhibitors - elevated liver tests     Current Outpatient Medications  Medication Sig Dispense Refill  . aspirin EC 81 MG EC tablet Take 1 tablet (81 mg total) by mouth daily. 30 tablet 3  . atorvastatin (LIPITOR) 80 MG tablet Take 1 tablet (80 mg total) by mouth daily at 6 PM. 90 tablet 0  . DULoxetine (CYMBALTA) 60 MG capsule Take 60 mg by mouth at bedtime.     . empagliflozin (JARDIANCE) 25 MG TABS tablet Take 25 mg by mouth daily.    Marland Kitchen glipiZIDE (GLUCOTROL XL) 10 MG 24 hr tablet Take 10 mg by mouth at bedtime.    Marland Kitchen lisinopril (PRINIVIL,ZESTRIL) 2.5 MG tablet Take 1 tablet (2.5 mg total) by mouth daily. 90 tablet 3  . metoprolol succinate (TOPROL-XL) 25 MG 24 hr tablet Take 0.5 tablets (12.5 mg total) by mouth daily. 90 tablet 3  . nitroGLYCERIN (NITROSTAT) 0.4 MG SL tablet  Place 0.4 mg under the tongue every 5 (five) minutes as needed for chest pain.     Marland Kitchen. omeprazole (PRILOSEC) 20 MG capsule Take 20 mg by mouth daily.     . ticagrelor (BRILINTA) 90 MG TABS tablet Take 1 tablet (90 mg total) by mouth 2 (two) times daily. 180 tablet 2   No current facility-administered medications for this visit.      Past Surgical History:  Procedure Laterality Date  . APPENDECTOMY    . CARDIAC CATHETERIZATION     [phone]  . CHOLECYSTECTOMY  2009  . CORONARY STENT PLACEMENT    . CORONARY/GRAFT ACUTE MI REVASCULARIZATION N/A 06/07/2019   Procedure: Coronary/Graft Acute MI Revascularization;  Surgeon: Runell GessBerry, Aina Rossbach J, MD;  [facility];  Service: Cardiovascular;  Laterality: N/A;  . KNEE SURGERY  Right   . LEFT HEART CATH AND CORONARY ANGIOGRAPHY N/A 01/30/2017   Procedure: Left Heart Cath and Coronary Angiography;  Surgeon: Yvonne Kendallhristopher End, MD;  [facility];  Service: Cardiovascular;  Laterality: N/A;  . LEFT HEART CATH AND CORONARY ANGIOGRAPHY N/A 06/07/2019   Procedure: LEFT HEART CATH AND CORONARY ANGIOGRAPHY;  Surgeon: Runell GessBerry, Barry Faircloth J, MD;  Location: [facility] INVASIVE CV LAB;  Service: Cardiovascular;  Laterality: N/A;  . NASAL SINUS SURGERY    . SHOULDER SURGERY     [phone]  . SHOULDER SURGERY     R & L     Allergies  Allergen Reactions  . Ampicillin Other (See Comments)    Headache, diarrhea  Did it involve swelling of the face/tongue/throat, SOB, or low BP? Unknown Did it involve sudden or severe rash/hives, skin peeling, or any reaction on the inside of your mouth or nose? Unknown Did you need to seek medical attention at a hospital or doctor's office? Unknown When did it last happen?unk If all above answers are "NO", may proceed with cephalosporin use.   Marland Kitchen. Penicillins Diarrhea    .Did it involve swelling of the face/tongue/throat, SOB, or low BP? No Did it involve sudden or severe rash/hives, skin peeling, or any reaction on the inside of your mouth or nose? No Did you need to seek medical attention at a hospital or doctor's office? No When did it last happen?Unknown If all above answers are "NO", may proceed with cephalosporin use.   . Statins Other (See Comments)    HMG-COA Reductase Inhibitors - elevated liver tests      Family History  Problem Relation Age of Onset  . Coronary artery disease Mother   . Coronary artery disease Father   . Diabetes Father      Social History Mr. Neil CrouchWinn reports that he has been smoking cigarettes. He started smoking about 46 years ago. He has been smoking about 1.00 pack per day. He has never used smokeless tobacco. Mr. Neil CrouchWinn reports no history of alcohol use.   Review of Systems  CONSTITUTIONAL: No weight loss, fever, chills, weakness or fatigue.  HEENT: Eyes: No visual loss, blurred vision, double vision or yellow sclerae.No hearing loss, sneezing, congestion, runny nose or sore throat.  SKIN: No rash or itching.  CARDIOVASCULAR:  RESPIRATORY: No shortness of breath, cough or sputum.  GASTROINTESTINAL: No anorexia, nausea, vomiting or diarrhea. No abdominal pain or blood.  GENITOURINARY: No burning on urination, no polyuria NEUROLOGICAL: No headache, dizziness, syncope, paralysis, ataxia, numbness or tingling in the extremities. No change in bowel or bladder control.  MUSCULOSKELETAL: No muscle, back pain, joint pain or stiffness.  LYMPHATICS: No  enlarged nodes. No history of splenectomy.  PSYCHIATRIC: No history of depression or anxiety.  ENDOCRINOLOGIC: No reports of sweating, cold or heat intolerance. No polyuria or polydipsia.  Marland Kitchen   Physical Examination There were no vitals filed for this visit. There were no vitals filed for this visit.  Gen: resting comfortably, no acute distress HEENT: no scleral icterus, pupils equal round and reactive, no palptable cervical adenopathy,  CV Resp: Clear to auscultation bilaterally GI: abdomen is soft, non-tender, non-distended, normal bowel sounds, no hepatosplenomegaly MSK: extremities are warm, no edema.  Skin: warm, no rash Neuro:  no focal deficits Psych: appropriate affect   Diagnostic Studies 11/2006 Cath FINDINGS: Aortic pressure 112/71 with mean of 89. Left ventricular pressures 107/4 with an end-diastolic pressure of 16.  CORONARY ANGIOGRAPHY: The left main stem is angiographically normal and bifurcates into the LAD and left circumflex.  The LAD is a large-caliber vessel that courses down the left ventricular apex. It has no significant angiographic disease throughout it's course. There are two diagonal branches, the first of which is a large caliber. There is a 50% ostial stenosis of  the first diagonal. The second diagonal is medium caliber and is angiographically normal.  The left circumflex system is medium caliber. It gives off two marginal branches. The first marginal is medium caliber and has a 50% ostial stenosis. There is normal flow throughout that vessel. Second obtuse marginal is angiographically normal. The true AV groove circumflex is small caliber.  The right coronary artery is a large dominant vessel. It is tortuous in its proximal portion. It gives off a large PDA Georgio Hattabaugh distally. It gives off a posterior AV segment that has four posterolateral branches. The mid right coronary artery has a severe focal stenosis of the least 95%. There is an RV marginal Nora Sabey that arises from this region of high-grade stenosis. There is TIMI III flow throughout the vessel.  Left ventriculogram demonstrates low normal left ventricular function with an LVEF of 55%.  ASSESSMENT: 1. Severe single-vessel coronary artery disease with high-grade focal  stenosis of the mid-right coronary artery. 2. Normal left ventricular function.  PLAN: As described above, PCI of the mid-right coronary artery was performed with a Liberte bare metal stent. This was post-dilated with a 4-0 noncompliant balloon, with an excellent angiographic result. The RV marginal Darnise Montag did have compromised flow following the procedure. The RV marginal Herminia Warren is approximately a 2.0-mm vessel, and I elected not to mechanically intervene on this vessel. Will place Mr.Deberry on a nitroglycerin drip and will treat him medically. I do not think he will have long-term sequelae from his compromised RV marginal Broughton Eppinger, and his right coronary artery is a very large vessel that I did not want to risk with treating the side Khup Sapia. He has been pretreated with clopidogrel. He should receive dual anti-platelet therapy with aspirin and clopidogrel. He needs  indefinite aspirin and clopidogrel for a minimum of 30 days. He would benefit from 1 year of clopidogrel, based on the cure data for non-stemi.  11/2006 Cath HEMODYNAMICS: AO 102/86.  CORONARIES: The left main was normal. The LAD had ostial 25% stenosis. There was a mid 25% stenosis. There was a moderate-size first diagonal which had ostial 25-30% stenosis. Second diagonal was small and normal. The circumflex essentially had a very large branching OM1. There was some proximal stenosis of 25%. The right coronary artery is a large dominant vessel. There were diffuse proximal luminal irregularities. There was a stented area across an acute marginal.  This was widely patent. The acute marginal was also patent. There was a large PDA which was normal.  LEFT VENTRICLE: I did not cross the AO and did not do an LV injection.  CONCLUSION: Widely patent right coronary artery stent.  PLAN: The etiology for the patient's chest pain is not clear. He will continue with secondary risk reduction and further evaluation by his primary care doctor.   10/2013 [facility] Cath   Equipment used:  1. 6 [place] JR 4 guide without sideholes  2. Short BMW wire  3. 2.5 x 12 mm trek balloon  4. 3.5 x 38 mm Promus premiere drug-eluting stent  5. 3.75 x 12 mm [place] trek balloon    Medications used:  1. Angiomax IV  2. Brilinta 180 mg p.o.  3. Intracoronary nitroglycerin    Description of procedure:    After diagnostic angiograms were performed a JR 4 guide was brought up   into the ostium of the right coronary artery provided adequate backup   support. The vessel was wired with a short BMW wire. A 2.5 x 12 mm trek   balloon was brought down and used to   predilate the lesion for a total of 40 seconds the maximum of 12   atmospheres with 2 inflations.    Next a 3.5 x 38 mm Promus premiere drug-eluting stent was deployed in the   proximal the mid  right coronary artery at 16 atmospheres for 30 seconds.   Subsequently the stent was postdilated with a 3.75 x 12 mm [place] trek balloon   with 3 inflations a maximum of   18 atmospheres for a total of 60 seconds.    Finally the balloon and wire were removed. Final angiogram show no   evidence of stent malposition, vessel perforation, or dissection. There is   TIMI 3 flow down the vessel. The patient was chest pain-free.    A TR band was placed in the right wrist for hemostasis.    Conclusions  1. Successful PCI to the proximal right coronary artery as above with a   drug-eluting stent. Lesion length was 34 mm eccentric mild calcium no   thrombus. Lesion went from 80% pre-to 0% post residual. Pre-TIMI flow is   3, post TIMI flow is 3.  2. Right radial access.   05/2019 cath  [place] lesion is 40% stenosed.  Previously placed Prox RCA to Mid RCA stent (unknown type) is widely patent.  RPAV lesion is 100% stenosed.  A drug-eluting stent was successfully placed using a STENT RESOLUTE ONYX 2.0X18.  Post intervention, there is a 0% residual stenosis.  There is mild to moderate left ventricular systolic dysfunction.  LV end diastolic pressure is normal.  The left ventricular ejection fraction is 50-55% by visual estimate.   05/2019 echo IMPRESSIONS    1. The left ventricle has normal systolic function, with an ejection fraction of 55-60%. The cavity size was normal. There is moderately increased left ventricular wall thickness. Left ventricular diastolic Doppler parameters are consistent with  impaired relaxation. Indeterminate filling pressures The E/e' is 8-15. No evidence of left ventricular regional wall motion abnormalities.  2. Moderate hypokinesis of the left ventricular, basal inferior wall.  3. The right ventricle has normal systolic function. The cavity was normal. There is no increase in right ventricular wall thickness.  4. The mitral valve is grossly  normal.  5. The tricuspid valve is grossly normal.  6. The aortic valve is tricuspid. No stenosis of the  aortic valve.  Assessment and Plan  1. CAD - no current symptoms, continue risk factor modification and secondary prevention - can d/c effient  2. HTN - at goal, continue current meds  3. HL - has not been on statin due to LFT elevation in the past - LFTs remain elevated. There is association with zetia and elevated LFTs as well, will d/c zetia  F/u 1 year      Arnoldo Lenis, M.D.

## 2019-10-25 DIAGNOSIS — Z1159 Encounter for screening for other viral diseases: Secondary | ICD-10-CM | POA: Diagnosis not present

## 2019-10-25 DIAGNOSIS — M5136 Other intervertebral disc degeneration, lumbar region: Secondary | ICD-10-CM | POA: Diagnosis not present

## 2019-10-25 DIAGNOSIS — Z79899 Other long term (current) drug therapy: Secondary | ICD-10-CM | POA: Diagnosis not present

## 2019-10-25 DIAGNOSIS — G894 Chronic pain syndrome: Secondary | ICD-10-CM | POA: Diagnosis not present

## 2019-12-21 ENCOUNTER — Telehealth: Payer: Self-pay | Admitting: Cardiology

## 2019-12-21 NOTE — Telephone Encounter (Signed)

## 2019-12-28 ENCOUNTER — Encounter: Payer: Self-pay | Admitting: Cardiology

## 2019-12-28 ENCOUNTER — Telehealth (INDEPENDENT_AMBULATORY_CARE_PROVIDER_SITE_OTHER): Payer: Medicare Other | Admitting: Cardiology

## 2019-12-28 VITALS — Ht 70.5 in | Wt 160.0 lb

## 2019-12-28 DIAGNOSIS — E782 Mixed hyperlipidemia: Secondary | ICD-10-CM

## 2019-12-28 DIAGNOSIS — I10 Essential (primary) hypertension: Secondary | ICD-10-CM

## 2019-12-28 DIAGNOSIS — I2510 Atherosclerotic heart disease of native coronary artery without angina pectoris: Secondary | ICD-10-CM

## 2019-12-28 NOTE — Patient Instructions (Signed)

## 2019-12-28 NOTE — Progress Notes (Signed)
Virtual Visit via Telephone Note   This visit type was conducted due to national recommendations for restrictions regarding the COVID-19 Pandemic (e.g. social distancing) in an effort to limit this patient's exposure and mitigate transmission in our community.  Due to his co-morbid illnesses, this patient is at least at moderate risk for complications without adequate follow up.  This format is felt to be most appropriate for this patient at this time.  The patient did not have access to video technology/had technical difficulties with video requiring transitioning to audio format only (telephone).  All issues noted in this document were discussed and addressed.  No physical exam could be performed with this format.  Please refer to the patient's chart for his  consent to telehealth for [facility].   Date:  12/28/2019   ID:  Howard Nettles Sr., DOB [birth year], MRN [id number]  Patient Location: Home Provider Location: Home  PCP:  Richardean Chimera, MD  Cardiologist:  Dina Rich, MD  Electrophysiologist:  None   Evaluation Performed:  Follow-Up Visit  Chief Complaint:  Follow up  History of Present Illness:    Howard Nelson. is a 59 y.o. male with seen today for follow up of the following medical problems.   1. CAD - hx of RCA stent in 2007. Repeat cath 04/2008 with patent coronaries, LVEF 50%.  - reports Nov 2014 at [facility] stent placed - cath 2018 patent stents - 05/2019 presented with MI, received DES to RPAV. LVEF at that time 55-60%.  - headhaches on imdur   No chest pains, has some dull RUQ abdominal pain at times.  - compliant with meds   2. HTN - compliant with meds - Jan 12 clinic visit 108/78  3. Hyperlipidemia - on atorvastatin. Normal LFTs from labs this month at [facility]  4. Pancreatic mass - followed by [facility]   The patient does not have symptoms concerning for COVID-19 infection (fever, chills, cough, or new shortness of breath).    Past  Medical History:  Diagnosis Date  . Chronic low back pain   . Coronary atherosclerosis of native coronary artery 2007   a. s/p stenting to proximal and mid-RCA in 2007 b. patent stents by cath in 01/2017 c. 05/2019: Inferior STEMI - 40% RI stenosis, patent Prox RCA stent, and 100% RPAV stenosis which was treated with a Onyx 2 mm x 18 mm DES.  . Depression   . Dyslipidemia   . Essential hypertension   . Fibromyalgia   . GERD (gastroesophageal reflux disease)   . Headache   . NSTEMI (non-ST elevated myocardial infarction) (HCC)   . STEMI (ST elevation myocardial infarction) (HCC)    06/06/19 PCI/DES x1 to rPLA, EF normal.   . Type 2 diabetes mellitus (HCC)    Past Surgical History:  Procedure Laterality Date  . APPENDECTOMY    . CARDIAC CATHETERIZATION     [phone]  . CHOLECYSTECTOMY  2009  . CORONARY STENT PLACEMENT    . CORONARY/GRAFT ACUTE MI REVASCULARIZATION N/A 06/07/2019   Procedure: Coronary/Graft Acute MI Revascularization;  Surgeon: Runell Gess, MD;  Location: [facility] INVASIVE CV LAB;  Service: Cardiovascular;  Laterality: N/A;  . KNEE SURGERY Right   . LEFT HEART CATH AND CORONARY ANGIOGRAPHY N/A 01/30/2017   Procedure: Left Heart Cath and Coronary Angiography;  Surgeon: Yvonne Kendall, MD;  Location: [facility] INVASIVE CV LAB;  Service: Cardiovascular;  Laterality: N/A;  . LEFT HEART CATH AND CORONARY ANGIOGRAPHY N/A 06/07/2019  Procedure: LEFT HEART CATH AND CORONARY ANGIOGRAPHY;  Surgeon: Runell Gess, MD;  Location: [facility] INVASIVE CV LAB;  Service: Cardiovascular;  Laterality: N/A;  . NASAL SINUS SURGERY    . SHOULDER SURGERY     [phone]  . SHOULDER SURGERY     R & L     No outpatient medications have been marked as taking for the 12/28/19 encounter (Telemedicine) with Antoine Poche, MD.     Allergies:   Ampicillin, Penicillins, and Statins   Social History   Tobacco Use  . Smoking status: Current Every Day Smoker    Packs/day: 1.00    Types: Cigarettes     Start date: 12/01/1972  . Smokeless tobacco: Never Used  . Tobacco comment: 1.5-2 packs per day  Substance Use Topics  . Alcohol use: No    Alcohol/week: 0.0 standard drinks  . Drug use: No     Family Hx: The patient's family history includes Coronary artery disease in his father and mother; Diabetes in his father.  ROS:   Please see the history of present illness.     All other systems reviewed and are negative.   Prior CV studies:   The following studies were reviewed today:  11/2006 Cath FINDINGS: Aortic pressure 112/71 with mean of 89. Left ventricular pressures 107/4 with an end-diastolic pressure of 16.  CORONARY ANGIOGRAPHY: The left main stem is angiographically normal and bifurcates into the LAD and left circumflex.  The LAD is a large-caliber vessel that courses down the left ventricular apex. It has no significant angiographic disease throughout it's course. There are two diagonal branches, the first of which is a large caliber. There is a 50% ostial stenosis of the first diagonal. The second diagonal is medium caliber and is angiographically normal.  The left circumflex system is medium caliber. It gives off two marginal branches. The first marginal is medium caliber and has a 50% ostial stenosis. There is normal flow throughout that vessel. Second obtuse marginal is angiographically normal. The true AV groove circumflex is small caliber.  The right coronary artery is a large dominant vessel. It is tortuous in its proximal portion. It gives off a large PDA Darcia Lampi distally. It gives off a posterior AV segment that has four posterolateral branches. The mid right coronary artery has a severe focal stenosis of the least 95%. There is an RV marginal Kennia Vanvorst that arises from this region of high-grade stenosis. There is TIMI III flow throughout the vessel.  Left ventriculogram demonstrates low normal left ventricular  function with an LVEF of 55%.  ASSESSMENT: 1. Severe single-vessel coronary artery disease with high-grade focal  stenosis of the mid-right coronary artery. 2. Normal left ventricular function.  PLAN: As described above, PCI of the mid-right coronary artery was performed with a Liberte bare metal stent. This was post-dilated with a 4-0 noncompliant balloon, with an excellent angiographic result. The RV marginal Lam Bjorklund did have compromised flow following the procedure. The RV marginal Laurina Fischl is approximately a 2.0-mm vessel, and I elected not to mechanically intervene on this vessel. Will place Mr.Thier on a nitroglycerin drip and will treat him medically. I do not think he will have long-term sequelae from his compromised RV marginal Kieren Adkison, and his right coronary artery is a very large vessel that I did not want to risk with treating the side Ki Corbo. He has been pretreated with clopidogrel. He should receive dual anti-platelet therapy with aspirin and clopidogrel. He needs indefinite aspirin and clopidogrel for a minimum  of 30 days. He would benefit from 1 year of clopidogrel, based on the cure data for non-stemi.  11/2006 Cath HEMODYNAMICS: AO 102/86.  CORONARIES: The left main was normal. The LAD had ostial 25% stenosis. There was a mid 25% stenosis. There was a moderate-size first diagonal which had ostial 25-30% stenosis. Second diagonal was small and normal. The circumflex essentially had a very large branching OM1. There was some proximal stenosis of 25%. The right coronary artery is a large dominant vessel. There were diffuse proximal luminal irregularities. There was a stented area across an acute marginal. This was widely patent. The acute marginal was also patent. There was a large PDA which was normal.  LEFT VENTRICLE: I did not cross the AO and did not do an LV injection.  CONCLUSION: Widely patent right  coronary artery stent.  PLAN: The etiology for the patient's chest pain is not clear. He will continue with secondary risk reduction and further evaluation by his primary care doctor.   10/2013 [facility] Cath   Equipment used:  1. 6 [place] JR 4 guide without sideholes  2. Short BMW wire  3. 2.5 x 12 mm trek balloon  4. 3.5 x 38 mm Promus premiere drug-eluting stent  5. 3.75 x 12 mm [place] trek balloon    Medications used:  1. Angiomax IV  2. Brilinta 180 mg p.o.  3. Intracoronary nitroglycerin    Description of procedure:    After diagnostic angiograms were performed a JR 4 guide was brought up   into the ostium of the right coronary artery provided adequate backup   support. The vessel was wired with a short BMW wire. A 2.5 x 12 mm trek   balloon was brought down and used to   predilate the lesion for a total of 40 seconds the maximum of 12   atmospheres with 2 inflations.    Next a 3.5 x 38 mm Promus premiere drug-eluting stent was deployed in the   proximal the mid right coronary artery at 16 atmospheres for 30 seconds.   Subsequently the stent was postdilated with a 3.75 x 12 mm [place] trek balloon   with 3 inflations a maximum of   18 atmospheres for a total of 60 seconds.    Finally the balloon and wire were removed. Final angiogram show no   evidence of stent malposition, vessel perforation, or dissection. There is   TIMI 3 flow down the vessel. The patient was chest pain-free.    A TR band was placed in the right wrist for hemostasis.    Conclusions  1. Successful PCI to the proximal right coronary artery as above with a   drug-eluting stent. Lesion length was 34 mm eccentric mild calcium no   thrombus. Lesion went from 80% pre-to 0% post residual. Pre-TIMI flow is   3, post TIMI flow is 3.  2. Right radial access.   Labs/Other Tests and Data Reviewed:    EKG:  No ECG reviewed.  Recent Labs: 01/11/2019: ALT 17;  Magnesium 1.9 06/07/2019: BUN 9; Creatinine, Ser 0.71; Hemoglobin 15.6; Platelets 134; Potassium 3.6; Sodium 140   Recent Lipid Panel Lab Results  Component Value Date/Time   CHOL 213 (H) 01/29/2017 11:22 PM   TRIG 806 (H) 01/29/2017 11:22 PM   HDL 29 (L) 01/29/2017 11:22 PM   CHOLHDL 7.3 01/29/2017 11:22 PM   LDLCALC UNABLE TO CALCULATE IF TRIGLYCERIDE OVER 400 mg/dL 99/37/1696 78:93 PM    Wt Readings from Last 3 Encounters:  12/28/19 160 lb (72.6 kg)  06/08/19 175 lb 11.3 oz (79.7 kg)  01/28/19 189 lb (85.7 kg)     Objective:    Vital Signs:  Ht 5' 10.5" (1.791 m)   Wt 160 lb (72.6 kg)   BMI 22.63 kg/m    Normal affect. Normal speech pattern and tone. Comfortable, no apparent distress. No audible signs of SOB or wheezing.   ASSESSMENT & PLAN:    1. CAD - no recent symptoms, continue current meds including DAPT at least until 05/2020  2. HTN -he is at goal, continue current meds  3. HL - continue statin  COVID-19 Education: The signs and symptoms of COVID-19 were discussed with the patient and how to seek care for testing (follow up with PCP or arrange E-visit).  The importance of social distancing was discussed today.  Time:   Today, I have spent 15 minutes with the patient with telehealth technology discussing the above problems.     Medication Adjustments/Labs and Tests Ordered: Current medicines are reviewed at length with the patient today.  Concerns regarding medicines are outlined above.   Tests Ordered: No orders of the defined types were placed in this encounter.   Medication Changes: No orders of the defined types were placed in this encounter.   Follow Up:  Either In Person or Virtual in 6 month(s)  Signed, Carlyle Dolly, MD  12/28/2019 8:11 AM    [place]

## 2020-02-25 ENCOUNTER — Other Ambulatory Visit: Payer: Self-pay | Admitting: Cardiology

## 2020-05-08 DEATH — deceased

## 2020-07-09 ENCOUNTER — Ambulatory Visit: Payer: Medicare Other | Admitting: Cardiology
